# Patient Record
Sex: Female | Born: 1935 | Race: Black or African American | Hispanic: No | Smoking: Former smoker
Health system: Southern US, Community
[De-identification: ages and names within clinical notes are randomized; demographics above are authoritative.]

## PROBLEM LIST (undated history)

## (undated) DIAGNOSIS — G4730 Sleep apnea, unspecified: Secondary | ICD-10-CM

## (undated) DIAGNOSIS — I2699 Other pulmonary embolism without acute cor pulmonale: Secondary | ICD-10-CM

## (undated) DIAGNOSIS — K219 Gastro-esophageal reflux disease without esophagitis: Secondary | ICD-10-CM

## (undated) DIAGNOSIS — Z8719 Personal history of other diseases of the digestive system: Secondary | ICD-10-CM

## (undated) DIAGNOSIS — D510 Vitamin B12 deficiency anemia due to intrinsic factor deficiency: Secondary | ICD-10-CM

## (undated) DIAGNOSIS — E7800 Pure hypercholesterolemia, unspecified: Secondary | ICD-10-CM

## (undated) HISTORY — DX: Pure hypercholesterolemia, unspecified: E78.00

## (undated) HISTORY — DX: Vitamin B12 deficiency anemia due to intrinsic factor deficiency: D51.0

## (undated) HISTORY — DX: Other pulmonary embolism without acute cor pulmonale: I26.99

---

## 1982-07-26 HISTORY — PX: ABDOMINAL HYSTERECTOMY: SHX81

## 2003-07-27 HISTORY — PX: CHOLECYSTECTOMY: SHX55

## 2011-06-18 ENCOUNTER — Encounter (HOSPITAL_BASED_OUTPATIENT_CLINIC_OR_DEPARTMENT_OTHER): Payer: Self-pay

## 2011-06-25 ENCOUNTER — Ambulatory Visit (HOSPITAL_BASED_OUTPATIENT_CLINIC_OR_DEPARTMENT_OTHER): Payer: Medicare Other

## 2011-07-30 ENCOUNTER — Other Ambulatory Visit (HOSPITAL_COMMUNITY)
Admission: RE | Admit: 2011-07-30 | Discharge: 2011-07-30 | Disposition: A | Discharge status: Home / Self Care | Payer: Medicare Other | Source: Ambulatory Visit | Attending: Family Medicine | Admitting: Family Medicine

## 2011-07-30 DIAGNOSIS — Z1159 Encounter for screening for other viral diseases: Secondary | ICD-10-CM | POA: Insufficient documentation

## 2011-07-30 DIAGNOSIS — Z124 Encounter for screening for malignant neoplasm of cervix: Secondary | ICD-10-CM | POA: Insufficient documentation

## 2011-09-23 ENCOUNTER — Other Ambulatory Visit: Payer: Self-pay | Admitting: Gastroenterology

## 2011-09-29 ENCOUNTER — Inpatient Hospital Stay: Admission: RE | Admit: 2011-09-29 | Payer: Medicare Other | Source: Ambulatory Visit

## 2011-10-04 ENCOUNTER — Other Ambulatory Visit: Payer: Medicare Other

## 2011-10-05 ENCOUNTER — Ambulatory Visit
Admission: RE | Admit: 2011-10-05 | Discharge: 2011-10-05 | Disposition: A | Discharge status: Home / Self Care | Payer: Medicare Other | Source: Ambulatory Visit | Attending: Gastroenterology | Admitting: Gastroenterology

## 2013-03-22 ENCOUNTER — Encounter: Payer: Self-pay | Admitting: *Deleted

## 2013-03-23 ENCOUNTER — Encounter: Payer: Self-pay | Admitting: Internal Medicine

## 2013-03-23 ENCOUNTER — Ambulatory Visit (INDEPENDENT_AMBULATORY_CARE_PROVIDER_SITE_OTHER): Payer: Medicare Other | Admitting: Internal Medicine

## 2013-03-23 VITALS — BP 94/78 | HR 72 | Temp 98.5°F | Resp 18 | Ht 66.14 in | Wt 169.5 lb

## 2013-03-23 DIAGNOSIS — K219 Gastro-esophageal reflux disease without esophagitis: Secondary | ICD-10-CM | POA: Insufficient documentation

## 2013-03-23 DIAGNOSIS — R143 Flatulence: Secondary | ICD-10-CM

## 2013-03-23 DIAGNOSIS — E785 Hyperlipidemia, unspecified: Secondary | ICD-10-CM | POA: Insufficient documentation

## 2013-03-23 DIAGNOSIS — R141 Gas pain: Secondary | ICD-10-CM

## 2013-03-23 DIAGNOSIS — E663 Overweight: Secondary | ICD-10-CM | POA: Insufficient documentation

## 2013-03-23 DIAGNOSIS — K449 Diaphragmatic hernia without obstruction or gangrene: Secondary | ICD-10-CM

## 2013-03-23 DIAGNOSIS — R634 Abnormal weight loss: Secondary | ICD-10-CM | POA: Insufficient documentation

## 2013-03-23 NOTE — Progress Notes (Signed)
Patient ID: Alicia Smith, female   DOB: 1935-12-04, 77 y.o.   MRN: [id number] Location:  [facility] / [place] [facility]  Code Status: does not have advance directive  No Known Allergies  Chief Complaint  Patient presents with  . Establish Care    HPI: Patient is a 77 y.o. black female seen in the office today to establish with the practice.  She has not had many medical problems.  She was started on amlodipine which she since stopped.    She uses zantac prn for acid reflux.  See hpi below.  Has bubble in stomach.  Present since H pylori in stomach and treated with abx and probiotics.  Cscope with diverticulosis in March.  Still there after March.    She refuses all of her vaccines--flu, pneumonia, tetanus, shingles.    She smokes marijuana.  Last time was 2 wks ago.  Every couple mos with her friends.    Review of Systems:  Review of Systems  Constitutional: Positive for weight loss.       6 lbs since last visit with her previous doctor a month ago  HENT: Negative for hearing loss.   Eyes:       Dry eyes, sees Dr. Burna Cash  Respiratory: Negative for shortness of breath.   Cardiovascular: Negative for chest pain.  Gastrointestinal: Positive for heartburn, abdominal pain and diarrhea. Negative for nausea, vomiting, constipation, blood in stool and melena.       "bad bacteria in stomach"--cscope with mild diverticulosis, was put on probiotics and abx, has hiatal hernia, feels like not digesting her food--stopped fried foods, stools are loose and feels gassy, tried the new dulcolax for gas, feels like a bubble in her stomach  Musculoskeletal: Negative for falls.  Neurological: Negative for dizziness.  Endo/Heme/Allergies: Does not bruise/bleed easily.  Psychiatric/Behavioral: Negative for depression and memory loss. The patient is not nervous/anxious and does not have insomnia.      Past Medical History  Diagnosis Date  . High cholesterol     Past Surgical  History  Procedure Laterality Date  . Abdominal hysterectomy  1984    Dr. Gaynell Face  . Cholecystectomy  [address]    [place], [place]  . Cesarean section  66    [place]    Social History:   reports that she quit smoking about 30 years ago. Her smoking use included Cigarettes. She smoked 0.00 packs per day. She has never used smokeless tobacco. She reports that  drinks alcohol. She reports that she uses illicit drugs (Marijuana).  Family History  Problem Relation Age of Onset  . Cancer Mother   . Cancer Father   . Cancer Sister     breast  . Diabetes Sister   . Hypertension Sister   . Cancer Brother     prostate  . Kidney disease Sister   . Heart disease Brother     Medications: Patient's Medications  New Prescriptions   No medications on file  Previous Medications   AMLODIPINE (NORVASC) 5 MG TABLET    Take 5 mg by mouth daily.   NAPROXEN SODIUM (ANAPROX) 220 MG TABLET    Take 220 mg by mouth as needed.   RANITIDINE (ZANTAC) 150 MG TABLET    Take 150 mg by mouth as needed for heartburn.  Modified Medications   No medications on file  Discontinued Medications   No medications on file     Physical Exam: Filed Vitals:   03/23/13 0827  BP: 94/78  Pulse: 72  Temp: 98.5 F (36.9 C)  TempSrc: Oral  Resp: 18  Height: 5' 6.14" (1.68 m)  Weight: 169 lb 8 oz (76.885 kg)  Physical Exam  Constitutional: She is oriented to person, place, and time. She appears well-developed and well-nourished. No distress.  HENT:  Head: Normocephalic and atraumatic.  Right Ear: External ear normal.  Left Ear: External ear normal.  Nose: Nose normal.  Mouth/Throat: Oropharynx is clear and moist. No oropharyngeal exudate.  Eyes: EOM are normal. Pupils are equal, round, and reactive to light.  Neck: Normal range of motion. No tracheal deviation present. No thyromegaly present.  Cardiovascular: Normal rate, regular rhythm, normal heart sounds and intact distal pulses.   Pulmonary/Chest: Effort  normal and breath sounds normal. No respiratory distress.  Abdominal: Soft. Bowel sounds are normal. She exhibits no distension and no mass. There is no tenderness. There is no rebound and no guarding.  Midline cicatrix from hysterectomy--has subcutaneous air just superior to this and makes a glug sound when it is palpated  Musculoskeletal: Normal range of motion. She exhibits no edema and no tenderness.  Neurological: She is alert and oriented to person, place, and time. No cranial nerve deficit.  Skin: Skin is warm and dry.  Psychiatric: She has a normal mood and affect. Her behavior is normal. Judgment and thought content normal.   Labs reviewed: No recent available until records received Past Procedures: Reviewed EGD, cscope  Assessment/Plan 1. Excessive flatus - previously felt to be related to h pylori and inadequate good flora -has stopped her probiotics -due to subcutaneous air and pocket that fills with air and becomes uncomfortable, will check ultrasound for diagnosis - US Abdomen Complete; Future  2. GERD (gastroesophageal reflux disease) - f/u to check for clearance of h pylori - H. pylori Antibody, IgG - cont prn zantac 3. Hiatal hernia with gastroesophageal reflux -known, contributes to flatus - US Abdomen Complete; Future 4. Loss of weight - has changed her diet considerably--eats no red meat or fried foods now, but has lost over 100 lbs (said she was in the upper 200s going on 300 at one point) -cscope was remarkable only for diverticulosis--no polyps or masses identified, also had EGD and had barium study unremarkable except small esophageal web - Comprehensive metabolic panel - CBC with Differential - TSH  5. Hyperlipidemia LDL goal < 100 - Lipid panel  6. Overweight - wanted to check hba1c due to this and weight loss, but insurance would not cover.  Labs/tests ordered: cbc, cmp, flp, tsh, h pylori ab, abd [place], obtain records from prior pcp Next appt: 1  month

## 2013-03-28 ENCOUNTER — Ambulatory Visit
Admission: RE | Admit: 2013-03-28 | Discharge: 2013-03-28 | Disposition: A | Discharge status: Home / Self Care | Payer: Medicare Other | Source: Ambulatory Visit | Attending: Internal Medicine | Admitting: Internal Medicine

## 2013-03-28 DIAGNOSIS — R143 Flatulence: Secondary | ICD-10-CM

## 2013-03-28 DIAGNOSIS — K219 Gastro-esophageal reflux disease without esophagitis: Secondary | ICD-10-CM

## 2013-03-28 DIAGNOSIS — R634 Abnormal weight loss: Secondary | ICD-10-CM

## 2013-03-28 LAB — COMPREHENSIVE METABOLIC PANEL
ALT: 5 IU/L (ref 0–32)
AST: 10 IU/L (ref 0–40)
Albumin/Globulin Ratio: 1.7 (ref 1.1–2.5)
Albumin: 4.2 g/dL (ref 3.5–4.8)
Alkaline Phosphatase: 55 IU/L (ref 39–117)
BUN/Creatinine Ratio: 14 (ref 11–26)
BUN: 19 mg/dL (ref 8–27)
CO2: 19 mmol/L (ref 18–29)
Calcium: 9.1 mg/dL (ref 8.6–10.2)
Chloride: 106 mmol/L (ref 97–108)
Creatinine, Ser: 1.34 mg/dL — ABNORMAL HIGH (ref 0.57–1.00)
GFR calc Af Amer: 45 mL/min/{1.73_m2} — ABNORMAL LOW (ref >59)
GFR calc non Af Amer: 39 mL/min/{1.73_m2} — ABNORMAL LOW (ref >59)
Globulin, Total: 2.5 g/dL (ref 1.5–4.5)
Glucose: 78 mg/dL (ref 65–99)
Potassium: 4 mmol/L (ref 3.5–5.2)
Sodium: 141 mmol/L (ref 134–144)
Total Bilirubin: 0.2 mg/dL (ref 0.0–1.2)
Total Protein: 6.7 g/dL (ref 6.0–8.5)

## 2013-03-28 LAB — CBC WITH DIFFERENTIAL/PLATELET
Basophils Absolute: 0 10*3/uL (ref 0.0–0.2)
Basos: 1 % (ref 0–3)
Eos: 3 % (ref 0–5)
Eosinophils Absolute: 0.2 10*3/uL (ref 0.0–0.4)
HCT: 36.8 % (ref 34.0–46.6)
Hemoglobin: 11.9 g/dL (ref 11.1–15.9)
Immature Grans (Abs): 0 10*3/uL (ref 0.0–0.1)
Immature Granulocytes: 0 % (ref 0–2)
Lymphocytes Absolute: 2.3 10*3/uL (ref 0.7–3.1)
Lymphs: 41 % (ref 14–46)
MCH: 26.4 pg — ABNORMAL LOW (ref 26.6–33.0)
MCHC: 32.3 g/dL (ref 31.5–35.7)
MCV: 82 fL (ref 79–97)
Monocytes Absolute: 0.3 10*3/uL (ref 0.1–0.9)
Monocytes: 6 % (ref 4–12)
Neutrophils Absolute: 2.8 10*3/uL (ref 1.4–7.0)
Neutrophils Relative %: 49 % (ref 40–74)
RBC: 4.51 x10E6/uL (ref 3.77–5.28)
RDW: 15.9 % — ABNORMAL HIGH (ref 12.3–15.4)
WBC: 5.6 10*3/uL (ref 3.4–10.8)

## 2013-03-28 LAB — LIPID PANEL
Chol/HDL Ratio: 3.7 ratio units (ref 0.0–4.4)
Cholesterol, Total: 154 mg/dL (ref 100–199)
HDL: 42 mg/dL (ref >39)
LDL Calculated: 94 mg/dL (ref 0–99)
Triglycerides: 92 mg/dL (ref 0–149)
VLDL Cholesterol Cal: 18 mg/dL (ref 5–40)

## 2013-03-28 LAB — TSH: TSH: 1.88 u[IU]/mL (ref 0.450–4.500)

## 2013-03-28 LAB — H. PYLORI ANTIBODY, IGG: H Pylori IgG: 2 U/mL — ABNORMAL HIGH (ref 0.0–0.8)

## 2013-03-29 ENCOUNTER — Telehealth: Payer: Self-pay

## 2013-03-29 MED ORDER — AMOXICILL-CLARITHRO-LANSOPRAZ PO MISC: Freq: Two times a day (BID) | ORAL | Status: DC

## 2013-03-29 NOTE — Telephone Encounter (Signed)
Discussed labs with patient, patient verbalized understanding of results. Patient with pending appointment to f/u on 04/23/2013, RX sent to pharmacy

## 2013-03-29 NOTE — Telephone Encounter (Signed)
Message copied by Maurice Small on Thu Mar 29, 2013  2:04 PM ------      Message from: [place], [place] L      Created: Thu Mar 29, 2013  1:02 PM       H Pylori stomach bacteria test remains elevated despite prior treatment.  Will reinitiate this due to her ongoing stomach problems.  She needs to complete the entire treatment and not miss doses in order for it to work properly--prevpac (lansoprazole/amoxicillin/clarithromycin) 1 dose twice a day for 14 days as in pac instructions.  Avoid probiotics while taking this. ------

## 2013-04-03 ENCOUNTER — Telehealth: Payer: Self-pay

## 2013-04-03 MED ORDER — CLARITHROMYCIN 500 MG PO TABS: 500.0000 mg | ORAL_TABLET | Freq: Two times a day (BID) | ORAL | Status: DC

## 2013-04-03 MED ORDER — LANSOPRAZOLE 30 MG PO CPDR: 30.0000 mg | DELAYED_RELEASE_CAPSULE | Freq: Two times a day (BID) | ORAL | Status: DC

## 2013-04-03 MED ORDER — AMOXICILLIN 500 MG PO TABS: ORAL_TABLET | ORAL | Status: DC

## 2013-04-03 NOTE — Telephone Encounter (Signed)
Left message on voicemail informing patient rx's sent in individually.

## 2013-04-03 NOTE — Telephone Encounter (Signed)
The individual meds can all be provided separately but taken like a prevpak.  I don't know if they will be covered individually either.

## 2013-04-03 NOTE — Telephone Encounter (Signed)
Patient called indicating that she is still unable to get Prevpac [employer] will not cover), patient would like to know if Dr.Reed will change her to something else that will cure H.Pylori. Patient indicates in the past when Dr.Mann (GI doctor) rx'ed Prevpak she had the same situation where the insurance company would not cover rx. Please advise

## 2013-04-12 ENCOUNTER — Telehealth: Payer: Self-pay | Admitting: *Deleted

## 2013-04-12 NOTE — Telephone Encounter (Signed)
Patient Notified

## 2013-04-12 NOTE — Telephone Encounter (Signed)
Patient called and stated that she is burning in her vagina area and it is from taking the antibiotics. She stated that she does this everytime she takes them.

## 2013-04-23 ENCOUNTER — Ambulatory Visit (INDEPENDENT_AMBULATORY_CARE_PROVIDER_SITE_OTHER): Payer: Medicare Other | Admitting: Internal Medicine

## 2013-04-23 ENCOUNTER — Encounter: Payer: Self-pay | Admitting: Internal Medicine

## 2013-04-23 VITALS — BP 108/70 | HR 79 | Temp 98.3°F | Wt 163.0 lb

## 2013-04-23 DIAGNOSIS — K449 Diaphragmatic hernia without obstruction or gangrene: Secondary | ICD-10-CM

## 2013-04-23 DIAGNOSIS — K219 Gastro-esophageal reflux disease without esophagitis: Secondary | ICD-10-CM

## 2013-04-23 DIAGNOSIS — K2970 Gastritis, unspecified, without bleeding: Secondary | ICD-10-CM

## 2013-04-23 DIAGNOSIS — R634 Abnormal weight loss: Secondary | ICD-10-CM

## 2013-04-23 MED ORDER — LANSOPRAZOLE 30 MG PO CPDR: 30.0000 mg | DELAYED_RELEASE_CAPSULE | Freq: Every day | ORAL | Status: DC

## 2013-04-23 NOTE — Progress Notes (Signed)
Patient ID: Alicia Smith, female   DOB: [birth year], 77 y.o.   MRN: [id number] Location:  [facility] / [facility]  No Known Allergies  Chief Complaint  Patient presents with  . Medical Managment of Chronic Issues    1 month follow-up  . Weight Loss    pt down 6 lbs from last visit 1 month ago  . GI Problem    ongoing concerns- H-Pylori  . Leg Problem    knots/raised areas on RT leg    HPI: Patient is a 77 y.o. black female seen in the office today for medical management of chronic issues.   Pt. Ate breakfast yesterday morning and took her Prilosec then for lunch she had [employer] grilled chicken and it made her sick and she felt better after she threw up. This has happened twice this month. Feels that her food isn't digesting. Pt. Took all her medicine that was prescribed with prevpak.  After starting medication she had a BM and it was solid but now its is back to loose. Air bubbles that used to be in her stomach is getting better. Considered that she has continue to lose weight. Has lost 6lbs in the last month. States that her appetite is good except for the two times she vomited. States she can tell when her food isn't digesting because she belches and her stomach swells like a basketball. This do not happen every time she eats. States she when she ate tomatoes it made her stomach burn. Stomach doesn't hurt every time she eats.    Review of Systems:  Review of Systems  Constitutional: Positive for weight loss.  Respiratory: Negative for shortness of breath.   Cardiovascular: Negative for chest pain.  Gastrointestinal: Positive for vomiting, abdominal pain and diarrhea. Negative for blood in stool.       See HPI  Psychiatric/Behavioral: Negative for depression.    Past Medical History  Diagnosis Date  . High cholesterol     Past Surgical History  Procedure Laterality Date  . Abdominal hysterectomy  1984    Dr. Gaynell Face  . Cholecystectomy  [address]    [place], [place]  . Cesarean section  15    [place]    Social History:   reports that she quit smoking about 30 years ago. Her smoking use included Cigarettes. She smoked 0.00 packs per day. She has never used smokeless tobacco. She reports that  drinks alcohol. She reports that she uses illicit drugs (Marijuana).  Family History  Problem Relation Age of Onset  . Cancer Mother   . Cancer Father   . Cancer Sister     breast  . Diabetes Sister   . Hypertension Sister   . Cancer Brother     prostate  . Kidney disease Sister   . Heart disease Brother     Medications: Patient's Medications  New Prescriptions   No medications on file  Previous Medications   AMLODIPINE (NORVASC) 5 MG TABLET    Take 5 mg by mouth daily.   LANSOPRAZOLE (PREVACID) 30 MG CAPSULE    Take 1 capsule (30 mg total) by mouth 2 (two) times daily.   NAPROXEN SODIUM (ANAPROX) 220 MG TABLET    Take 220 mg by mouth as needed.  Modified Medications   No medications on file  Discontinued Medications   AMOXICILLIN (AMOXIL) 500 MG TABLET    2 by mouth twice daily   CLARITHROMYCIN (BIAXIN) 500 MG TABLET  Take 1 tablet (500 mg total) by mouth 2 (two) times daily.   RANITIDINE (ZANTAC) 150 MG TABLET    Take 150 mg by mouth as needed for heartburn.     Physical Exam: Filed Vitals:   04/23/13 1029  BP: 108/70  Pulse: 79  Temp: 98.3 F (36.8 C)  TempSrc: Oral  Weight: 163 lb (73.936 kg)  SpO2: 99%   Physical Exam  Constitutional: She is oriented to person, place, and time. She appears well-developed and well-nourished.  Cardiovascular: Normal rate, regular rhythm, normal heart sounds and intact distal pulses.   Pulmonary/Chest: Effort normal and breath sounds normal.  Abdominal: Soft. Bowel sounds are normal. She exhibits no distension and no mass. There is no tenderness.  Neurological: She is alert and oriented to person, place, and time.  Skin: Skin is warm and dry.  Psychiatric: She has a normal mood and  affect.   Labs reviewed: Basic Metabolic Panel:  Recent Labs  96/04/54 1035  NA 141  K 4.0  CL 106  CO2 19  GLUCOSE 78  BUN 19  CREATININE 1.34*  CALCIUM 9.1  TSH 1.880   Liver Function Tests:  Recent Labs  03/23/13 1035  AST 10  ALT 5  ALKPHOS 55  BILITOT 0.2  PROT 6.7  CBC:  Recent Labs  03/23/13 1035  WBC 5.6  NEUTROABS 2.8  HGB 11.9  HCT 36.8  MCV 82   Lipid Panel:  Recent Labs  03/23/13 1035  HDL 42  LDLCALC 94  TRIG 92  CHOLHDL 3.7   Assessment/Plan 1. Gastritis - has improved significantly since treatment with prev-pak, but still has episodes of diarrhea and severe indigestion (these seem appropriate based on eating the wrong things--[employer] and then tomatoes) - f/u  H. pylori Antibody, IgG post treatment - Ambulatory referral to Gastroenterology--for repeat EGD--does not want to return to Dr. Loreta Ave  2. Loss of weight - Ambulatory referral to Gastroenterology - this may simply be due to her dramatic changes in her diet (much healthier now); however, I don't want to miss something serious so will r/o myeloma with spep, upep due to elevated creatinine - Protein electrophoresis, serum - Protein Electrophoresis, Urine Rflx.  3. Hiatal hernia with gastroesophageal reflux - Ambulatory referral to Gastroenterology  Labs/tests ordered: h pylori retest, spep, upep Next appt:  3 mos

## 2013-04-25 LAB — PROTEIN ELECTROPHORESIS, SERUM
A/G Ratio: 1.3 (ref 0.7–2.0)
Albumin ELP: 4.3 g/dL (ref 3.2–5.6)
Alpha 1: 0.2 g/dL (ref 0.1–0.4)
Alpha 2: 0.8 g/dL (ref 0.4–1.2)
Beta: 0.8 g/dL (ref 0.6–1.3)
Gamma Globulin: 1.5 g/dL (ref 0.5–1.6)
Globulin, Total: 3.3 g/dL (ref 2.0–4.5)
M-Spike, %: 0.7 g/dL — ABNORMAL HIGH
Total Protein: 7.6 g/dL (ref 6.0–8.5)

## 2013-04-25 LAB — PROTEIN ELECTROPHORESIS, URINE REFLEX
Albumin ELP, Urine: 16.3 %
Alpha-1-Globulin, U: 1.4 %
Alpha-2-Globulin, U: 10.5 %
Beta Globulin, U: 32.4 %
Gamma Globulin, U: 39.5 %
Protein, Ur: 30 mg/dL — ABNORMAL HIGH (ref 0.0–15.0)

## 2013-04-25 LAB — H. PYLORI ANTIBODY, IGG: H Pylori IgG: 2.1 U/mL — ABNORMAL HIGH (ref 0.0–0.8)

## 2013-04-27 ENCOUNTER — Telehealth: Payer: Self-pay | Admitting: *Deleted

## 2013-04-27 NOTE — Telephone Encounter (Signed)
Patient states that she is having burning in her vaginal and rectal area.

## 2013-04-27 NOTE — Telephone Encounter (Signed)
I recommend she try over the counter yeast infection treatment like monistat to see if this improves her symptoms over the weekend.  It may take a week to get better.

## 2013-04-27 NOTE — Telephone Encounter (Signed)
Discussed with patient, patient verbalized understanding. Patient aware if symptoms persist or progress to schedule appointment for evaluation.

## 2013-05-02 ENCOUNTER — Other Ambulatory Visit: Payer: Self-pay | Admitting: Gastroenterology

## 2013-05-02 DIAGNOSIS — R140 Abdominal distension (gaseous): Secondary | ICD-10-CM

## 2013-05-02 DIAGNOSIS — R634 Abnormal weight loss: Secondary | ICD-10-CM

## 2013-05-09 ENCOUNTER — Other Ambulatory Visit: Payer: Medicare Other

## 2013-05-15 ENCOUNTER — Ambulatory Visit
Admission: RE | Admit: 2013-05-15 | Discharge: 2013-05-15 | Disposition: A | Discharge status: Home / Self Care | Payer: Medicare Other | Source: Ambulatory Visit | Attending: Gastroenterology | Admitting: Gastroenterology

## 2013-05-15 ENCOUNTER — Inpatient Hospital Stay (HOSPITAL_COMMUNITY)
Admission: EM | Admit: 2013-05-15 | Discharge: 2013-05-19 | DRG: 395 | Disposition: A | Discharge status: Home / Self Care | Payer: Medicare Other | Attending: General Surgery | Admitting: General Surgery

## 2013-05-15 ENCOUNTER — Encounter (HOSPITAL_COMMUNITY): Payer: Self-pay | Admitting: Emergency Medicine

## 2013-05-15 DIAGNOSIS — K3189 Other diseases of stomach and duodenum: Secondary | ICD-10-CM | POA: Diagnosis present

## 2013-05-15 DIAGNOSIS — R140 Abdominal distension (gaseous): Secondary | ICD-10-CM

## 2013-05-15 DIAGNOSIS — E7800 Pure hypercholesterolemia, unspecified: Secondary | ICD-10-CM | POA: Diagnosis present

## 2013-05-15 DIAGNOSIS — K219 Gastro-esophageal reflux disease without esophagitis: Secondary | ICD-10-CM | POA: Diagnosis present

## 2013-05-15 DIAGNOSIS — R634 Abnormal weight loss: Secondary | ICD-10-CM

## 2013-05-15 DIAGNOSIS — K668 Other specified disorders of peritoneum: Secondary | ICD-10-CM

## 2013-05-15 DIAGNOSIS — R197 Diarrhea, unspecified: Secondary | ICD-10-CM | POA: Diagnosis present

## 2013-05-15 DIAGNOSIS — Z87891 Personal history of nicotine dependence: Secondary | ICD-10-CM

## 2013-05-15 HISTORY — DX: Sleep apnea, unspecified: G47.30

## 2013-05-15 HISTORY — DX: Gastro-esophageal reflux disease without esophagitis: K21.9

## 2013-05-15 HISTORY — DX: Personal history of other diseases of the digestive system: Z87.19

## 2013-05-15 LAB — CBC WITH DIFFERENTIAL/PLATELET
Basophils Absolute: 0 10*3/uL (ref 0.0–0.1)
Basophils Relative: 1 % (ref 0–1)
Eosinophils Absolute: 0.1 10*3/uL (ref 0.0–0.7)
HCT: 34.3 % — ABNORMAL LOW (ref 36.0–46.0)
Hemoglobin: 11.2 g/dL — ABNORMAL LOW (ref 12.0–15.0)
Lymphs Abs: 2.4 10*3/uL (ref 0.7–4.0)
MCH: 26.8 pg (ref 26.0–34.0)
MCHC: 32.7 g/dL (ref 30.0–36.0)
MCV: 82.1 fL (ref 78.0–100.0)
Monocytes Absolute: 0.3 10*3/uL (ref 0.1–1.0)
Monocytes Relative: 6 % (ref 3–12)
Neutro Abs: 2.9 10*3/uL (ref 1.7–7.7)
Neutrophils Relative %: 50 % (ref 43–77)
RBC: 4.18 MIL/uL (ref 3.87–5.11)
RDW: 15.5 % (ref 11.5–15.5)
WBC: 5.7 10*3/uL (ref 4.0–10.5)

## 2013-05-15 LAB — COMPREHENSIVE METABOLIC PANEL
AST: 16 U/L (ref 0–37)
Albumin: 3.6 g/dL (ref 3.5–5.2)
BUN: 16 mg/dL (ref 6–23)
Calcium: 8.9 mg/dL (ref 8.4–10.5)
Chloride: 106 mEq/L (ref 96–112)
Creatinine, Ser: 1.02 mg/dL (ref 0.50–1.10)
Total Bilirubin: 0.3 mg/dL (ref 0.3–1.2)
Total Protein: 7 g/dL (ref 6.0–8.3)

## 2013-05-15 MED ORDER — DEXTROSE-NACL 5-0.9 % IV SOLN
INTRAVENOUS | Status: DC
  Administered 2013-05-15: 75 mL via INTRAVENOUS

## 2013-05-15 MED ORDER — ENOXAPARIN SODIUM 40 MG/0.4ML ~~LOC~~ SOLN
40.0000 mg | SUBCUTANEOUS | Status: DC
  Administered 2013-05-15 – 2013-05-18 (×4): 40 mg via SUBCUTANEOUS
  Filled 2013-05-15 (×6): qty 0.4

## 2013-05-15 MED ORDER — LIDOCAINE HCL 2 % EX GEL
Freq: Once | CUTANEOUS | Status: AC
  Administered 2013-05-15: 20
  Filled 2013-05-15: qty 20

## 2013-05-15 MED ORDER — MORPHINE SULFATE 2 MG/ML IJ SOLN
2.0000 mg | INTRAMUSCULAR | Status: DC | PRN
  Filled 2013-05-15: qty 1

## 2013-05-15 MED ORDER — ONDANSETRON HCL 4 MG/2ML IJ SOLN
4.0000 mg | Freq: Four times a day (QID) | INTRAMUSCULAR | Status: DC | PRN
  Filled 2013-05-15: qty 2

## 2013-05-15 MED ORDER — PANTOPRAZOLE SODIUM 40 MG IV SOLR
40.0000 mg | Freq: Every day | INTRAVENOUS | Status: DC
  Administered 2013-05-15 – 2013-05-18 (×4): 40 mg via INTRAVENOUS
  Filled 2013-05-15 (×6): qty 40

## 2013-05-15 MED ORDER — IOHEXOL 300 MG/ML  SOLN
100.0000 mL | Freq: Once | INTRAMUSCULAR | Status: AC | PRN
  Administered 2013-05-15: 100 mL via INTRAVENOUS

## 2013-05-15 NOTE — H&P (Signed)
Chief Complaint: peritoneal free air on CT scan  HPI: Alicia Smith is a 77 year old female who presented to [facility] following an outpatient CT scan that was ordered by her gastroenterologist whom she saw last week. Due to findings of free air, she was referred to the ED. The patient states that over 1 year she has been having reflux type symptoms and "gurgling" or "bubbling" which she can feel move when she pushes on the abdomen.  She had an upper endoscopy with Dr. Loreta Ave which was negative.  She was treated for H. Pylori with a prev pack and started on zantac.  She had a colonoscopy in April which she states was normal once again.  Due to ongoing symptoms, she sought a second opinion.  She was seen by Dr. Bosie Clos about 1 week ago.  The patient denies abdominal pain.  Denies fever, chills or sweats.  She complains of loose stools.  Her appetite is adequate, but reports 100lb weight loss over the past year.She denies significant family history such as gastric or colon cancer.  She does not take any blood thinners. Previous abdominal history includes a laparoscopic cholecystectomy in 2005, hysterectomy in 1984 for uterine fibroids and a c-section.     Past Medical History  Diagnosis Date  . High cholesterol     Past Surgical History  Procedure Laterality Date  . Abdominal hysterectomy  1984    Dr. Gaynell Face  . Cholecystectomy  [address]    [place], [place]  . Cesarean section  3    [place]    Family History  Problem Relation Age of Onset  . Cancer Mother   . Cancer Father   . Cancer Sister     breast  . Diabetes Sister   . Hypertension Sister   . Cancer Brother     prostate  . Kidney disease Sister   . Heart disease Brother    Social History:  reports that she quit smoking about 30 years ago. Her smoking use included Cigarettes. She smoked 0.00 packs per day. She has never used smokeless tobacco. She reports that she drinks alcohol. She reports that she uses illicit drugs  (Marijuana).  Allergies: No Known Allergies   (Not in a hospital admission)  Results for orders placed during the hospital encounter of 05/15/13 (from the past 48 hour(s))  CBC WITH DIFFERENTIAL     Status: Abnormal   Collection Time    05/15/13  1:52 PM      Result Value Range   WBC 5.7  4.0 - 10.5 K/uL   RBC 4.18  3.87 - 5.11 MIL/uL   Hemoglobin 11.2 (*) 12.0 - 15.0 g/dL   HCT 13.0 (*) 86.5 - 78.4 %   MCV 82.1  78.0 - 100.0 fL   MCH 26.8  26.0 - 34.0 pg   MCHC 32.7  30.0 - 36.0 g/dL   RDW 69.6  29.5 - 28.4 %   Platelets 158  150 - 400 K/uL   Neutrophils Relative % 50  43 - 77 %   Neutro Abs 2.9  1.7 - 7.7 K/uL   Lymphocytes Relative 42  12 - 46 %   Lymphs Abs 2.4  0.7 - 4.0 K/uL   Monocytes Relative 6  3 - 12 %   Monocytes Absolute 0.3  0.1 - 1.0 K/uL   Eosinophils Relative 2  0 - 5 %   Eosinophils Absolute 0.1  0.0 - 0.7 K/uL   Basophils Relative 1  0 -  1 %   Basophils Absolute 0.0  0.0 - 0.1 K/uL  COMPREHENSIVE METABOLIC PANEL     Status: Abnormal   Collection Time    05/15/13  1:52 PM      Result Value Range   Sodium 139  135 - 145 mEq/L   Potassium 3.4 (*) 3.5 - 5.1 mEq/L   Chloride 106  96 - 112 mEq/L   CO2 21  19 - 32 mEq/L   Glucose, Bld 85  70 - 99 mg/dL   BUN 16  6 - 23 mg/dL   Creatinine, Ser 1.19  0.50 - 1.10 mg/dL   Calcium 8.9  8.4 - 14.7 mg/dL   Total Protein 7.0  6.0 - 8.3 g/dL   Albumin 3.6  3.5 - 5.2 g/dL   AST 16  0 - 37 U/L   ALT 9  0 - 35 U/L   Alkaline Phosphatase 57  39 - 117 U/L   Total Bilirubin 0.3  0.3 - 1.2 mg/dL   GFR calc non Af Amer 52 (*) >90 mL/min   GFR calc Af Amer 61 (*) >90 mL/min   Comment: (NOTE)     The eGFR has been calculated using the CKD EPI equation.     This calculation has not been validated in all clinical situations.     eGFR's persistently <90 mL/min signify possible Chronic Kidney     Disease.   Ct Abdomen Pelvis W Contrast  05/15/2013   CLINICAL DATA:  Weight loss, bloating, history of H pylori  EXAM: CT  ABDOMEN AND PELVIS WITH CONTRAST  TECHNIQUE: Multidetector CT imaging of the abdomen and pelvis was performed using the standard protocol following bolus administration of intravenous contrast.  CONTRAST:  OMNIPAQUE IOHEXOL 300 MG/ML  SOLN  COMPARISON:  None.  BUN and creatinine were obtained on site at [facility] at [address]Results: BUN 15 mg/dL, Creatinine 1.0 mg/dL.  FINDINGS: The lung bases are clear other than a small bleb in the posterior right lower lobe. However, there is a large amount of free intraperitoneal air present. This amount of air does suggest the possibility of a perforated viscus. However, the patient is not in acute pain according to the CT technologist. The liver enhances with no focal abnormality and no ductal dilatation is seen. Surgical clips are present from prior cholecystectomy. There is a small amount of ascites primarily perihepatic in location. The pancreas is normal in size and the pancreatic duct is not dilated. The adrenal glands and spleen are unremarkable, with a small amount of perisplenic fluid as well. The stomach is not well distended, but distally there is suggestion of a soft tissue mass within the antrum of the stomach. However, on kidney delayed images, this "mass" does not persist and therefore a gastric ulceration and perforation would be the primary consideration in view of the amount of free air present. Endoscopy therefore is recommended.  The kidneys enhance and there is a cyst emanating from the lateral left kidney of approximately 6.4 cm in maximum diameter. On delayed images, the pelvocaliceal systems are unremarkable and the proximal ureters are normal in caliber. The abdominal aorta is normal in caliber. The mesenteric vasculature appears patent. No definite adenopathy is seen.  Free air continues into the pelvis and does enter the umbilicus. There is some free fluid layering within the pelvis as well. A large amount of air is noted in  the rectum, and there are multiple diverticula present. A perforated diverticulum could  create free air, but this amount of free air which suggest a gastric etiology as the more likely possibility. Contrast fills much of the small bowel which is unremarkable. Some of the distal small bowel loops are slightly prominent but no definite point of obstruction is noted. The urinary bladder is decompressed. The lumbar vertebrae are normal alignment.  IMPRESSION: 1. Large amount of free intraperitoneal air suggesting a perforated viscus. The patient is not at this time complaining of significant abdominal pain. 2. Initially there was a question of a possible soft tissue mass in the antrum of the stomach, but this does not appear to persist on delayed images. However in view of the amount of free air, a perforated gastric ulcer or tumor would be the primary consideration. Consider endoscopy. 3. Multiple colonic diverticula primarily in the rectosigmoid and descending colon. No definite diverticulitis is seen. 4. Small to moderate amount of ascites within the peritoneal cavity.   Electronically Signed   By: Dwyane Dee M.D.   On: 05/15/2013 11:56    Review of Systems  Constitutional: Positive for weight loss. Negative for fever, chills, malaise/fatigue and diaphoresis.  Respiratory: Negative for shortness of breath.   Cardiovascular: Negative for chest pain and palpitations.  Gastrointestinal: Negative for heartburn, nausea, vomiting, abdominal pain, diarrhea, constipation, blood in stool and melena.  Neurological: Negative for weakness.    Blood pressure 129/72, pulse 87, temperature 97.7 F (36.5 C), temperature source Oral, resp. rate 18, height 5\' 6"  (1.676 m), weight 170 lb (77.111 kg), SpO2 97.00%. Physical Exam  Constitutional: She is oriented to person, place, and time. She appears well-developed and well-nourished. No distress.  Neck: Normal range of motion. Neck supple.  Cardiovascular: Normal rate,  regular rhythm, normal heart sounds and intact distal pulses.  Exam reveals no gallop and no friction rub.   No murmur heard. Respiratory: Effort normal and breath sounds normal. No respiratory distress. She has no wheezes. She has no rales. She exhibits no tenderness.  GI: Soft. Bowel sounds are normal. She exhibits no distension and no mass. There is no tenderness. There is no rebound and no guarding.  Musculoskeletal: She exhibits no edema and no tenderness.  Lymphadenopathy:    She has no cervical adenopathy.  Neurological: She is alert and oriented to person, place, and time.  Skin: Skin is warm and dry. No rash noted. She is not diaphoretic. No erythema. No pallor.  Psychiatric: She has a normal mood and affect. Her behavior is normal. Judgment and thought content normal.     Assessment/Plan Peritoneal free air: etiology is unclear at present time.  She has a benign abdominal exam, without guarding or evidence of peritonitis.  She has stable vital signs and a normal white count. With this, there are no indications for urgent surgical intervention.  We will admit her for serial abdominal exam, NGT for decompression and IV hydration.  Start PPI.  There are no indications for antibiotics as well. We will repeat a CBC, BMP and abdominal X ray in the morning.  Dr. Johna Sheriff has spoken with Dr. Bosie Clos who will evaluate the patient and determine whether an endoscopy is indicated.   She will remain NPO.  We discussed this with the patient and she is agreeable with such plan of care.    Ashok Norris ANP-BC Pager [phone]  05/15/2013, 2:36 PM

## 2013-05-15 NOTE — ED Notes (Signed)
Pt reports that she had a CT done at [facility] and was told that she had free air in her abd. Reports that she has had some mild abd pain but reports gas and a bubbling in her stomach. States that the GI MD did a Upper GI on her about a year ago. Reports that Bosie Clos is her GI MD at this time.

## 2013-05-15 NOTE — ED Provider Notes (Signed)
CSN: [id number]     Arrival date & time 05/15/13  1257 History   First MD Initiated Contact with Patient 05/15/13 1307     Chief Complaint  Patient presents with  . Abdominal Pain   (Consider location/radiation/quality/duration/timing/severity/associated sxs/prior Treatment) Patient is a 77 y.o. female presenting with abdominal pain.  Abdominal Pain  Pt with history of H. Pylori infection who finished [facility] in the Spring of 2014 reports she has had several months of what she describes as bubbles gurgling in her abdomen and loose stools but no true pain. No fever and no vomiting. She had EGD and colonoscopy done in the Spring but states symptoms had been going on prior to that. She was sent for [place] recently which was unrevealing and the for outpatient CT today with results below showing large amount of free air of unclear etiology.   Past Medical History  Diagnosis Date  . High cholesterol    Past Surgical History  Procedure Laterality Date  . Abdominal hysterectomy  1984    Dr. Gaynell Face  . Cholecystectomy  [address]    [place], [place]  . Cesarean section  42    [place]   Family History  Problem Relation Age of Onset  . Cancer Mother   . Cancer Father   . Cancer Sister     breast  . Diabetes Sister   . Hypertension Sister   . Cancer Brother     prostate  . Kidney disease Sister   . Heart disease Brother    History  Substance Use Topics  . Smoking status: Former Smoker    Types: Cigarettes    Quit date: 07/26/1982  . Smokeless tobacco: Never Used  . Alcohol Use: Yes     Comment: Social   OB History   Grav Para Term Preterm Abortions TAB SAB Ect Mult Living                 Review of Systems  Gastrointestinal: Positive for abdominal pain.   All other systems reviewed and are negative except as noted in HPI.   Allergies  Review of patient's allergies indicates no known allergies.  Home Medications  No current outpatient prescriptions on file. BP 129/72  Pulse  87  Temp(Src) 97.7 F (36.5 C) (Oral)  Resp 18  Ht 5\' 6"  (1.676 m)  Wt 170 lb (77.111 kg)  BMI 27.45 kg/m2  SpO2 97% Physical Exam  Nursing note and vitals reviewed. Constitutional: She is oriented to person, place, and time. She appears well-developed and well-nourished.  HENT:  Head: Normocephalic and atraumatic.  Eyes: EOM are normal. Pupils are equal, round, and reactive to light.  Neck: Normal range of motion. Neck supple.  Cardiovascular: Normal rate, normal heart sounds and intact distal pulses.   Pulmonary/Chest: Effort normal and breath sounds normal.  Abdominal: Bowel sounds are normal. She exhibits no distension. There is no tenderness. There is no rebound and no guarding.  Musculoskeletal: Normal range of motion. She exhibits no edema and no tenderness.  Neurological: She is alert and oriented to person, place, and time. She has normal strength. No cranial nerve deficit or sensory deficit.  Skin: Skin is warm and dry. No rash noted.  Psychiatric: She has a normal mood and affect.    ED Course  Procedures (including critical care time) Labs Review Labs Reviewed  CBC WITH DIFFERENTIAL  COMPREHENSIVE METABOLIC PANEL   Imaging Review Ct Abdomen Pelvis W Contrast  05/15/2013   CLINICAL DATA:  Weight loss, bloating, history of H pylori  EXAM: CT ABDOMEN AND PELVIS WITH CONTRAST  TECHNIQUE: Multidetector CT imaging of the abdomen and pelvis was performed using the standard protocol following bolus administration of intravenous contrast.  CONTRAST:  OMNIPAQUE IOHEXOL 300 MG/ML  SOLN  COMPARISON:  None.  BUN and creatinine were obtained on site at [facility] at [address]Results: BUN 15 mg/dL, Creatinine 1.0 mg/dL.  FINDINGS: The lung bases are clear other than a small bleb in the posterior right lower lobe. However, there is a large amount of free intraperitoneal air present. This amount of air does suggest the possibility of a perforated viscus. However,  the patient is not in acute pain according to the CT technologist. The liver enhances with no focal abnormality and no ductal dilatation is seen. Surgical clips are present from prior cholecystectomy. There is a small amount of ascites primarily perihepatic in location. The pancreas is normal in size and the pancreatic duct is not dilated. The adrenal glands and spleen are unremarkable, with a small amount of perisplenic fluid as well. The stomach is not well distended, but distally there is suggestion of a soft tissue mass within the antrum of the stomach. However, on kidney delayed images, this "mass" does not persist and therefore a gastric ulceration and perforation would be the primary consideration in view of the amount of free air present. Endoscopy therefore is recommended.  The kidneys enhance and there is a cyst emanating from the lateral left kidney of approximately 6.4 cm in maximum diameter. On delayed images, the pelvocaliceal systems are unremarkable and the proximal ureters are normal in caliber. The abdominal aorta is normal in caliber. The mesenteric vasculature appears patent. No definite adenopathy is seen.  Free air continues into the pelvis and does enter the umbilicus. There is some free fluid layering within the pelvis as well. A large amount of air is noted in the rectum, and there are multiple diverticula present. A perforated diverticulum could create free air, but this amount of free air which suggest a gastric etiology as the more likely possibility. Contrast fills much of the small bowel which is unremarkable. Some of the distal small bowel loops are slightly prominent but no definite point of obstruction is noted. The urinary bladder is decompressed. The lumbar vertebrae are normal alignment.  IMPRESSION: 1. Large amount of free intraperitoneal air suggesting a perforated viscus. The patient is not at this time complaining of significant abdominal pain. 2. Initially there was a  question of a possible soft tissue mass in the antrum of the stomach, but this does not appear to persist on delayed images. However in view of the amount of free air, a perforated gastric ulcer or tumor would be the primary consideration. Consider endoscopy. 3. Multiple colonic diverticula primarily in the rectosigmoid and descending colon. No definite diverticulitis is seen. 4. Small to moderate amount of ascites within the peritoneal cavity.   Electronically Signed   By: Dwyane Dee M.D.   On: 05/15/2013 11:56    EKG Interpretation   None       MDM   1. Peritoneal free air     Vitals unremarkable. Abdomen benign, no peritoneal signs. Does not fit typical appearance of perforated viscus, but no recent procedure to account for free air either. Discussed with General Surgery who will come evaluate the patient.     Faigy Stretch B. Bernette Mayers, MD 05/15/13 4098

## 2013-05-15 NOTE — ED Notes (Signed)
Pt notified of need for NG tube. Pt was told by admitting MD that she could have pain medications prior to placement however IV attempts have been unsuccessful by this RN and natalie, rn. Will place tube when once IV is established.

## 2013-05-15 NOTE — H&P (Signed)
Patient interviewed and examined, agree with PA note above. Obviously an unusual presentation. Patient has over one year of chronic GI symptoms consisting of borborygmi, diarrhea some loss of appetite and weight loss. She is now found to have a large amount of free air in her abdomen on a routine outpatient CT scan with no acute change in her symptoms whatsoever.  Her abdomen is entirely benign on exam. For now I would recommend bowel rest and close observation. If she develops any clinical signs of peritonitis or infection would proceed with laparoscopy/laparotomy. Otherwise I would consider repeat upper endoscopy as it has been over one year. I have asked GI medicine to follow with [place]. Mariella Saa MD, FACS  05/15/2013 3:15 PM

## 2013-05-15 NOTE — ED Notes (Signed)
pts clothes, shoes, glasses, and purse taken with pt to floor.

## 2013-05-15 NOTE — ED Notes (Signed)
MD at bedside. 

## 2013-05-15 NOTE — ED Notes (Signed)
IV team returned page, will come attempt to gain access.

## 2013-05-15 NOTE — ED Notes (Signed)
Consulting MD at bedside

## 2013-05-15 NOTE — ED Notes (Signed)
Attempted to gain IV access X 2, unsuccessful. IV team.

## 2013-05-16 ENCOUNTER — Encounter (HOSPITAL_COMMUNITY): Payer: Self-pay | Admitting: General Practice

## 2013-05-16 ENCOUNTER — Inpatient Hospital Stay (HOSPITAL_COMMUNITY): Payer: Medicare Other

## 2013-05-16 DIAGNOSIS — K210 Gastro-esophageal reflux disease with esophagitis: Secondary | ICD-10-CM

## 2013-05-16 LAB — BASIC METABOLIC PANEL
BUN: 11 mg/dL (ref 6–23)
CO2: 21 mEq/L (ref 19–32)
Chloride: 107 mEq/L (ref 96–112)
GFR calc non Af Amer: 53 mL/min — ABNORMAL LOW (ref >90)
Glucose, Bld: 82 mg/dL (ref 70–99)
Potassium: 3.3 mEq/L — ABNORMAL LOW (ref 3.5–5.1)
Sodium: 140 mEq/L (ref 135–145)

## 2013-05-16 LAB — CBC
HCT: 32.3 % — ABNORMAL LOW (ref 36.0–46.0)
Hemoglobin: 10.8 g/dL — ABNORMAL LOW (ref 12.0–15.0)
MCH: 26.9 pg (ref 26.0–34.0)
MCHC: 33.4 g/dL (ref 30.0–36.0)
MCV: 80.3 fL (ref 78.0–100.0)
RBC: 4.02 MIL/uL (ref 3.87–5.11)
WBC: 4.6 10*3/uL (ref 4.0–10.5)

## 2013-05-16 MED ORDER — KCL IN DEXTROSE-NACL 20-5-0.9 MEQ/L-%-% IV SOLN
INTRAVENOUS | Status: DC
  Administered 2013-05-16: 75 mL via INTRAVENOUS
  Administered 2013-05-17 – 2013-05-18 (×3): via INTRAVENOUS
  Filled 2013-05-16 (×7): qty 1000

## 2013-05-16 MED ORDER — BIOTENE DRY MOUTH MT LIQD
15.0000 mL | Freq: Two times a day (BID) | OROMUCOSAL | Status: DC
  Administered 2013-05-16 – 2013-05-18 (×6): 15 mL via OROMUCOSAL

## 2013-05-16 MED ORDER — CHLORHEXIDINE GLUCONATE 0.12 % MT SOLN
15.0000 mL | Freq: Two times a day (BID) | OROMUCOSAL | Status: DC
Start: 2013-05-16 — End: 2013-05-18
  Administered 2013-05-16 – 2013-05-18 (×5): 15 mL via OROMUCOSAL
  Filled 2013-05-16 (×4): qty 15

## 2013-05-16 NOTE — Progress Notes (Signed)
Patient interviewed and examined, agree with PA note above. As noted the patient feels "fine" with no abdominal discomfort in her abdomen is absolutely nontender on exam.  Puzzling situation. I would be interested in GI opinion and consideration of upper endoscopy. We could consider diagnostic laparoscopy free air on her imaging does not resolve. Continue n.p.o. And NG for now and repeat abdominal x-rays tomorrow.  Mariella Saa MD, FACS  05/16/2013 9:19 AM

## 2013-05-16 NOTE — Progress Notes (Signed)
Subjective: No abdominal pain. Some post-prandial fullness and diarrhea, ongoing.  Objective: Vital signs in last 24 hours: Temp:  [98.1 F (36.7 C)-98.9 F (37.2 C)] 98.9 F (37.2 C) (10/22 0514) Pulse Rate:  [62-89] 62 (10/22 1313) Resp:  [16-18] 16 (10/22 1313) BP: (105-116)/(56-64) 109/63 mmHg (10/22 1313) SpO2:  [98 %-100 %] 99 % (10/22 1313) Weight change:  Last BM Date: 05/15/13  PE: GEN:  NAD ABD:  Soft, non-tender  Lab Results: CBC    Component Value Date/Time   WBC 4.6 05/16/2013 0455   WBC 5.6 03/23/2013 1035   RBC 4.02 05/16/2013 0455   RBC 4.51 03/23/2013 1035   HGB 10.8* 05/16/2013 0455   HCT 32.3* 05/16/2013 0455   PLT 168 05/16/2013 0455   MCV 80.3 05/16/2013 0455   MCH 26.9 05/16/2013 0455   MCH 26.4* 03/23/2013 1035   MCHC 33.4 05/16/2013 0455   MCHC 32.3 03/23/2013 1035   RDW 15.4 05/16/2013 0455   RDW 15.9* 03/23/2013 1035   LYMPHSABS 2.4 05/15/2013 1352   LYMPHSABS 2.3 03/23/2013 1035   MONOABS 0.3 05/15/2013 1352   EOSABS 0.1 05/15/2013 1352   EOSABS 0.2 03/23/2013 1035   BASOSABS 0.0 05/15/2013 1352   BASOSABS 0.0 03/23/2013 1035   CMP     Component Value Date/Time   NA 140 05/16/2013 0455   NA 141 03/23/2013 1035   K 3.3* 05/16/2013 0455   CL 107 05/16/2013 0455   CO2 21 05/16/2013 0455   GLUCOSE 82 05/16/2013 0455   GLUCOSE 78 03/23/2013 1035   BUN 11 05/16/2013 0455   BUN 19 03/23/2013 1035   CREATININE 1.01 05/16/2013 0455   CALCIUM 8.4 05/16/2013 0455   PROT 7.0 05/15/2013 1352   PROT 7.6 04/23/2013 1209   ALBUMIN 3.6 05/15/2013 1352   AST 16 05/15/2013 1352   ALT 9 05/15/2013 1352   ALKPHOS 57 05/15/2013 1352   BILITOT 0.3 05/15/2013 1352   GFRNONAA 53* 05/16/2013 0455   GFRAA 62* 05/16/2013 0455   Assessment:  1.  Free intraperitoneal air.  Unclear etiology.  Endoscopy over one year ago.  Colonoscopy about six months ago.  Very minimal symptoms.  Overall puzzling scenario.  Plan:  1.  Endoscopy tomorrow.  If endoscopy is  unrevealing, doubt utility of any further GI tract endoscopic evaluation. 2.  Next step in management, potentially to include exploratory laparotomy, is pending endoscopy findings. 3.  [facility] inpatient team to follow.   Alicia Smith M 05/16/2013, 4:03 PM  

## 2013-05-16 NOTE — Progress Notes (Signed)
Patient ID: Alicia Smith, female   DOB: Oct 24, 1935, 77 y.o.   MRN: [id number]   Subjective: Denies abdominal pain, nausea or vomiting.  Denies dysuria.  Has several BMs yesterday.  NGT to low intermittent suction.    Objective:  Vital signs:  Filed Vitals:   05/15/13 1732 05/15/13 1821 05/15/13 2120 05/16/13 0514  BP: 106/60 116/62 105/56 111/64  Pulse: 89 81 76 77  Temp: 98.1 F (36.7 C) 98.6 F (37 C) 98.6 F (37 C) 98.9 F (37.2 C)  TempSrc: Oral  Oral Oral  Resp: 18 16 17 17   Height:      Weight:      SpO2: 98% 98% 100% 100%    Last BM Date: 05/15/13  Intake/Output   Yesterday:  10/21 0701 - 10/22 0700 In: 873.8 [I.V.:873.8] Out: 250 [Emesis/NG output:250]      Physical Exam: General: Pt awake/alert/oriented x3 in no acute distress Chest: clear to auscultation bilaterally. No chest wall pain w good excursion CV:  s1s2 RRR no murmurs gallops or rubs.  +2 distal pulses, no edema.  MS: Normal AROM mjr joints.  No obvious deformity Abdomen:+BS.  Soft.  Nondistended.  No evidence of peritonitis.  No incarcerated hernias. Ext:  SCDs BLE.  No mjr edema.  No cyanosis Skin: No petechiae / purpura   Problem List:  Peritoneal free air   Results:   Labs: Results for orders placed during the hospital encounter of 05/15/13 (from the past 48 hour(s))  CBC WITH DIFFERENTIAL     Status: Abnormal   Collection Time    05/15/13  1:52 PM      Result Value Range   WBC 5.7  4.0 - 10.5 K/uL   RBC 4.18  3.87 - 5.11 MIL/uL   Hemoglobin 11.2 (*) 12.0 - 15.0 g/dL   HCT 40.9 (*) 81.1 - 91.4 %   MCV 82.1  78.0 - 100.0 fL   MCH 26.8  26.0 - 34.0 pg   MCHC 32.7  30.0 - 36.0 g/dL   RDW 78.2  95.6 - 21.3 %   Platelets 158  150 - 400 K/uL   Neutrophils Relative % 50  43 - 77 %   Neutro Abs 2.9  1.7 - 7.7 K/uL   Lymphocytes Relative 42  12 - 46 %   Lymphs Abs 2.4  0.7 - 4.0 K/uL   Monocytes Relative 6  3 - 12 %   Monocytes Absolute 0.3  0.1 - 1.0 K/uL   Eosinophils Relative  2  0 - 5 %   Eosinophils Absolute 0.1  0.0 - 0.7 K/uL   Basophils Relative 1  0 - 1 %   Basophils Absolute 0.0  0.0 - 0.1 K/uL  COMPREHENSIVE METABOLIC PANEL     Status: Abnormal   Collection Time    05/15/13  1:52 PM      Result Value Range   Sodium 139  135 - 145 mEq/L   Potassium 3.4 (*) 3.5 - 5.1 mEq/L   Chloride 106  96 - 112 mEq/L   CO2 21  19 - 32 mEq/L   Glucose, Bld 85  70 - 99 mg/dL   BUN 16  6 - 23 mg/dL   Creatinine, Ser 0.86  0.50 - 1.10 mg/dL   Calcium 8.9  8.4 - 57.8 mg/dL   Total Protein 7.0  6.0 - 8.3 g/dL   Albumin 3.6  3.5 - 5.2 g/dL   AST 16  0 - 37 U/L   ALT  9  0 - 35 U/L   Alkaline Phosphatase 57  39 - 117 U/L   Total Bilirubin 0.3  0.3 - 1.2 mg/dL   GFR calc non Af Amer 52 (*) >90 mL/min   GFR calc Af Amer 61 (*) >90 mL/min   Comment: (NOTE)     The eGFR has been calculated using the CKD EPI equation.     This calculation has not been validated in all clinical situations.     eGFR's persistently <90 mL/min signify possible Chronic Kidney     Disease.  BASIC METABOLIC PANEL     Status: Abnormal   Collection Time    05/16/13  4:55 AM      Result Value Range   Sodium 140  135 - 145 mEq/L   Potassium 3.3 (*) 3.5 - 5.1 mEq/L   Chloride 107  96 - 112 mEq/L   CO2 21  19 - 32 mEq/L   Glucose, Bld 82  70 - 99 mg/dL   BUN 11  6 - 23 mg/dL   Creatinine, Ser 1.61  0.50 - 1.10 mg/dL   Calcium 8.4  8.4 - 09.6 mg/dL   GFR calc non Af Amer 53 (*) >90 mL/min   GFR calc Af Amer 62 (*) >90 mL/min   Comment: (NOTE)     The eGFR has been calculated using the CKD EPI equation.     This calculation has not been validated in all clinical situations.     eGFR's persistently <90 mL/min signify possible Chronic Kidney     Disease.  CBC     Status: Abnormal   Collection Time    05/16/13  4:55 AM      Result Value Range   WBC 4.6  4.0 - 10.5 K/uL   RBC 4.02  3.87 - 5.11 MIL/uL   Hemoglobin 10.8 (*) 12.0 - 15.0 g/dL   HCT 04.5 (*) 40.9 - 81.1 %   MCV 80.3  78.0 -  100.0 fL   MCH 26.9  26.0 - 34.0 pg   MCHC 33.4  30.0 - 36.0 g/dL   RDW 91.4  78.2 - 95.6 %   Platelets 168  150 - 400 K/uL    Imaging / Studies: Ct Abdomen Pelvis W Contrast  05/15/2013   CLINICAL DATA:  Weight loss, bloating, history of H pylori  EXAM: CT ABDOMEN AND PELVIS WITH CONTRAST  TECHNIQUE: Multidetector CT imaging of the abdomen and pelvis was performed using the standard protocol following bolus administration of intravenous contrast.  CONTRAST:  OMNIPAQUE IOHEXOL 300 MG/ML  SOLN  COMPARISON:  None.  BUN and creatinine were obtained on site at [facility] at [address]Results: BUN 15 mg/dL, Creatinine 1.0 mg/dL.  FINDINGS: The lung bases are clear other than a small bleb in the posterior right lower lobe. However, there is a large amount of free intraperitoneal air present. This amount of air does suggest the possibility of a perforated viscus. However, the patient is not in acute pain according to the CT technologist. The liver enhances with no focal abnormality and no ductal dilatation is seen. Surgical clips are present from prior cholecystectomy. There is a small amount of ascites primarily perihepatic in location. The pancreas is normal in size and the pancreatic duct is not dilated. The adrenal glands and spleen are unremarkable, with a small amount of perisplenic fluid as well. The stomach is not well distended, but distally there is suggestion of a soft tissue mass within  the antrum of the stomach. However, on kidney delayed images, this "mass" does not persist and therefore a gastric ulceration and perforation would be the primary consideration in view of the amount of free air present. Endoscopy therefore is recommended.  The kidneys enhance and there is a cyst emanating from the lateral left kidney of approximately 6.4 cm in maximum diameter. On delayed images, the pelvocaliceal systems are unremarkable and the proximal ureters are normal in caliber. The  abdominal aorta is normal in caliber. The mesenteric vasculature appears patent. No definite adenopathy is seen.  Free air continues into the pelvis and does enter the umbilicus. There is some free fluid layering within the pelvis as well. A large amount of air is noted in the rectum, and there are multiple diverticula present. A perforated diverticulum could create free air, but this amount of free air which suggest a gastric etiology as the more likely possibility. Contrast fills much of the small bowel which is unremarkable. Some of the distal small bowel loops are slightly prominent but no definite point of obstruction is noted. The urinary bladder is decompressed. The lumbar vertebrae are normal alignment.  IMPRESSION: 1. Large amount of free intraperitoneal air suggesting a perforated viscus. The patient is not at this time complaining of significant abdominal pain. 2. Initially there was a question of a possible soft tissue mass in the antrum of the stomach, but this does not appear to persist on delayed images. However in view of the amount of free air, a perforated gastric ulcer or tumor would be the primary consideration. Consider endoscopy. 3. Multiple colonic diverticula primarily in the rectosigmoid and descending colon. No definite diverticulitis is seen. 4. Small to moderate amount of ascites within the peritoneal cavity.   Electronically Signed   By: Dwyane Dee M.D.   On: 05/15/2013 11:56   Dg Abd Acute W/chest  05/16/2013   CLINICAL DATA:  Free air on prior CT  EXAM: ACUTE ABDOMEN SERIES (ABDOMEN 2 VIEW & CHEST 1 VIEW)  COMPARISON:  05/15/2013  FINDINGS: The cardiac shadow is stable. The lungs are clear bilaterally. A nasogastric catheter is noted just beyond the gastroesophageal junction. A significant amount of free air is seen within the abdomen. Postsurgical changes are noted. Contrast material is seen within the colon with significant diverticulosis.  IMPRESSION: Significant persistent  free air suggestive of perforated viscus. This is stable in appearance from the prior exam.   Electronically Signed   By: Alcide Clever M.D.   On: 05/16/2013 07:56      Assessment/Plan Peritoneal free air Clinically she does not exhibit evidence of peritonitis.  Denies abdominal pain, WBC remains normal.  We will continue with NGT decompression, IVF hydration and await GI recommendations.  VTE prophylaxis -SCDs, lovenox, mobilize  GERD -Protonix  Ashok Norris, [facility] Surgery Pager [phone]) Office [phone]  05/16/2013 8:18 AM

## 2013-05-17 ENCOUNTER — Encounter (HOSPITAL_COMMUNITY): Admission: EM | Disposition: A | Discharge status: Home / Self Care | Payer: Self-pay | Source: Home / Self Care

## 2013-05-17 ENCOUNTER — Inpatient Hospital Stay (HOSPITAL_COMMUNITY): Payer: Medicare Other

## 2013-05-17 ENCOUNTER — Encounter (HOSPITAL_COMMUNITY): Payer: Self-pay | Admitting: Gastroenterology

## 2013-05-17 HISTORY — PX: ESOPHAGOGASTRODUODENOSCOPY: SHX5428

## 2013-05-17 LAB — BASIC METABOLIC PANEL
BUN: 9 mg/dL (ref 6–23)
Chloride: 111 mEq/L (ref 96–112)
Creatinine, Ser: 1.07 mg/dL (ref 0.50–1.10)
GFR calc non Af Amer: 49 mL/min — ABNORMAL LOW (ref >90)
Glucose, Bld: 92 mg/dL (ref 70–99)
Potassium: 3.4 mEq/L — ABNORMAL LOW (ref 3.5–5.1)
Sodium: 142 mEq/L (ref 135–145)

## 2013-05-17 LAB — CBC
HCT: 33.6 % — ABNORMAL LOW (ref 36.0–46.0)
Hemoglobin: 11.1 g/dL — ABNORMAL LOW (ref 12.0–15.0)
MCHC: 33 g/dL (ref 30.0–36.0)
MCV: 82 fL (ref 78.0–100.0)
RBC: 4.1 MIL/uL (ref 3.87–5.11)
RDW: 15.3 % (ref 11.5–15.5)

## 2013-05-17 SURGERY — EGD (ESOPHAGOGASTRODUODENOSCOPY)
Anesthesia: Moderate Sedation | Laterality: Left

## 2013-05-17 MED ORDER — SODIUM CHLORIDE 0.9 % IV SOLN
INTRAVENOUS | Status: DC
  Administered 2013-05-17: 500 mL via INTRAVENOUS

## 2013-05-17 MED ORDER — MIDAZOLAM HCL 10 MG/2ML IJ SOLN
INTRAMUSCULAR | Status: DC | PRN
  Administered 2013-05-17 (×4): 1 mg via INTRAVENOUS

## 2013-05-17 MED ORDER — FENTANYL CITRATE 0.05 MG/ML IJ SOLN
INTRAMUSCULAR | Status: DC | PRN
  Administered 2013-05-17 (×2): 25 ug via INTRAVENOUS

## 2013-05-17 MED ORDER — BUTAMBEN-TETRACAINE-BENZOCAINE 2-2-14 % EX AERO
INHALATION_SPRAY | CUTANEOUS | Status: DC | PRN
  Administered 2013-05-17: 2 via TOPICAL

## 2013-05-17 MED ORDER — FENTANYL CITRATE 0.05 MG/ML IJ SOLN
INTRAMUSCULAR | Status: AC
  Filled 2013-05-17: qty 2

## 2013-05-17 MED ORDER — MIDAZOLAM HCL 5 MG/ML IJ SOLN
INTRAMUSCULAR | Status: AC
  Filled 2013-05-17: qty 2

## 2013-05-17 NOTE — Op Note (Signed)
Alicia Smith [facility] [address] [place], [address]   ENDOSCOPY PROCEDURE REPORT  PATIENT: Alicia, Smith  MR#: [id number] BIRTHDATE: 10-22-1935 , 77  yrs. old GENDER: Female ENDOSCOPIST: Willis Modena, MD REFERRED BY:  Jaclynn Guarneri, MD PROCEDURE DATE:  05/17/2013 PROCEDURE:  Esophagogastroduodenoscopy (EGD) ASA CLASS:     ASA-II INDICATIONS:  Abdominal fullness, dyspepsia, abnormal imaging study (free intraperitoneal air; patient is clinically well without evidence of peritonitis). MEDICATIONS: Fentanyl 50 mcg IV, Versed 4 mg IV TOPICAL ANESTHETIC: Cetacaine spray x 2  DESCRIPTION OF PROCEDURE: After the risks benefits and alternatives of the procedure were thoroughly explained, informed consent was obtained.  The diagnostic forward-viewing endoscope was introduced through the mouth and advanced to the   . Without limitations.  The instrument was slowly withdrawn as the mucosa was fully examined.     Findings:  Normal esophagus.  Normal stomach and pylorus.  Normal duodenum to the second portion.            The scope was then withdrawn from the patient and the procedure completed.  ENDOSCOPIC IMPRESSION:     Normal endoscopy.  No explanation for patient's clinical or xray findings seen on upper endoscopy.   RECOMMENDATIONS:     1.  Watch for potential complications of procedure. 2.  Will discuss with surgical team.  eSigned:  Willis Modena, MD 05/17/2013 3:06 PM   CC:

## 2013-05-17 NOTE — H&P (View-Only) (Signed)
Subjective: No abdominal pain. Some post-prandial fullness and diarrhea, ongoing.  Objective: Vital signs in last 24 hours: Temp:  [98.1 F (36.7 C)-98.9 F (37.2 C)] 98.9 F (37.2 C) (10/22 0514) Pulse Rate:  [62-89] 62 (10/22 1313) Resp:  [16-18] 16 (10/22 1313) BP: (105-116)/(56-64) 109/63 mmHg (10/22 1313) SpO2:  [98 %-100 %] 99 % (10/22 1313) Weight change:  Last BM Date: 05/15/13  PE: GEN:  NAD ABD:  Soft, non-tender  Lab Results: CBC    Component Value Date/Time   WBC 4.6 05/16/2013 0455   WBC 5.6 03/23/2013 1035   RBC 4.02 05/16/2013 0455   RBC 4.51 03/23/2013 1035   HGB 10.8* 05/16/2013 0455   HCT 32.3* 05/16/2013 0455   PLT 168 05/16/2013 0455   MCV 80.3 05/16/2013 0455   MCH 26.9 05/16/2013 0455   MCH 26.4* 03/23/2013 1035   MCHC 33.4 05/16/2013 0455   MCHC 32.3 03/23/2013 1035   RDW 15.4 05/16/2013 0455   RDW 15.9* 03/23/2013 1035   LYMPHSABS 2.4 05/15/2013 1352   LYMPHSABS 2.3 03/23/2013 1035   MONOABS 0.3 05/15/2013 1352   EOSABS 0.1 05/15/2013 1352   EOSABS 0.2 03/23/2013 1035   BASOSABS 0.0 05/15/2013 1352   BASOSABS 0.0 03/23/2013 1035   CMP     Component Value Date/Time   NA 140 05/16/2013 0455   NA 141 03/23/2013 1035   K 3.3* 05/16/2013 0455   CL 107 05/16/2013 0455   CO2 21 05/16/2013 0455   GLUCOSE 82 05/16/2013 0455   GLUCOSE 78 03/23/2013 1035   BUN 11 05/16/2013 0455   BUN 19 03/23/2013 1035   CREATININE 1.01 05/16/2013 0455   CALCIUM 8.4 05/16/2013 0455   PROT 7.0 05/15/2013 1352   PROT 7.6 04/23/2013 1209   ALBUMIN 3.6 05/15/2013 1352   AST 16 05/15/2013 1352   ALT 9 05/15/2013 1352   ALKPHOS 57 05/15/2013 1352   BILITOT 0.3 05/15/2013 1352   GFRNONAA 53* 05/16/2013 0455   GFRAA 62* 05/16/2013 0455   Assessment:  1.  Free intraperitoneal air.  Unclear etiology.  Endoscopy over one year ago.  Colonoscopy about six months ago.  Very minimal symptoms.  Overall puzzling scenario.  Plan:  1.  Endoscopy tomorrow.  If endoscopy is  unrevealing, doubt utility of any further GI tract endoscopic evaluation. 2.  Next step in management, potentially to include exploratory laparotomy, is pending endoscopy findings. 3.  [facility] inpatient team to follow.   Alicia Smith 05/16/2013, 4:03 PM

## 2013-05-17 NOTE — Progress Notes (Signed)
Patient interviewed and examined, agree with PA note above. No symptoms and abdomen is benign with NO evidence of peritonitis on exam For EGD today. Further plans will be based on findings. Mariella Saa MD, FACS  05/17/2013 10:07 AM

## 2013-05-17 NOTE — Interval H&P Note (Signed)
History and Physical Interval Note:  05/17/2013 2:31 PM  Alicia Smith  has presented today for surgery, with the diagnosis of abdominal fullness, dyspepsia, abnormal xray abdomen  The various methods of treatment have been discussed with the patient and family. After consideration of risks, benefits and other options for treatment, the patient has consented to  Procedure(s): ESOPHAGOGASTRODUODENOSCOPY (EGD) (Left) as a surgical intervention .  The patient's history has been reviewed, patient examined, no change in status, stable for surgery.  I have reviewed the patient's chart and labs.  Questions were answered to the patient's satisfaction.     Rhylei Mcquaig M  Assessment:  1.  Abdominal fullness and dyspepsia. 2.  Free intraperitoneal air.  No evidence of peritonitis.  Plan:  1.  Endoscopy, at surgeon's request, for further evaluation. 2.  Risks (bleeding, infection, bowel perforation that could require surgery, sedation-related changes in cardiopulmonary systems), benefits (identification and possible treatment of source of symptoms, exclusion of certain causes of symptoms), and alternatives (watchful waiting, radiographic imaging studies, empiric medical treatment) of upper endoscopy (EGD) were explained to patient in detail and patient wishes to proceed.

## 2013-05-17 NOTE — Progress Notes (Signed)
Subjective: Denies abdominal pain, n/v.  Sitting up in chair comfortably.  NGT is out.  She is NPO.  Objective: Vital signs in last 24 hours: Temp:  [98.7 F (37.1 C)-99 F (37.2 C)] 99 F (37.2 C) (10/23 0558) Pulse Rate:  [58-80] 58 (10/23 0558) Resp:  [16-20] 20 (10/23 0558) BP: (109-120)/(63-71) 117/71 mmHg (10/23 0558) SpO2:  [99 %-100 %] 99 % (10/23 0558) Last BM Date: 05/15/13  Intake/Output from previous day: 10/22 0701 - 10/23 0700 In: 957.5 [I.V.:957.5] Out: 0  Intake/Output this shift:    Physical Exam:  General: Pt awake/alert/oriented x3 in no acute distress  Chest: clear to auscultation bilaterally. No chest wall pain w good excursion  CV: s1s2 RRR no murmurs gallops or rubs. +2 distal pulses, no edema.  MS: Normal AROM mjr joints. No obvious deformity  Abdomen:+BS. Soft. Nondistended. No evidence of peritonitis. No incarcerated hernias.  Ext: SCDs BLE. No mjr edema. No cyanosis  Skin: No petechiae / purpura   Lab Results:   Recent Labs  05/16/13 0455 05/17/13 0500  WBC 4.6 4.1  HGB 10.8* 11.1*  HCT 32.3* 33.6*  PLT 168 159   BMET  Recent Labs  05/16/13 0455 05/17/13 0500  NA 140 142  K 3.3* 3.4*  CL 107 111  CO2 21 20  GLUCOSE 82 92  BUN 11 9  CREATININE 1.01 1.07  CALCIUM 8.4 8.6    Studies/Results: Ct Abdomen Pelvis W Contrast  05/15/2013   CLINICAL DATA:  Weight loss, bloating, history of H pylori  EXAM: CT ABDOMEN AND PELVIS WITH CONTRAST  TECHNIQUE: Multidetector CT imaging of the abdomen and pelvis was performed using the standard protocol following bolus administration of intravenous contrast.  CONTRAST:  OMNIPAQUE IOHEXOL 300 MG/ML  SOLN  COMPARISON:  None.  BUN and creatinine were obtained on site at [facility] at [address]Results: BUN 15 mg/dL, Creatinine 1.0 mg/dL.  FINDINGS: The lung bases are clear other than a small bleb in the posterior right lower lobe. However, there is a large amount of free  intraperitoneal air present. This amount of air does suggest the possibility of a perforated viscus. However, the patient is not in acute pain according to the CT technologist. The liver enhances with no focal abnormality and no ductal dilatation is seen. Surgical clips are present from prior cholecystectomy. There is a small amount of ascites primarily perihepatic in location. The pancreas is normal in size and the pancreatic duct is not dilated. The adrenal glands and spleen are unremarkable, with a small amount of perisplenic fluid as well. The stomach is not well distended, but distally there is suggestion of a soft tissue mass within the antrum of the stomach. However, on kidney delayed images, this "mass" does not persist and therefore a gastric ulceration and perforation would be the primary consideration in view of the amount of free air present. Endoscopy therefore is recommended.  The kidneys enhance and there is a cyst emanating from the lateral left kidney of approximately 6.4 cm in maximum diameter. On delayed images, the pelvocaliceal systems are unremarkable and the proximal ureters are normal in caliber. The abdominal aorta is normal in caliber. The mesenteric vasculature appears patent. No definite adenopathy is seen.  Free air continues into the pelvis and does enter the umbilicus. There is some free fluid layering within the pelvis as well. A large amount of air is noted in the rectum, and there are multiple diverticula present. A perforated diverticulum could  create free air, but this amount of free air which suggest a gastric etiology as the more likely possibility. Contrast fills much of the small bowel which is unremarkable. Some of the distal small bowel loops are slightly prominent but no definite point of obstruction is noted. The urinary bladder is decompressed. The lumbar vertebrae are normal alignment.  IMPRESSION: 1. Large amount of free intraperitoneal air suggesting a perforated  viscus. The patient is not at this time complaining of significant abdominal pain. 2. Initially there was a question of a possible soft tissue mass in the antrum of the stomach, but this does not appear to persist on delayed images. However in view of the amount of free air, a perforated gastric ulcer or tumor would be the primary consideration. Consider endoscopy. 3. Multiple colonic diverticula primarily in the rectosigmoid and descending colon. No definite diverticulitis is seen. 4. Small to moderate amount of ascites within the peritoneal cavity.   Electronically Signed   By: Dwyane Dee M.D.   On: 05/15/2013 11:56   Dg Abd 2 Views  05/17/2013   CLINICAL DATA:  Free air, followup  EXAM: ABDOMEN - 2 VIEW  COMPARISON:  Abdomen films of 05/16/2013 and CT abdomen pelvis of 05/15/2013  FINDINGS: There is still a large amount of free intraperitoneal air present. The NG tube is no longer seen. Contrast is scattered throughout the decompressed colon with multiple descending colon and rectosigmoid colon diverticula. No definite bowel obstruction is seen. Surgical clips are noted in the right upper quadrant from prior cholecystectomy.  IMPRESSION: 1. No significant change in large amount of free intraperitoneal air. 2. Decompressed colon with multiple colonic diverticula. No bowel obstruction.   Electronically Signed   By: Dwyane Dee M.D.   On: 05/17/2013 08:08   Dg Abd Acute W/chest  05/16/2013   CLINICAL DATA:  Free air on prior CT  EXAM: ACUTE ABDOMEN SERIES (ABDOMEN 2 VIEW & CHEST 1 VIEW)  COMPARISON:  05/15/2013  FINDINGS: The cardiac shadow is stable. The lungs are clear bilaterally. A nasogastric catheter is noted just beyond the gastroesophageal junction. A significant amount of free air is seen within the abdomen. Postsurgical changes are noted. Contrast material is seen within the colon with significant diverticulosis.  IMPRESSION: Significant persistent free air suggestive of perforated viscus. This is  stable in appearance from the prior exam.   Electronically Signed   By: Alcide Clever M.D.   On: 05/16/2013 07:56    Assessment/Plan: Peritoneal free air, etiology unclear.  Remains asymptomatic with a normal white count and evidence of peritonitis.  Plan for Endoscopy today with Dr. Dulce Sellar.  Based on findings, she may need a diagnostic laparoscopy.  The patient states that she wishes to go home following the endoscopy regardless of findings.   VTE prophylaxis  -SCDs, lovenox, mobilize   GERD  -Protonix   LOS: 2 days    Akia Desroches ANP-BC  05/17/2013 8:37 AM

## 2013-05-17 NOTE — Progress Notes (Signed)
Patient's NG came out during night.  Pt has no c/o nausea,vomiting,or pain.  Dr. Corliss Skains notified.  No orders at this time.

## 2013-05-17 NOTE — Progress Notes (Signed)
Pt has had 16 oz of Coke. Removed cups from room and explained NPO status. Clarified NPO status with Dr. Dulce Sellar.

## 2013-05-18 ENCOUNTER — Encounter (HOSPITAL_COMMUNITY): Payer: Self-pay | Admitting: Gastroenterology

## 2013-05-18 ENCOUNTER — Inpatient Hospital Stay (HOSPITAL_COMMUNITY): Payer: Medicare Other

## 2013-05-18 NOTE — Progress Notes (Signed)
Patient interviewed and examined, agree with PA note above. Her abdomen remains totally benign on exam and she has no pain.  I discussed with the patient that I would recommend laparoscopy if she had persistent free air on her x-rays today. She initially was in agreement. Her x-rays do show persistent free air. However at this point she does not want any surgical intervention. I really cannot argue against this she has no pain or tenderness or evidence of infection. We will try a diet tonight and if she tolerates this well will discharge with early followup in the office and further GI workup as an outpatient.  Mariella Saa MD, FACS  05/18/2013 6:22 PM

## 2013-05-18 NOTE — Progress Notes (Signed)
1 Day Post-Op  Subjective: She says she's fine and never had any pain.  Objective: Vital signs in last 24 hours: Temp:  [97.8 F (36.6 C)-98.7 F (37.1 C)] 98.5 F (36.9 C) (10/24 1045) Pulse Rate:  [59-88] 69 (10/24 1045) Resp:  [15-29] 20 (10/24 1045) BP: (112-145)/(56-98) 145/91 mmHg (10/24 1045) SpO2:  [95 %-100 %] 98 % (10/24 1045) Last BM Date: 05/15/13 Afebrile, VSS No labs today, Film pending Intake/Output from previous day: June 01, 2023 0701 - 10/24 0700 In: 1183 [I.V.:1183] Out: -  Intake/Output this shift:    General appearance: alert, cooperative and no distress GI: soft, non-tender; bowel sounds normal; no masses,  no organomegaly  Lab Results:   Recent Labs  05/16/13 0455 2013/05/31 0500  WBC 4.6 4.1  HGB 10.8* 11.1*  HCT 32.3* 33.6*  PLT 168 159    BMET  Recent Labs  05/16/13 0455 05/31/2013 0500  NA 140 142  K 3.3* 3.4*  CL 107 111  CO2 21 20  GLUCOSE 82 92  BUN 11 9  CREATININE 1.01 1.07  CALCIUM 8.4 8.6   PT/INR No results found for this basename: LABPROT, INR,  in the last 72 hours   Recent Labs Lab 05/15/13 1352  AST 16  ALT 9  ALKPHOS 57  BILITOT 0.3  PROT 7.0  ALBUMIN 3.6     Lipase  No results found for this basename: lipase     Studies/Results: Dg Abd 2 Views  May 31, 2013   CLINICAL DATA:  Free air, followup  EXAM: ABDOMEN - 2 VIEW  COMPARISON:  Abdomen films of 05/16/2013 and CT abdomen pelvis of 05/15/2013  FINDINGS: There is still a large amount of free intraperitoneal air present. The NG tube is no longer seen. Contrast is scattered throughout the decompressed colon with multiple descending colon and rectosigmoid colon diverticula. No definite bowel obstruction is seen. Surgical clips are noted in the right upper quadrant from prior cholecystectomy.  IMPRESSION: 1. No significant change in large amount of free intraperitoneal air. 2. Decompressed colon with multiple colonic diverticula. No bowel obstruction.   Electronically  Signed   By: Dwyane Dee M.D.   On: May 31, 2013 08:08    Medications: . antiseptic oral rinse  15 mL Mouth Rinse q12n4p  . chlorhexidine  15 mL Mouth Rinse BID  . enoxaparin (LOVENOX) injection  40 mg Subcutaneous Q24H  . pantoprazole (PROTONIX) IV  40 mg Intravenous QHS    Assessment/Plan Peritoneal free air, etiology unclear.  S/p EGD 2013-05-31 ws normal with no explanation for CT finding of free air. Reflux symptoms Hx of cholecystectomy, hysterectomy and C section   Plan:  She has been seen by Dr. Johna Sheriff, he wants a film and if air is better let her go home and follow up with him next week.  If worse or no better he may do laparoscopy and see if he sees anything.  She remains asymptomatic.        LOS: 3 days    Alicia Smith 05/18/2013

## 2013-05-19 NOTE — Progress Notes (Signed)
General Surgery Note  LOS: 4 days  POD -  2 Days Post-Op  Assessment/Plan: 1.  Pneumoperitoneum, cause unknown.  Asymptomatic.  No explanation for x-ray findings.  Has tolerated regular food.  To discharge home.  2.   ESOPHAGOGASTRODUODENOSCOPY (EGD) - 05/17/2013 - W. Outlaw   3.  DVT prophylaxis - Lovenox 4.  GERD -  5.  Loose stools - chronic  Follow up with Dr. Bosie Clos.   Active Problems:   Peritoneal free air  Subjective:  Doing well. No pain. No fever.  Has never felt bad.  Ready to go home. Objective:   Filed Vitals:   05/19/13 0535  BP: 97/42  Pulse: 72  Temp: 98 F (36.7 C)  Resp: 20     Intake/Output from previous day:  10/24 0701 - 10/25 0700 In: 1972 [P.O.:600; I.V.:1372] Out: -   Intake/Output this shift:      Physical Exam:   General: WN AA F who is alert and oriented.    HEENT: Normal. Pupils equal. .   Lungs: Clear   Abdomen: Soft.  BS present.   Lab Results:    Recent Labs  05/17/13 0500  WBC 4.1  HGB 11.1*  HCT 33.6*  PLT 159    BMET   Recent Labs  05/17/13 0500  NA 142  K 3.4*  CL 111  CO2 20  GLUCOSE 92  BUN 9  CREATININE 1.07  CALCIUM 8.6    PT/INR  No results found for this basename: LABPROT, INR,  in the last 72 hours  ABG  No results found for this basename: PHART, PCO2, PO2, HCO3,  in the last 72 hours   Studies/Results:  Dg Abd 2 Views  05/18/2013   CLINICAL DATA:  Followup free air  EXAM: ABDOMEN - 2 VIEW  COMPARISON:  Radiographs 05/17/2013, CT 05/15/2013  FINDINGS: Again demonstrated a large volume of intraperitoneal free air beneath the left and right hemidiaphragm which does not appear to decreased from comparison exam. No dilated loops of large or small bowel. Multiple diverticular noted throughout the descending colon and sigmoid colon. There is gas in the rectum. Post cholecystectomy. Appendix is normal.  IMPRESSION: 1. Large volume of intraperitoneal free air is essentially unchanged.  2. No evidence of  bowel obstruction.   Electronically Signed   By: Genevive Bi M.D.   On: 05/18/2013 15:27     Anti-infectives:   Anti-infectives   None      Ovidio Kin, MD, FACS Pager: [phone] Surgery Office: [phone] 05/19/2013

## 2013-05-19 NOTE — Discharge Summary (Signed)
Physician Discharge Summary  Patient ID:  Alicia Smith  MRN: [id number]  DOB/AGE: Sep 04, 1935 77 y.o.  Admit date: 05/15/2013 Discharge date: 05/19/2013  Discharge Diagnoses:  1. Pneumoperitoneum, cause unknown.   Asymptomatic. No explanation for x-ray findings.    2. Loose stools - chronic   Follow up with Dr. Bosie Clos. 3. DVT prophylaxis - Lovenox  4. GERD -   Operation: Procedure(s): ESOPHAGOGASTRODUODENOSCOPY (EGD) on 05/15/2013 - 05/17/2013  Discharged Condition: good  Hospital Course: Alicia Smith is an 77 y.o. female whose primary care physician is REED, [place], DO and who was admitted 05/15/2013 with a chief complaint of  Chief Complaint  Patient presents with  . Abdominal Pain  .  She was found to have an unexplained pneumoperitoneum on CT scan and KUB's.  At first there was a discussion about laparoscopy, but the patient has not had peritoneal sxes.  She has been entirely asymptomatic and I can find no reason that she needs surgery.   She has tolerated a regular diet, she is having bowel movements, and she has no abdominal symptoms. She is ready for discharge. The discharge instructions were reviewed with the patient.  Consults: GI  Significant Diagnostic Studies: Results for orders placed during the hospital encounter of 05/15/13  CBC WITH DIFFERENTIAL      Result Value Range   WBC 5.7  4.0 - 10.5 K/uL   RBC 4.18  3.87 - 5.11 MIL/uL   Hemoglobin 11.2 (*) 12.0 - 15.0 g/dL   HCT 40.9 (*) 81.1 - 91.4 %   MCV 82.1  78.0 - 100.0 fL   MCH 26.8  26.0 - 34.0 pg   MCHC 32.7  30.0 - 36.0 g/dL   RDW 78.2  95.6 - 21.3 %   Platelets 158  150 - 400 K/uL   Neutrophils Relative % 50  43 - 77 %   Neutro Abs 2.9  1.7 - 7.7 K/uL   Lymphocytes Relative 42  12 - 46 %   Lymphs Abs 2.4  0.7 - 4.0 K/uL   Monocytes Relative 6  3 - 12 %   Monocytes Absolute 0.3  0.1 - 1.0 K/uL   Eosinophils Relative 2  0 - 5 %   Eosinophils Absolute 0.1  0.0 - 0.7 K/uL   Basophils  Relative 1  0 - 1 %   Basophils Absolute 0.0  0.0 - 0.1 K/uL  COMPREHENSIVE METABOLIC PANEL      Result Value Range   Sodium 139  135 - 145 mEq/L   Potassium 3.4 (*) 3.5 - 5.1 mEq/L   Chloride 106  96 - 112 mEq/L   CO2 21  19 - 32 mEq/L   Glucose, Bld 85  70 - 99 mg/dL   BUN 16  6 - 23 mg/dL   Creatinine, Ser 0.86  0.50 - 1.10 mg/dL   Calcium 8.9  8.4 - 57.8 mg/dL   Total Protein 7.0  6.0 - 8.3 g/dL   Albumin 3.6  3.5 - 5.2 g/dL   AST 16  0 - 37 U/L   ALT 9  0 - 35 U/L   Alkaline Phosphatase 57  39 - 117 U/L   Total Bilirubin 0.3  0.3 - 1.2 mg/dL   GFR calc non Af Amer 52 (*) >90 mL/min   GFR calc Af Amer 61 (*) >90 mL/min  BASIC METABOLIC PANEL      Result Value Range   Sodium 140  135 - 145 mEq/L   Potassium 3.3 (*)  3.5 - 5.1 mEq/L   Chloride 107  96 - 112 mEq/L   CO2 21  19 - 32 mEq/L   Glucose, Bld 82  70 - 99 mg/dL   BUN 11  6 - 23 mg/dL   Creatinine, Ser 0.86  0.50 - 1.10 mg/dL   Calcium 8.4  8.4 - 57.8 mg/dL   GFR calc non Af Amer 53 (*) >90 mL/min   GFR calc Af Amer 62 (*) >90 mL/min  CBC      Result Value Range   WBC 4.6  4.0 - 10.5 K/uL   RBC 4.02  3.87 - 5.11 MIL/uL   Hemoglobin 10.8 (*) 12.0 - 15.0 g/dL   HCT 46.9 (*) 62.9 - 52.8 %   MCV 80.3  78.0 - 100.0 fL   MCH 26.9  26.0 - 34.0 pg   MCHC 33.4  30.0 - 36.0 g/dL   RDW 41.3  24.4 - 01.0 %   Platelets 168  150 - 400 K/uL  CBC      Result Value Range   WBC 4.1  4.0 - 10.5 K/uL   RBC 4.10  3.87 - 5.11 MIL/uL   Hemoglobin 11.1 (*) 12.0 - 15.0 g/dL   HCT 27.2 (*) 53.6 - 64.4 %   MCV 82.0  78.0 - 100.0 fL   MCH 27.1  26.0 - 34.0 pg   MCHC 33.0  30.0 - 36.0 g/dL   RDW 03.4  74.2 - 59.5 %   Platelets 159  150 - 400 K/uL  BASIC METABOLIC PANEL      Result Value Range   Sodium 142  135 - 145 mEq/L   Potassium 3.4 (*) 3.5 - 5.1 mEq/L   Chloride 111  96 - 112 mEq/L   CO2 20  19 - 32 mEq/L   Glucose, Bld 92  70 - 99 mg/dL   BUN 9  6 - 23 mg/dL   Creatinine, Ser 6.38  0.50 - 1.10 mg/dL   Calcium 8.6  8.4  - 75.6 mg/dL   GFR calc non Af Amer 49 (*) >90 mL/min   GFR calc Af Amer 57 (*) >90 mL/min    Ct Abdomen Pelvis W Contrast  05/15/2013   CLINICAL DATA:  Weight loss, bloating, history of H pylori  EXAM: CT ABDOMEN AND PELVIS WITH CONTRAST  TECHNIQUE: Multidetector CT imaging of the abdomen and pelvis was performed using the standard protocol following bolus administration of intravenous contrast.  CONTRAST:  OMNIPAQUE IOHEXOL 300 MG/ML  SOLN  COMPARISON:  None.  BUN and creatinine were obtained on site at [facility] at [address]Results: BUN 15 mg/dL, Creatinine 1.0 mg/dL.  FINDINGS: The lung bases are clear other than a small bleb in the posterior right lower lobe. However, there is a large amount of free intraperitoneal air present. This amount of air does suggest the possibility of a perforated viscus. However, the patient is not in acute pain according to the CT technologist. The liver enhances with no focal abnormality and no ductal dilatation is seen. Surgical clips are present from prior cholecystectomy. There is a small amount of ascites primarily perihepatic in location. The pancreas is normal in size and the pancreatic duct is not dilated. The adrenal glands and spleen are unremarkable, with a small amount of perisplenic fluid as well. The stomach is not well distended, but distally there is suggestion of a soft tissue mass within the antrum of the stomach. However, on kidney delayed images,  this "mass" does not persist and therefore a gastric ulceration and perforation would be the primary consideration in view of the amount of free air present. Endoscopy therefore is recommended.  The kidneys enhance and there is a cyst emanating from the lateral left kidney of approximately 6.4 cm in maximum diameter. On delayed images, the pelvocaliceal systems are unremarkable and the proximal ureters are normal in caliber. The abdominal aorta is normal in caliber. The mesenteric  vasculature appears patent. No definite adenopathy is seen.  Free air continues into the pelvis and does enter the umbilicus. There is some free fluid layering within the pelvis as well. A large amount of air is noted in the rectum, and there are multiple diverticula present. A perforated diverticulum could create free air, but this amount of free air which suggest a gastric etiology as the more likely possibility. Contrast fills much of the small bowel which is unremarkable. Some of the distal small bowel loops are slightly prominent but no definite point of obstruction is noted. The urinary bladder is decompressed. The lumbar vertebrae are normal alignment.  IMPRESSION: 1. Large amount of free intraperitoneal air suggesting a perforated viscus. The patient is not at this time complaining of significant abdominal pain. 2. Initially there was a question of a possible soft tissue mass in the antrum of the stomach, but this does not appear to persist on delayed images. However in view of the amount of free air, a perforated gastric ulcer or tumor would be the primary consideration. Consider endoscopy. 3. Multiple colonic diverticula primarily in the rectosigmoid and descending colon. No definite diverticulitis is seen. 4. Small to moderate amount of ascites within the peritoneal cavity.   Electronically Signed   By: Dwyane Dee M.D.   On: 05/15/2013 11:56   Dg Abd 2 Views  05/18/2013   CLINICAL DATA:  Followup free air  EXAM: ABDOMEN - 2 VIEW  COMPARISON:  Radiographs 05/17/2013, CT 05/15/2013  FINDINGS: Again demonstrated a large volume of intraperitoneal free air beneath the left and right hemidiaphragm which does not appear to decreased from comparison exam. No dilated loops of large or small bowel. Multiple diverticular noted throughout the descending colon and sigmoid colon. There is gas in the rectum. Post cholecystectomy. Appendix is normal.  IMPRESSION: 1. Large volume of intraperitoneal free air is  essentially unchanged.  2. No evidence of bowel obstruction.   Electronically Signed   By: Genevive Bi M.D.   On: 05/18/2013 15:27   Dg Abd 2 Views  05/17/2013   CLINICAL DATA:  Free air, followup  EXAM: ABDOMEN - 2 VIEW  COMPARISON:  Abdomen films of 05/16/2013 and CT abdomen pelvis of 05/15/2013  FINDINGS: There is still a large amount of free intraperitoneal air present. The NG tube is no longer seen. Contrast is scattered throughout the decompressed colon with multiple descending colon and rectosigmoid colon diverticula. No definite bowel obstruction is seen. Surgical clips are noted in the right upper quadrant from prior cholecystectomy.  IMPRESSION: 1. No significant change in large amount of free intraperitoneal air. 2. Decompressed colon with multiple colonic diverticula. No bowel obstruction.   Electronically Signed   By: Dwyane Dee M.D.   On: 05/17/2013 08:08   Dg Abd Acute W/chest  05/16/2013   CLINICAL DATA:  Free air on prior CT  EXAM: ACUTE ABDOMEN SERIES (ABDOMEN 2 VIEW & CHEST 1 VIEW)  COMPARISON:  05/15/2013  FINDINGS: The cardiac shadow is stable. The lungs are clear bilaterally. A nasogastric  catheter is noted just beyond the gastroesophageal junction. A significant amount of free air is seen within the abdomen. Postsurgical changes are noted. Contrast material is seen within the colon with significant diverticulosis.  IMPRESSION: Significant persistent free air suggestive of perforated viscus. This is stable in appearance from the prior exam.   Electronically Signed   By: Alcide Clever M.D.   On: 05/16/2013 07:56    Discharge Exam:  Filed Vitals:   05/19/13 0535  BP: 97/42  Pulse: 72  Temp: 98 F (36.7 C)  Resp: 20    General: WN pleasant older AAF who is alert and generally healthy appearing.  Lungs: Clear to auscultation and symmetric breath sounds. Heart:  RRR. No murmur or rub. Abdomen: Soft. No mass. No tenderness. No hernia. Normal bowel sounds. She has  entirely benign abdominal exam.  Discharge Medications:     Medication List    Notice   You have not been prescribed any medications.      Disposition: Final discharge disposition not confirmed      Discharge Orders   Future Appointments Provider Department Dept Phone   07/23/2013 10:30 AM Kermit Balo, DO [facility] [phone]   Future Orders Complete By Expires   Diet - low sodium heart healthy  As directed    Increase activity slowly  As directed      [facility] - DISCHARGE INSTRUCTIONS TO PATIENT  Diet:  As tolerated  Follow up appointment:  Call Dr. Marge Duncans office [facility]) at [phone] for an appointment.  Medications and dosages:  Resume your home medications.   In an emergency, call 911 or go to an Emergency Department at a nearby hospital.  Signed: Ovidio Kin, M.D., [facility] Office:  [phone]  05/19/2013, 11:40 AM

## 2013-06-04 ENCOUNTER — Ambulatory Visit: Payer: Self-pay | Admitting: Internal Medicine

## 2013-06-20 ENCOUNTER — Ambulatory Visit (INDEPENDENT_AMBULATORY_CARE_PROVIDER_SITE_OTHER): Payer: Medicare Other | Admitting: Nurse Practitioner

## 2013-06-20 ENCOUNTER — Encounter: Payer: Self-pay | Admitting: Nurse Practitioner

## 2013-06-20 VITALS — BP 132/74 | HR 90 | Temp 98.3°F | Wt 162.0 lb

## 2013-06-20 DIAGNOSIS — L738 Other specified follicular disorders: Secondary | ICD-10-CM

## 2013-06-20 DIAGNOSIS — R634 Abnormal weight loss: Secondary | ICD-10-CM

## 2013-06-20 DIAGNOSIS — L853 Xerosis cutis: Secondary | ICD-10-CM

## 2013-06-20 DIAGNOSIS — K8689 Other specified diseases of pancreas: Secondary | ICD-10-CM

## 2013-06-20 NOTE — Progress Notes (Signed)
Patient ID: Alicia Smith, female   DOB: [birth year], 77 y.o.   MRN: [id number]    No Known Allergies  Chief Complaint  Patient presents with  . Acute Visit    both hands peeling x weeks    HPI: Patient is a 77 y.o. female seen in the office today for her skin being dry all over Having weight loss; feels like something in her diet is missing. Since she has had h. Pylori she has not had an appetite; was diagnosised with h. Pylori last year and since then she has lost over 100 lbs (1 year in a half) still following with GI; has had a significant diet change; no fired foods, fast food, beef, cookies and sweets. No taste for bad foods; trying to eat better, Current BMI of 26; Has not seen a nutritionist   Pt also has been placed on creon by GI due to diarrhea; this helped her stools and she also gained weight however when she ran out of medications she did not refill her Rx until Saturday and had been out of it for several weeks Review of Systems:  Review of Systems  Constitutional: Positive for weight loss.  Respiratory: Negative for cough and shortness of breath.   Cardiovascular: Negative for chest pain.  Gastrointestinal: Positive for diarrhea. Negative for heartburn, vomiting, abdominal pain and blood in stool.  Genitourinary: Negative for dysuria.  Musculoskeletal: Negative for myalgias.  Skin:       Dry skin  Psychiatric/Behavioral: Negative for depression.     Past Medical History  Diagnosis Date  . High cholesterol   . Sleep apnea     HX OF APNEA LOST WEIGHT AND NO LONGER   . H/O hiatal hernia   . GERD (gastroesophageal reflux disease)    Past Surgical History  Procedure Laterality Date  . Abdominal hysterectomy  1984    Dr. Gaynell Face  . Cholecystectomy  [address]    [place], [place]  . Cesarean section  1959    [place]  . Esophagogastroduodenoscopy Left 05/17/2013    Procedure: ESOPHAGOGASTRODUODENOSCOPY (EGD);  Surgeon: Willis Modena, MD;  Location: [facility] ENDOSCOPY;  Service:  Endoscopy;  Laterality: Left;   Social History:   reports that she quit smoking about 30 years ago. Her smoking use included Cigarettes. She smoked 0.00 packs per day. She has never used smokeless tobacco. She reports that she drinks alcohol. She reports that she uses illicit drugs (Marijuana).  Family History  Problem Relation Age of Onset  . Cancer Mother   . Cancer Father   . Cancer Sister     breast  . Diabetes Sister   . Hypertension Sister   . Cancer Brother     prostate  . Kidney disease Sister   . Heart disease Brother     Medications: Patient's Medications  New Prescriptions   No medications on file  Previous Medications   LIPASE/PROTEASE/AMYLASE (CREON-12/PANCREASE) 12000 UNITS CPEP CAPSULE    Take 2 capsules by mouth. Take 2 tablets with each meals and 1 with snack  Modified Medications   No medications on file  Discontinued Medications   No medications on file     Physical Exam:  Filed Vitals:   06/20/13 0902  BP: 132/74  Pulse: 90  Temp: 98.3 F (36.8 C)  TempSrc: Oral  Weight: 162 lb (73.483 kg)  SpO2: 99%    Physical Exam  Constitutional: She is well-developed, well-nourished, and in no distress. No distress.  Cardiovascular: Normal rate, regular rhythm  and normal heart sounds.   Pulmonary/Chest: Effort normal and breath sounds normal.  Abdominal: Soft. Bowel sounds are normal. She exhibits no distension. There is no tenderness.  Musculoskeletal: She exhibits no edema and no tenderness.  Neurological: She is alert.  Skin: Skin is warm and dry. She is not diaphoretic.  Dry skin on hands  Psychiatric: Affect normal.     Labs reviewed: Basic Metabolic Panel:  Recent Labs  16/10/96 1035 05/15/13 1352 05/16/13 0455 05/17/13 0500  NA 141 139 140 142  K 4.0 3.4* 3.3* 3.4*  CL 106 106 107 111  CO2 19 21 21 20   GLUCOSE 78 85 82 92  BUN 19 16 11 9   CREATININE 1.34* 1.02 1.01 1.07  CALCIUM 9.1 8.9 8.4 8.6  TSH 1.880  --   --   --     Liver Function Tests:  Recent Labs  03/23/13 1035 04/23/13 1209 05/15/13 1352  AST 10  --  16  ALT 5  --  9  ALKPHOS 55  --  57  BILITOT 0.2  --  0.3  PROT 6.7 7.6 7.0  ALBUMIN  --   --  3.6   No results found for this basename: LIPASE, AMYLASE,  in the last 8760 hours No results found for this basename: AMMONIA,  in the last 8760 hours CBC:  Recent Labs  03/23/13 1035 05/15/13 1352 05/16/13 0455 05/17/13 0500  WBC 5.6 5.7 4.6 4.1  NEUTROABS 2.8 2.9  --   --   HGB 11.9 11.2* 10.8* 11.1*  HCT 36.8 34.3* 32.3* 33.6*  MCV 82 82.1 80.3 82.0  PLT  --  158 168 159   Lipid Panel:  Recent Labs  03/23/13 1035  HDL 42  LDLCALC 94  TRIG 92  CHOLHDL 3.7   TSH:  Recent Labs  03/23/13 1035  TSH 1.880   A1C: No components found with this basename: A1C,    Assessment/Plan 1. Loss of weight -encouraged to eat 3 meals a day with snacks -to take ensure in between meals with snack -to take creon with all meals and snacks which should help with weight loss -will cont to follow up  2. Pancreatic insufficiency -placed on creon due to diarrhea which helped and pt also reported weight gain however has not been taking this has prescribed; not new refill 5 days ago, diarrhea has improved some -conts to follow with GI  3. Dry skin -increase water intake -used Eucerin the past which helped (dry skin is worse now that she is not taking) encouraged her to get eucerin lotion and use daily    To keep follow up with Renato Gails and may follow up before if needed

## 2013-06-20 NOTE — Patient Instructions (Signed)
Increase water intake Use Eucerin for dry skin Take ensure in between meals Take creon as prescribed!! Need to take 2 pills with all meals and 1 tablet with any snack   Keep follow up and we will monitor weight

## 2013-07-23 ENCOUNTER — Ambulatory Visit: Payer: Medicare Other | Admitting: Internal Medicine

## 2013-07-30 ENCOUNTER — Ambulatory Visit: Payer: Medicare Other | Admitting: Internal Medicine

## 2013-08-27 ENCOUNTER — Ambulatory Visit: Payer: Medicare Other | Admitting: Internal Medicine

## 2013-10-04 ENCOUNTER — Encounter: Payer: Self-pay | Admitting: Internal Medicine

## 2013-10-04 ENCOUNTER — Ambulatory Visit (INDEPENDENT_AMBULATORY_CARE_PROVIDER_SITE_OTHER): Payer: Medicare Other | Admitting: Internal Medicine

## 2013-10-04 VITALS — BP 120/80 | HR 93 | Temp 97.9°F | Resp 18 | Ht 66.0 in | Wt 157.0 lb

## 2013-10-04 DIAGNOSIS — K2990 Gastroduodenitis, unspecified, without bleeding: Secondary | ICD-10-CM

## 2013-10-04 DIAGNOSIS — K2970 Gastritis, unspecified, without bleeding: Secondary | ICD-10-CM

## 2013-10-04 DIAGNOSIS — R739 Hyperglycemia, unspecified: Secondary | ICD-10-CM

## 2013-10-04 DIAGNOSIS — R634 Abnormal weight loss: Secondary | ICD-10-CM

## 2013-10-04 DIAGNOSIS — E785 Hyperlipidemia, unspecified: Secondary | ICD-10-CM

## 2013-10-04 DIAGNOSIS — D649 Anemia, unspecified: Secondary | ICD-10-CM

## 2013-10-04 DIAGNOSIS — E538 Deficiency of other specified B group vitamins: Secondary | ICD-10-CM

## 2013-10-04 DIAGNOSIS — R7309 Other abnormal glucose: Secondary | ICD-10-CM

## 2013-10-04 DIAGNOSIS — R5381 Other malaise: Secondary | ICD-10-CM

## 2013-10-04 DIAGNOSIS — R5383 Other fatigue: Secondary | ICD-10-CM

## 2013-10-04 NOTE — Progress Notes (Signed)
Patient ID: Alicia Smith, female   DOB: Jun 16, 1936, 78 y.o.   MRN: [id number]   Location:  [facility]  No Known Allergies  Chief Complaint  Patient presents with  . Follow-up    HPI: Patient is a 78 y.o. black female seen in the office today for medical mgt of chronic diseases.  She continues to lose weight.  Now 157, was 162 lbs in Nov 2014.  Completed H pylori treatment.  Feels all right, but does not have any energy.  Wants to take ensure--cannot take due to milk in it.  Alive made her sick.  Says since Dr. Bosie Clos said her bowels were controllable, no need for further creon use--she told him it didn't help (had helped at first back in nov when saw Shanda Bumps here).  She is to f/u with him in 1 year.  Had cscope last year.  Still hasn't gotten probiotics, but Christy Gentles is helping some.  Does not eat veggies the way she should.  Fruit (oranges) made her stomach sore.  Only uses aleve 1-2x per month.  Keeps frozen veggies, but too lazy to cook them.    Review of Systems:  Review of Systems  Constitutional: Positive for weight loss and malaise/fatigue. Negative for fever and chills.       Not exercising  HENT: Negative for congestion.   Eyes: Negative for blurred vision.  Respiratory: Negative for shortness of breath.   Cardiovascular: Negative for chest pain.  Gastrointestinal: Positive for heartburn and diarrhea. Negative for nausea, vomiting, abdominal pain, constipation, blood in stool and melena.  Genitourinary: Negative for dysuria.  Musculoskeletal: Negative for falls and myalgias.  Skin: Negative for rash.  Neurological: Positive for weakness. Negative for dizziness, loss of consciousness and headaches.  Endo/Heme/Allergies: Does not bruise/bleed easily.  Psychiatric/Behavioral: Negative for depression and memory loss.     Past Medical History  Diagnosis Date  . High cholesterol   . Sleep apnea     HX OF APNEA LOST WEIGHT AND NO  LONGER   . H/O hiatal hernia   . GERD (gastroesophageal reflux disease)     Past Surgical History  Procedure Laterality Date  . Abdominal hysterectomy  1984    Dr. Gaynell Face  . Cholecystectomy  [address]    [place], [place]  . Cesarean section  1959    [place]  . Esophagogastroduodenoscopy Left 05/17/2013    Procedure: ESOPHAGOGASTRODUODENOSCOPY (EGD);  Surgeon: Willis Modena, MD;  [facility] END[facility]COPY;  Service: Endoscopy;  Laterality: Left;    Social History:   reports that she quit smoking about 31 years ago. Her smoking use included Cigarettes. She smoked 0.00 packs per day. She has never used smokeless tobacco. She reports that she drinks alcohol. She reports that she uses illicit drugs (Marijuana).  Family History  Problem Relation Age of Onset  . Cancer Mother   . Cancer Father   . Cancer Sister     breast  . Diabetes Sister   . Hypertension Sister   . Cancer Brother     prostate  . Kidney disease Sister   . Heart disease Brother     Medications: Patient's Medications  New Prescriptions   No medications on file  Previous Medications   NAPROXEN SODIUM (ANAPROX) 220 MG TABLET    Take 220 mg by mouth as needed.   RANITIDINE (ZANTAC) 150 MG TABLET    Take 150 mg by mouth as needed for heartburn.  Modified Medications  No medications on file  Discontinued Medications   LIPASE/PROTEASE/AMYLASE (CREON-12/PANCREASE) 12000 UNITS CPEP CAPSULE    Take 2 capsules by mouth. Take 2 tablets with each meals and 1 with snack     Physical Exam: Filed Vitals:   10/04/13 0739  BP: 120/80  Pulse: 93  Temp: 97.9 F (36.6 C)  TempSrc: Oral  Resp: 18  Height: 5\' 6"  (1.676 m)  Weight: 157 lb (71.215 kg)  SpO2: 95%  Physical Exam  Constitutional: She is oriented to person, place, and time. She appears well-developed and well-nourished. No distress.  Cardiovascular: Normal rate, regular rhythm, normal heart sounds and intact distal pulses.   Pulmonary/Chest: Effort normal and  breath sounds normal. No respiratory distress.  Abdominal: Soft. Bowel sounds are normal. She exhibits no distension and no mass. There is no tenderness.  Still has bubbling sound during abdominal palpation of lower abdomen  Musculoskeletal:  Loss of muscle mass of lower extremities notable  Neurological: She is alert and oriented to person, place, and time.  Skin: Skin is warm and dry.  Psychiatric: She has a normal mood and affect.     Labs reviewed: Basic Metabolic Panel:  Recent Labs  16/04/9607/29/14 1035 05/15/13 1352 05/16/13 0455 05/17/13 0500  NA 141 139 140 142  K 4.0 3.4* 3.3* 3.4*  CL 106 106 107 111  CO2 19 21 21 20   GLUCOSE 78 85 82 92  BUN 19 16 11 9   CREATININE 1.34* 1.02 1.01 1.07  CALCIUM 9.1 8.9 8.4 8.6  TSH 1.880  --   --   --    Liver Function Tests:  Recent Labs  03/23/13 1035 04/23/13 1209 05/15/13 1352  AST 10  --  16  ALT 5  --  9  ALKPHOS 55  --  57  BILITOT 0.2  --  0.3  PROT 6.7 7.6 7.0  ALBUMIN  --   --  3.6  CBC:  Recent Labs  03/23/13 1035 05/15/13 1352 05/16/13 0455 05/17/13 0500  WBC 5.6 5.7 4.6 4.1  NEUTROABS 2.8 2.9  --   --   HGB 11.9 11.2* 10.8* 11.1*  HCT 36.8 34.3* 32.3* 33.6*  MCV 82 82.1 80.3 82.0  PLT  --  158 168 159   Lipid Panel:  Recent Labs  03/23/13 1035  HDL 42  LDLCALC 94  TRIG 92  CHOLHDL 3.7   Past Procedures: Reviewed EGD results  Assessment/Plan 1. Loss of weight -persists though it has slowed down and continues with loose stools, but helped with activia yogurt so encouraged continued use -creon only worked short term -knows what to avoid though occasionally cheats (like ate fried fish yesterday) -no rectal bleeding and initial labs did not show other causes and has no localizing pain to suggest myeloma -will check the following labs that could contribute: - B12 and Folate Panel - Hemoglobin A1c - TSH - HIV antibody - Hepatic Function Panel - Basic metabolic panel  2. Gastritis -f/u on  labs--cont watching foods that provoke symptoms - B12 and Folate Panel - Hepatic Function Panel - Basic metabolic panel  3. Hyperlipidemia LDL goal < 100 -advised she could take omega fish oil - Lipid panel  4. Other malaise and fatigue -r/o b12, folate, iron deficiency, diabetes, worsened anemia as causes - B12 and Folate Panel - CBC with Differential - Hemoglobin A1c - IBC Panel - Ferritin  5. Normocytic anemia - was getting progressively worse at the end of last year -need f/u cbc and  to check for causes (may be due to her gastritis and hiatal hernia) -B12 and Folate Panel - CBC with Differential - IBC Panel - Ferritin  6. Hyperglycemia - Hemoglobin A1c  7. B12 deficiency - B12 and Folate Panel  Labs/tests ordered:   Orders Placed This Encounter  Procedures  . B12 and Folate Panel  . CBC with Differential  . Hemoglobin A1c  . Lipid panel    Order Specific Question:  Has the patient fasted?    Answer:  Yes  . TSH  . HIV antibody  . Hepatic Function Panel  . Basic metabolic panel    Order Specific Question:  Has the patient fasted?    Answer:  Yes  . IBC Panel  . Ferritin    Next appt:  1 week re: LABS

## 2013-10-05 ENCOUNTER — Other Ambulatory Visit: Payer: Self-pay | Admitting: Internal Medicine

## 2013-10-05 ENCOUNTER — Encounter: Payer: Self-pay | Admitting: Internal Medicine

## 2013-10-05 DIAGNOSIS — N179 Acute kidney failure, unspecified: Secondary | ICD-10-CM

## 2013-10-05 LAB — CBC WITH DIFFERENTIAL/PLATELET
Basophils Absolute: 0 10*3/uL (ref 0.0–0.2)
Basos: 1 %
Eos: 3 %
Eosinophils Absolute: 0.2 10*3/uL (ref 0.0–0.4)
HCT: 34 % (ref 34.0–46.6)
Hemoglobin: 11 g/dL — ABNORMAL LOW (ref 11.1–15.9)
Immature Grans (Abs): 0 10*3/uL (ref 0.0–0.1)
Immature Granulocytes: 0 %
Lymphocytes Absolute: 2.3 10*3/uL (ref 0.7–3.1)
Lymphs: 34 %
MCH: 27.2 pg (ref 26.6–33.0)
MCHC: 32.4 g/dL (ref 31.5–35.7)
MCV: 84 fL (ref 79–97)
Monocytes Absolute: 0.4 10*3/uL (ref 0.1–0.9)
Monocytes: 6 %
Neutrophils Absolute: 3.7 10*3/uL (ref 1.4–7.0)
Neutrophils Relative %: 56 %
RBC: 4.04 x10E6/uL (ref 3.77–5.28)
RDW: 15.6 % — ABNORMAL HIGH (ref 12.3–15.4)
WBC: 6.6 10*3/uL (ref 3.4–10.8)

## 2013-10-05 LAB — BASIC METABOLIC PANEL
BUN/Creatinine Ratio: 11 (ref 11–26)
BUN: 19 mg/dL (ref 8–27)
CO2: 18 mmol/L (ref 18–29)
Calcium: 9.1 mg/dL (ref 8.7–10.3)
Chloride: 107 mmol/L (ref 97–108)
Creatinine, Ser: 1.7 mg/dL — ABNORMAL HIGH (ref 0.57–1.00)
GFR calc Af Amer: 33 mL/min/{1.73_m2} — ABNORMAL LOW (ref >59)
GFR calc non Af Amer: 29 mL/min/{1.73_m2} — ABNORMAL LOW (ref >59)
Glucose: 88 mg/dL (ref 65–99)
Potassium: 3.9 mmol/L (ref 3.5–5.2)
Sodium: 143 mmol/L (ref 134–144)

## 2013-10-05 LAB — LIPID PANEL
Chol/HDL Ratio: 3.6 ratio units (ref 0.0–4.4)
Cholesterol, Total: 185 mg/dL (ref 100–199)
HDL: 51 mg/dL (ref >39)
LDL Calculated: 114 mg/dL — ABNORMAL HIGH (ref 0–99)
Triglycerides: 98 mg/dL (ref 0–149)
VLDL Cholesterol Cal: 20 mg/dL (ref 5–40)

## 2013-10-05 LAB — HIV ANTIBODY (ROUTINE TESTING W REFLEX)
HIV 1/O/2 Abs-Index Value: <1 (ref <1.00)
HIV-1/HIV-2 Ab: NONREACTIVE

## 2013-10-05 LAB — HEPATIC FUNCTION PANEL
ALT: 14 IU/L (ref 0–32)
AST: 19 IU/L (ref 0–40)
Albumin: 4.1 g/dL (ref 3.5–4.8)
Alkaline Phosphatase: 70 IU/L (ref 39–117)
Bilirubin, Direct: 0.09 mg/dL (ref 0.00–0.40)
Total Bilirubin: 0.2 mg/dL (ref 0.0–1.2)
Total Protein: 6.8 g/dL (ref 6.0–8.5)

## 2013-10-05 LAB — FERRITIN: Ferritin: 261 ng/mL — ABNORMAL HIGH (ref 15–150)

## 2013-10-05 LAB — HEMOGLOBIN A1C
Est. average glucose Bld gHb Est-mCnc: 103 mg/dL
Hgb A1c MFr Bld: 5.2 % (ref 4.8–5.6)

## 2013-10-05 LAB — TSH: TSH: 2.26 u[IU]/mL (ref 0.450–4.500)

## 2013-10-05 LAB — B12 AND FOLATE PANEL
Folate: 12.1 ng/mL (ref >3.0)
Vitamin B-12: 148 pg/mL — ABNORMAL LOW (ref 211–946)

## 2013-10-10 ENCOUNTER — Other Ambulatory Visit: Payer: Self-pay

## 2013-10-11 ENCOUNTER — Other Ambulatory Visit: Payer: Self-pay | Admitting: Internal Medicine

## 2013-10-11 ENCOUNTER — Other Ambulatory Visit: Payer: Medicare Other

## 2013-10-11 ENCOUNTER — Ambulatory Visit (INDEPENDENT_AMBULATORY_CARE_PROVIDER_SITE_OTHER): Payer: Medicare Other

## 2013-10-11 DIAGNOSIS — N179 Acute kidney failure, unspecified: Secondary | ICD-10-CM

## 2013-10-11 DIAGNOSIS — D510 Vitamin B12 deficiency anemia due to intrinsic factor deficiency: Secondary | ICD-10-CM

## 2013-10-11 MED ORDER — CYANOCOBALAMIN 1000 MCG/ML IJ SOLN
1000.0000 ug | Freq: Once | INTRAMUSCULAR | Status: AC
  Administered 2013-10-11: 1000 ug via INTRAMUSCULAR

## 2013-10-12 LAB — MICROALBUMIN / CREATININE URINE RATIO
Creatinine, Ur: 245 mg/dL (ref 15.0–278.0)
MICROALB/CREAT RATIO: 8.8 mg/g creat (ref 0.0–30.0)
Microalbumin, Urine: 21.6 ug/mL — ABNORMAL HIGH (ref 0.0–17.0)

## 2013-10-18 ENCOUNTER — Other Ambulatory Visit: Payer: Medicare Other

## 2013-10-22 ENCOUNTER — Ambulatory Visit
Admission: RE | Admit: 2013-10-22 | Discharge: 2013-10-22 | Disposition: A | Discharge status: Home / Self Care | Payer: Medicare Other | Source: Ambulatory Visit | Attending: Internal Medicine | Admitting: Internal Medicine

## 2013-10-22 DIAGNOSIS — N179 Acute kidney failure, unspecified: Secondary | ICD-10-CM

## 2013-10-24 ENCOUNTER — Other Ambulatory Visit: Payer: Self-pay | Admitting: Internal Medicine

## 2013-10-24 DIAGNOSIS — N289 Disorder of kidney and ureter, unspecified: Secondary | ICD-10-CM

## 2013-10-31 ENCOUNTER — Other Ambulatory Visit: Payer: Medicare Other

## 2013-10-31 DIAGNOSIS — N289 Disorder of kidney and ureter, unspecified: Secondary | ICD-10-CM

## 2013-11-01 ENCOUNTER — Other Ambulatory Visit: Payer: Self-pay | Admitting: *Deleted

## 2013-11-01 ENCOUNTER — Telehealth: Payer: Self-pay | Admitting: *Deleted

## 2013-11-01 ENCOUNTER — Other Ambulatory Visit: Payer: Self-pay | Admitting: Internal Medicine

## 2013-11-01 DIAGNOSIS — R944 Abnormal results of kidney function studies: Secondary | ICD-10-CM

## 2013-11-01 LAB — BASIC METABOLIC PANEL
BUN/Creatinine Ratio: 12 (ref 11–26)
BUN: 19 mg/dL (ref 8–27)
CO2: 21 mmol/L (ref 18–29)
Calcium: 8.6 mg/dL — ABNORMAL LOW (ref 8.7–10.3)
Chloride: 107 mmol/L (ref 97–108)
Creatinine, Ser: 1.63 mg/dL — ABNORMAL HIGH (ref 0.57–1.00)
GFR calc Af Amer: 35 mL/min/{1.73_m2} — ABNORMAL LOW (ref >59)
GFR calc non Af Amer: 30 mL/min/{1.73_m2} — ABNORMAL LOW (ref >59)
Glucose: 75 mg/dL (ref 65–99)
Potassium: 3.5 mmol/L (ref 3.5–5.2)
Sodium: 143 mmol/L (ref 134–144)

## 2013-11-01 NOTE — Telephone Encounter (Signed)
Spoke with patient regarding labs: explained that her kidney function looked a little better, but still elevated much more than it was 5 mos ago. I explained to her that her doctor recommend that she is seen by a nephrologist, but stated that she has questions for Dr. Renato Gailseed. She stated that she wants to know what is going on with her kidneys: has no pain, drinks plenty of water, goes to the restroom. I informed her that I would have Dr. Renato Gailseed call her.

## 2013-11-01 NOTE — Progress Notes (Signed)
Spoke with Alicia Smith.  Explained to her that the ultrasound of her kidneys showed only a cyst.  She had no other findings to explain the elevation of her creatinine that has occurred in the past few months.  She denies using aleve at all since her last visit.  She is agreeable to seeing the nephrologist for further assessment.

## 2013-11-01 NOTE — Telephone Encounter (Signed)
Message copied by RICE, SHARON L on Thu Nov 01, 2013  8:44 AM ------      Message from: REED, NevadaIFFANY L      Created: Thu Nov 01, 2013  8:02 AM       Kidney function looks a little better this time.  Still elevated much more than 5 mos ago.  I recommend she see a nephrologist (kidney specialist). ------

## 2013-11-20 ENCOUNTER — Telehealth: Payer: Self-pay | Admitting: *Deleted

## 2013-11-20 NOTE — Telephone Encounter (Signed)
Patient called and stated that she was still fatigued. Patient has just started on Vitamin B12 injections. Told her she will need to continue them and increase her fluids and follow up if it persist. Patient agreed.

## 2013-12-03 ENCOUNTER — Emergency Department (HOSPITAL_COMMUNITY)
Admission: EM | Admit: 2013-12-03 | Discharge: 2013-12-03 | Disposition: A | Discharge status: Home / Self Care | Payer: Medicare Other | Attending: Emergency Medicine | Admitting: Emergency Medicine

## 2013-12-03 ENCOUNTER — Encounter (HOSPITAL_COMMUNITY): Payer: Self-pay | Admitting: Emergency Medicine

## 2013-12-03 ENCOUNTER — Emergency Department (HOSPITAL_COMMUNITY): Payer: Medicare Other

## 2013-12-03 DIAGNOSIS — Z862 Personal history of diseases of the blood and blood-forming organs and certain disorders involving the immune mechanism: Secondary | ICD-10-CM | POA: Insufficient documentation

## 2013-12-03 DIAGNOSIS — M25551 Pain in right hip: Secondary | ICD-10-CM

## 2013-12-03 DIAGNOSIS — Z87891 Personal history of nicotine dependence: Secondary | ICD-10-CM | POA: Insufficient documentation

## 2013-12-03 DIAGNOSIS — Z79899 Other long term (current) drug therapy: Secondary | ICD-10-CM | POA: Insufficient documentation

## 2013-12-03 DIAGNOSIS — Z8639 Personal history of other endocrine, nutritional and metabolic disease: Secondary | ICD-10-CM | POA: Insufficient documentation

## 2013-12-03 DIAGNOSIS — M25559 Pain in unspecified hip: Secondary | ICD-10-CM | POA: Insufficient documentation

## 2013-12-03 DIAGNOSIS — K219 Gastro-esophageal reflux disease without esophagitis: Secondary | ICD-10-CM | POA: Insufficient documentation

## 2013-12-03 MED ORDER — ONDANSETRON 4 MG PO TBDP: ORAL_TABLET | ORAL | Status: DC

## 2013-12-03 MED ORDER — PREDNISONE 20 MG PO TABS
40.0000 mg | ORAL_TABLET | Freq: Once | ORAL | Status: AC
  Administered 2013-12-03: 40 mg via ORAL
  Filled 2013-12-03: qty 2

## 2013-12-03 MED ORDER — NAPROXEN 375 MG PO TABS: 375.0000 mg | ORAL_TABLET | Freq: Two times a day (BID) | ORAL | Status: DC

## 2013-12-03 MED ORDER — MORPHINE SULFATE 2 MG/ML IJ SOLN
2.0000 mg | Freq: Once | INTRAMUSCULAR | Status: AC
  Administered 2013-12-03: 2 mg via INTRAVENOUS
  Filled 2013-12-03: qty 1

## 2013-12-03 MED ORDER — IBUPROFEN 400 MG PO TABS
400.0000 mg | ORAL_TABLET | Freq: Once | ORAL | Status: AC
  Administered 2013-12-03: 400 mg via ORAL
  Filled 2013-12-03: qty 1

## 2013-12-03 MED ORDER — TRAMADOL HCL 50 MG PO TABS
50.0000 mg | ORAL_TABLET | Freq: Once | ORAL | Status: AC
  Administered 2013-12-03: 50 mg via ORAL
  Filled 2013-12-03: qty 1

## 2013-12-03 MED ORDER — HYDROCODONE-ACETAMINOPHEN 5-325 MG PO TABS: 2.0000 | ORAL_TABLET | ORAL | Status: DC | PRN

## 2013-12-03 MED ORDER — MORPHINE SULFATE 4 MG/ML IJ SOLN
4.0000 mg | Freq: Once | INTRAMUSCULAR | Status: DC
  Filled 2013-12-03: qty 1

## 2013-12-03 MED ORDER — PREDNISONE 10 MG PO TABS: 40.0000 mg | ORAL_TABLET | Freq: Every day | ORAL | Status: DC

## 2013-12-03 NOTE — ED Notes (Signed)
Gave patient a turkey sandwich 

## 2013-12-03 NOTE — ED Notes (Signed)
Pt came to emergency room complaining or pain on her R+ hip 10/10 , pt states this pain started last Friday morning while she was on a yard sale, pt states she is unable to put weight on her right leg, she doesn't have any lost of sensation on her legs or feet, pt states that pain gets better when she keep on the same site or she is lying down and gets worse with movement.

## 2013-12-03 NOTE — ED Notes (Signed)
Patient walked well with walker

## 2013-12-03 NOTE — Progress Notes (Signed)
  CARE MANAGEMENT ED NOTE 12/03/2013  Patient:  Alicia Smith,Alicia Smith   Account Number:  [id number]  Date Initiated:  12/03/2013  Documentation initiated by:  Ferdinand CavaSCHETTINO,Geanette Buonocore  Subjective/Objective Assessment:   78 yo female presenting to the ED with right hip pain     Subjective/Objective Assessment Detail:   Patient is a 78 y.o. female who presents with non traumatic right hip pain.  AF and VS unremarkable during ED visit. PE as above.  DDX included fracture, OA, versus muscle strain.  XRs of right hip and lumbar spine obtained.  She was given tramadol and ibuprofen for pain control.  Review of results shows DGD in lower lumbar spine but otherwise unremarkable.  Patient reassessed and continued to complain of right hip pain while bearing weight.  She was not able to bear weight when asked to ambulate.     Action/Plan:   Action/Plan Detail:   Anticipated DC Date:       Status Recommendation to Physician:   Result of Recommendation:  Agreed    DC Planning Services  Other    Choice offered to / List presented to:  C-1 Patient  DME arranged  [facility]     DME agency  [facility]   HH arranged  HH[facility] PT      [facility]    Status of service:  Completed, signed off  ED Comments:   ED Comments Detail:  CM consulted for resources in the home. Spoke with patient and she agreed to have PT in home once the EDP has diagnosed her and okayed for discharge. CM followed up with patient post treatment. Patient was able to ambulate with walker in the ED and stated that she needs a walker in the home because she lives independently. Also discussed HH PT for follow up care in the home related to right hip pain. Called [facility] rep Jermaine to deliver DME to ED prior to discharge. Also [facility] and [facility] Corrie DandyMary and [facility]. Patient made aware of plan. Staff nurse made aware of plan and also updated EDP Alyson Reedyerrick Kunz.

## 2013-12-03 NOTE — ED Provider Notes (Signed)
CSN: [id number]     Arrival date & time 12/03/13  0930 History   First MD Initiated Contact with Patient 12/03/13 [phone]     Chief Complaint  Patient presents with  . Hip Pain      Patient is a 78 y.o. female presenting with hip pain. The history is provided by the patient and medical records.  Hip Pain This is a new problem. The current episode started in the past 7 days (3 days ago). The problem occurs constantly. The problem has been gradually worsening. Pertinent negatives include no abdominal pain, change in bowel habit, chills, coughing, joint swelling, nausea, neck pain, visual change or weakness. Associated symptoms comments: Pain will occasionally move down back of right leg.  . The symptoms are aggravated by standing, twisting, walking and exertion. She has tried rest, walking, position changes, NSAIDs and lying down for the symptoms. The treatment provided mild relief.    Past Medical History  Diagnosis Date  . High cholesterol   . Sleep apnea     HX OF APNEA LOST WEIGHT AND NO LONGER   . H/O hiatal hernia   . GERD (gastroesophageal reflux disease)   . Pernicious anemia    Past Surgical History  Procedure Laterality Date  . Abdominal hysterectomy  1984    Dr. Gaynell FaceMarshall  . [place]  [address]2005    [place]  [place]  [place]    [place]  . Esophagogastroduodenoscopy Left 05/17/2013    Procedure: ESOPHAGOGASTRODUODENOSCOPY (EGD);  Surgeon: Willis ModenaWilliam Outlaw, MD;  [facility];  Service: Endoscopy;  Laterality: Left;   Family History  Problem Relation Age of Onset  . Cancer Mother   . Cancer Father   . Cancer Sister     breast  . Diabetes Sister   . Hypertension Sister   . Cancer Brother     prostate  . Kidney disease Sister   . Heart disease Brother    History  Substance Use Topics  . Smoking status: Former Smoker    Types: Cigarettes    Quit date: 07/26/1982  . Smokeless tobacco: Never Used  . Alcohol Use: Yes     Comment: Social   OB  History   Grav Para Term Preterm Abortions TAB SAB Ect Mult Living                 Review of Systems  Constitutional: Negative for chills.  Respiratory: Negative for cough.   Gastrointestinal: Negative for nausea, abdominal pain and change in bowel habit.  Musculoskeletal: Negative for joint swelling and neck pain.  Neurological: Negative for weakness.  All other systems reviewed and are negative.     Allergies  Review of patient's allergies indicates no known allergies.  Home Medications   Prior to Admission medications   Medication Sig Start Date End Date Taking? Authorizing Provider  acidophilus (RISAQUAD) CAPS capsule Take 1 capsule by mouth daily.   Yes Historical Provider, MD  naproxen sodium (ANAPROX) 220 MG tablet Take 440 mg by mouth 2 (two) times daily as needed (pain).   Yes Historical Provider, MD  ranitidine (ZANTAC) 150 MG tablet Take 150 mg by mouth as needed for heartburn.   Yes Historical Provider, MD   There were no vitals taken for this visit. Physical Exam  Nursing note and vitals reviewed. Constitutional: She is oriented to person, place, and time. She appears well-developed and well-nourished.  HENT:  Head: Normocephalic and atraumatic.  Right Ear: External ear normal.  Left  Ear: External ear normal.  Mouth/Throat: Oropharynx is clear and moist.  Eyes: Pupils are equal, round, and reactive to light.  Neck: Normal range of motion. Neck supple.  Cardiovascular: Normal rate and normal heart sounds.   Pulmonary/Chest: Effort normal and breath sounds normal.  Abdominal: Soft. Bowel sounds are normal.  Musculoskeletal: She exhibits tenderness (mild TTP over inferior lumbar spine.  ).  Right hip - mild pain with A/PROM of right hip - there is mild TTP over right posterior and lateral hip.  There is no erythema or edema present.  BLEs are equal length and no obvious deformity present.     Negative straight leg test bilaterally.    Neurological: She is alert  and oriented to person, place, and time. She has normal reflexes.  Skin: Skin is warm and dry.  Psychiatric: She has a normal mood and affect.    ED Course  Procedures (including critical care time) Labs Review Labs Reviewed - No data to display  Imaging Review Dg Lumbar Spine Complete  12/03/2013   CLINICAL DATA:  Sudden onset right hip pain and leg pain.  EXAM: LUMBAR SPINE - COMPLETE 4+ VIEW  COMPARISON:  CT 05/15/2013  FINDINGS: Degenerative disc disease and facet disease from L3-4 through L5-S1. Disc space narrowing and anterior spurring noted. Mild diffuse osteopenia. No fracture or malalignment. SI joints are symmetric and unremarkable.  IMPRESSION: Degenerative disc and facet disease in the lower lumbar spine. No acute bony abnormality.   Electronically Signed   By: Charlett NoseKevin  Dover M.D.   On: 12/03/2013 12:12   Dg Hip Complete Right  12/03/2013   CLINICAL DATA:  Right hip pain without injury.  EXAM: RIGHT HIP - COMPLETE 2+ VIEW  COMPARISON:  None.  FINDINGS: There is no evidence of hip fracture or dislocation. No significant joint space narrowing is noted.  IMPRESSION: No significant abnormality seen in the right hip.   Electronically Signed   By: Roque LiasJames  Green M.D.   On: 12/03/2013 12:07     EKG Interpretation None      MDM   Final diagnoses:  Right hip pain    Patient is a 78 y.o. female who presents with non traumatic right hip pain.  AF and VS unremarkable during ED visit.  PE as above.  DDX included fracture, OA, radiculopathy, versus muscle strain.  XRs of right hip and lumbar spine obtained.  She was given tramadol and ibuprofen for pain control.  Review of results shows DGD in lower lumbar spine but otherwise unremarkable.  Patient reassessed and continued to complain of right hip pain while bearing weight.  She was not able to bear weight when asked to ambulate.  IV morphine and prednisone given for further symptomatic relief.  After, patient able to ambulate with minimal  assistance and wanted to go home.  SW/PT consulted and patient discharged with walker, NSAIDs/short course of norco/prednisone, and close PCP follow up.      Johnney Ouerek Jonise Weightman, MD 12/03/13 [phone]

## 2013-12-04 NOTE — ED Provider Notes (Signed)
I saw and evaluated the patient, reviewed the resident's note and I agree with the findings and plan.  Pt c/o right gluteal/sciatic notch area and right hip pain radiating down post/lat thigh. No sts. No skin changes, erythema or rash. No hx ddd.  Denies fall/injury, but had been doing recent bending and lifting in prep for yard/garage sale.   Spine nt. No sts. No skin changes, erythema, induration or shingles/rash. xrays w deg disc dis, deg changes. Motor/sens inact rle. No perineal numbness, no gu c/o. Afeb.     Suzi RootsKevin E Helene Bernstein, MD 12/04/13 [phone]

## 2013-12-05 ENCOUNTER — Telehealth: Payer: Self-pay

## 2013-12-05 DIAGNOSIS — M25559 Pain in unspecified hip: Secondary | ICD-10-CM

## 2013-12-05 DIAGNOSIS — IMO0001 Reserved for inherently not codable concepts without codable children: Secondary | ICD-10-CM

## 2013-12-05 DIAGNOSIS — M5137 Other intervertebral disc degeneration, lumbosacral region: Secondary | ICD-10-CM

## 2013-12-05 DIAGNOSIS — M5430 Sciatica, unspecified side: Secondary | ICD-10-CM

## 2013-12-05 NOTE — Telephone Encounter (Signed)
Please advise Ms. Derr-Penn to avoid using the naprosyn--we just got her to cut back on aleve and the ED now recommended she take naprosyn for her back pain. She may need to be seen before her appt with me in June for further studies of her back. ----- Message ----- From: SYSTEM Sent: 12/03/2013 4:29 PM To: Kermit Baloiffany L Reed, DO   I called patient, patient verbalized understanding of Dr.Reed's recommendations. Patient would like to see Dr.Reed on Monday at 3 pm for ER follow-up and to address pain and treatment. Appointment scheduled

## 2013-12-10 ENCOUNTER — Ambulatory Visit: Payer: Medicare Other | Admitting: Internal Medicine

## 2013-12-13 ENCOUNTER — Ambulatory Visit (INDEPENDENT_AMBULATORY_CARE_PROVIDER_SITE_OTHER): Payer: Medicare Other | Admitting: *Deleted

## 2013-12-13 DIAGNOSIS — E538 Deficiency of other specified B group vitamins: Secondary | ICD-10-CM

## 2013-12-13 MED ORDER — CYANOCOBALAMIN 1000 MCG/ML IJ SOLN
1000.0000 ug | Freq: Once | INTRAMUSCULAR | Status: AC
  Administered 2013-12-13: 1000 ug via INTRAMUSCULAR

## 2013-12-18 ENCOUNTER — Telehealth: Payer: Self-pay | Admitting: *Deleted

## 2013-12-18 NOTE — Telephone Encounter (Signed)
Patient Notified. She has a scheduled appointment

## 2013-12-18 NOTE — Telephone Encounter (Signed)
I am unaware of this numbness being due to b12.  It should actually help numbness.  She needs an exam to rule out carpal tunnel syndrome and possibly a nerve conduction study.

## 2013-12-18 NOTE — Telephone Encounter (Signed)
Patient called and stated that ever since her Vitamin B12 injection her fingers, all 10, goes cold and numb. Could this be from the Vitamin B12. Please Advise.

## 2014-01-03 ENCOUNTER — Ambulatory Visit (INDEPENDENT_AMBULATORY_CARE_PROVIDER_SITE_OTHER): Payer: Medicare Other | Admitting: Internal Medicine

## 2014-01-03 ENCOUNTER — Encounter: Payer: Self-pay | Admitting: Internal Medicine

## 2014-01-03 VITALS — BP 120/68 | HR 84 | Temp 99.3°F | Resp 10 | Wt 144.0 lb

## 2014-01-03 DIAGNOSIS — R5383 Other fatigue: Secondary | ICD-10-CM

## 2014-01-03 DIAGNOSIS — D510 Vitamin B12 deficiency anemia due to intrinsic factor deficiency: Secondary | ICD-10-CM

## 2014-01-03 DIAGNOSIS — R0600 Dyspnea, unspecified: Secondary | ICD-10-CM

## 2014-01-03 DIAGNOSIS — K2990 Gastroduodenitis, unspecified, without bleeding: Secondary | ICD-10-CM

## 2014-01-03 DIAGNOSIS — R0989 Other specified symptoms and signs involving the circulatory and respiratory systems: Secondary | ICD-10-CM

## 2014-01-03 DIAGNOSIS — R0609 Other forms of dyspnea: Secondary | ICD-10-CM

## 2014-01-03 DIAGNOSIS — R5381 Other malaise: Secondary | ICD-10-CM

## 2014-01-03 DIAGNOSIS — K2970 Gastritis, unspecified, without bleeding: Secondary | ICD-10-CM | POA: Insufficient documentation

## 2014-01-03 DIAGNOSIS — Z87891 Personal history of nicotine dependence: Secondary | ICD-10-CM

## 2014-01-03 DIAGNOSIS — N189 Chronic kidney disease, unspecified: Secondary | ICD-10-CM | POA: Insufficient documentation

## 2014-01-03 DIAGNOSIS — R197 Diarrhea, unspecified: Secondary | ICD-10-CM | POA: Insufficient documentation

## 2014-01-03 DIAGNOSIS — K219 Gastro-esophageal reflux disease without esophagitis: Secondary | ICD-10-CM

## 2014-01-03 DIAGNOSIS — R634 Abnormal weight loss: Secondary | ICD-10-CM

## 2014-01-03 NOTE — Progress Notes (Signed)
Patient ID: Alicia Smith, female   DOB: 1936/03/17, 78 y.o.   MRN: [id number]   Location:  [facility]  No Known Allergies  Chief Complaint  Patient presents with  . Follow-up    Patient concerned about weight loss     HPI: Patient is a 78 y.o. black female seen in the office today for acute visit due to continued weight loss.  Still having diarrhea.  Seeing Dr. Bosie Clos the 25th.  Creon was previously stopped.  1-2 bms per day usually, but sometimes up 1/2 of night on account of what she eats.  Still not taking the probiotic.  Says she did take them for a little.  Says everything she eats gives her diarrhea.  Rice and potatoes do ok.  She is going to talk with Dr. Bosie Clos more.  Wonders if she has crohn's disease.  No pain, no blood.  Sometimes after diarrhea, her upper abdomen will hurt across the top.  Has had two episodes of diaphoresis that was severe with abdominal pain:  First after general tso's chicken, second after drinking wine after she finished her last prednisone pill for her sciatica.    Is trying to take her prevacid daily.  EGD 10/14 Dr. Dulce Sellar was unremarkable.  Had just had cscope at Dr. Kenna Gilbert office.    Still not seen by renal.  Apparently she can't be scheduled until July (referral placed in April).  Appetite not great either.  Has to make herself.    Review of Systems:  Review of Systems  Constitutional: Positive for weight loss. Negative for fever, chills and malaise/fatigue.  Eyes: Negative for blurred vision.  Respiratory: Negative for shortness of breath.   Cardiovascular: Negative for chest pain.  Gastrointestinal: Positive for heartburn, abdominal pain and diarrhea. Negative for constipation, blood in stool and melena.  Genitourinary: Negative for dysuria, urgency and frequency.  Musculoskeletal: Negative for falls and myalgias.  Skin: Negative for rash.  Neurological: Negative for dizziness, loss of  consciousness, weakness and headaches.  Psychiatric/Behavioral: Negative for depression and memory loss.    Past Medical History  Diagnosis Date  . High cholesterol   . Sleep apnea     HX OF APNEA LOST WEIGHT AND NO LONGER   . H/O hiatal hernia   . GERD (gastroesophageal reflux disease)   . Pernicious anemia     Past Surgical History  Procedure Laterality Date  . Abdominal hysterectomy  1984    Dr. Gaynell Face  . Cholecystectomy  [address]    [place], [place]  . Cesarean section  1959    [place]  . Esophagogastroduodenoscopy Left 05/17/2013    Procedure: ESOPHAGOGASTRODUODENOSCOPY (EGD);  Surgeon: Willis Modena, MD;  Location: [facility] ENDOSCOPY;  Service: Endoscopy;  Laterality: Left;    Social History:   reports that she quit smoking about 31 years ago. Her smoking use included Cigarettes. She smoked 0.00 packs per day. She has quit using smokeless tobacco. She reports that she drinks alcohol. She reports that she uses illicit drugs (Marijuana).  Family History  Problem Relation Age of Onset  . Cancer Mother   . Cancer Father   . Cancer Sister     breast  . Diabetes Sister   . Hypertension Sister   . Cancer Brother     prostate  . Kidney disease Sister   . Heart disease Brother     Medications: Patient's Medications  New Prescriptions   No medications on file  Previous Medications  ACIDOPHILUS (RISAQUAD) CAPS CAPSULE    Take 1 capsule by mouth daily.   HYDROCODONE-ACETAMINOPHEN (NORCO/VICODIN) 5-325 MG PER TABLET    Take 2 tablets by mouth every 4 (four) hours as needed.   LANSOPRAZOLE (PREVACID) 30 MG CAPSULE    Take 30 mg by mouth daily as needed (acid relfux).   NAPROXEN (NAPROSYN) 375 MG TABLET    Take 1 tablet (375 mg total) by mouth 2 (two) times daily.   NAPROXEN SODIUM (ANAPROX) 220 MG TABLET    Take 440 mg by mouth 2 (two) times daily as needed (pain).   ONDANSETRON (ZOFRAN ODT) 4 MG DISINTEGRATING TABLET    4mg  ODT q4 hours prn nausea/vomit   PREDNISONE  (DELTASONE) 10 MG TABLET    Take 4 tablets (40 mg total) by mouth daily.   RANITIDINE (ZANTAC) 150 MG TABLET    Take 150 mg by mouth as needed for heartburn.  Modified Medications   No medications on file  Discontinued Medications   No medications on file     Physical Exam: Filed Vitals:   01/03/14 1319  BP: 120/68  Pulse: 84  Temp: 99.3 F (37.4 C)  TempSrc: Oral  Resp: 10  Weight: 144 lb (65.318 kg)  Physical Exam  Constitutional: She is oriented to person, place, and time.  Increasingly cachectic appearing female   Cardiovascular: Normal rate, regular rhythm, normal heart sounds and intact distal pulses.   Pulmonary/Chest: Effort normal and breath sounds normal. No respiratory distress.  Abdominal: Soft. Bowel sounds are normal. She exhibits no distension and no mass. There is tenderness. There is no rebound and no guarding.  Slight tenderness in RLQ area; no palpable organomegaly  Musculoskeletal: Normal range of motion. She exhibits no edema and no tenderness.  Neurological: She is alert and oriented to person, place, and time.  Skin: Skin is warm and dry.  Psychiatric:  Tangential      Labs reviewed: Basic Metabolic Panel:  Recent Labs  16/10/96 1035  05/17/13 0500 10/04/13 0822 10/31/13 1104  NA 141  < > 142 143 143  K 4.0  < > 3.4* 3.9 3.5  CL 106  < > 111 107 107  CO2 19  < > 20 18 21   GLUCOSE 78  < > 92 88 75  BUN 19  < > 9 19 19   CREATININE 1.34*  < > 1.07 1.70* 1.63*  CALCIUM 9.1  < > 8.6 9.1 8.6*  TSH 1.880  --   --  2.260  --   < > = values in this interval not displayed. Liver Function Tests:  Recent Labs  03/23/13 1035 04/23/13 1209 05/15/13 1352 10/04/13 0822  AST 10  --  16 19  ALT 5  --  9 14  ALKPHOS 55  --  57 70  BILITOT 0.2  --  0.3 0.2  PROT 6.7 7.6 7.0 6.8  ALBUMIN  --   --  3.6  --   CBC:  Recent Labs  03/23/13 1035  05/15/13 1352 05/16/13 0455 05/17/13 0500 10/04/13 0822  WBC 5.6  < > 5.7 4.6 4.1 6.6  NEUTROABS  2.8  --  2.9  --   --  3.7  HGB 11.9  --  11.2* 10.8* 11.1* 11.0*  HCT 36.8  --  34.3* 32.3* 33.6* 34.0  MCV 82  --  82.1 80.3 82.0 84  PLT  --   --  158 168 159  --   < > = values in this  interval not displayed. Lipid Panel:  Recent Labs  03/23/13 1035 10/04/13 0822  HDL 42 51  LDLCALC 94 114*  TRIG 92 98  CHOLHDL 3.7 3.6   Lab Results  Component Value Date   HGBA1C 5.2 10/04/2013    Assessment/Plan 1. Loss of weight - likely due to continued diarrhea -pt still not consistently adherent with probiotic -previously stopped creon b/c she felt it only worked a short time -did complete course of treatment for h pylori with negative f/u antibody - will reevaluate full imaging due to ongoing weight loss -CT Abdomen Pelvis W Contrast; Future - CT Chest W Contrast; Future  2. Gastritis - persists, using prevacid and also zantac with attacks - CT Abdomen Pelvis W Contrast; Future  3. GERD (gastroesophageal reflux disease) - as above - CT Abdomen Pelvis W Contrast; Future - Comprehensive metabolic panel  4. Chronic kidney disease - f/u bmp, nephrology has yet to schedule her an appt--we were told she could not be seen until 3 mos from when we called in April which would be July but no appt date/time given to her -needs to see them -again reminded to avoid nsaids (reviewed which meds these are)  5. Diarrhea -persists, r/o usual suspects with stool culture, stool for c diff--specimen cups provided and phlebotomist explained use to patient - CT Abdomen Pelvis W Contrast; Future - Comprehensive metabolic panel  6. Pernicious anemia - on monthly b12 shots - CBC With differential/Platelet  7. History of tobacco abuse - stopped last year - continues to lose weight which is concerning for malignancy esp in this context - CT Chest W Contrast; Future  8. Other malaise and fatigue - remains, likely due to persistent diarrhea, but could be malignancy - CT Chest W Contrast;  Future  9. Dyspnea - prior tobacco abuse also - CT Chest W Contrast; Future  Labs/tests ordered: Orders Placed This Encounter  Procedures  . CT Abdomen Pelvis W Contrast    Standing Status: Future     Number of Occurrences:      Standing Expiration Date: 04/06/2015    Order Specific Question:  Reason for Exam (SYMPTOM  OR DIAGNOSIS REQUIRED)    Answer:  weight loss persists, diarrhea    Order Specific Question:  Preferred imaging location?    Answer:  [facility]  . CT Chest W Contrast    Standing Status: Future     Number of Occurrences:      Standing Expiration Date: 03/06/2015    Order Specific Question:  Reason for Exam (SYMPTOM  OR DIAGNOSIS REQUIRED)    Answer:  weight loss, previous smoker    Order Specific Question:  Preferred imaging location?    Answer:  [facility]  . CBC With differential/Platelet  . Comprehensive metabolic panel    Next appt:  3 mos, but sooner if tests abnormal, has GI f/u upcoming

## 2014-01-04 ENCOUNTER — Telehealth: Payer: Self-pay | Admitting: *Deleted

## 2014-01-04 LAB — CBC WITH DIFFERENTIAL
Basophils Absolute: 0 10*3/uL (ref 0.0–0.2)
Basos: 1 %
Eos: 2 %
Eosinophils Absolute: 0.1 10*3/uL (ref 0.0–0.4)
HCT: 32.7 % — ABNORMAL LOW (ref 34.0–46.6)
Hemoglobin: 10.6 g/dL — ABNORMAL LOW (ref 11.1–15.9)
Immature Grans (Abs): 0 10*3/uL (ref 0.0–0.1)
Immature Granulocytes: 0 %
Lymphocytes Absolute: 2.6 10*3/uL (ref 0.7–3.1)
Lymphs: 47 %
MCH: 26.8 pg (ref 26.6–33.0)
MCHC: 32.4 g/dL (ref 31.5–35.7)
MCV: 83 fL (ref 79–97)
Monocytes Absolute: 0.4 10*3/uL (ref 0.1–0.9)
Monocytes: 8 %
Neutrophils Absolute: 2.3 10*3/uL (ref 1.4–7.0)
Neutrophils Relative %: 42 %
Platelets: 276 10*3/uL (ref 150–379)
RBC: 3.95 x10E6/uL (ref 3.77–5.28)
RDW: 14.6 % (ref 12.3–15.4)
WBC: 5.6 10*3/uL (ref 3.4–10.8)

## 2014-01-04 LAB — COMPREHENSIVE METABOLIC PANEL
ALT: 5 IU/L (ref 0–32)
AST: 11 IU/L (ref 0–40)
Albumin/Globulin Ratio: 1.6 (ref 1.1–2.5)
Albumin: 3.8 g/dL (ref 3.5–4.8)
Alkaline Phosphatase: 55 IU/L (ref 39–117)
BUN/Creatinine Ratio: 13 (ref 11–26)
BUN: 18 mg/dL (ref 8–27)
CO2: 17 mmol/L — ABNORMAL LOW (ref 18–29)
Calcium: 8.5 mg/dL — ABNORMAL LOW (ref 8.7–10.3)
Chloride: 109 mmol/L — ABNORMAL HIGH (ref 97–108)
Creatinine, Ser: 1.34 mg/dL — ABNORMAL HIGH (ref 0.57–1.00)
GFR calc Af Amer: 44 mL/min/{1.73_m2} — ABNORMAL LOW (ref >59)
GFR calc non Af Amer: 39 mL/min/{1.73_m2} — ABNORMAL LOW (ref >59)
Globulin, Total: 2.4 g/dL (ref 1.5–4.5)
Glucose: 78 mg/dL (ref 65–99)
Potassium: 3.8 mmol/L (ref 3.5–5.2)
Sodium: 141 mmol/L (ref 134–144)
Total Bilirubin: 0.3 mg/dL (ref 0.0–1.2)
Total Protein: 6.2 g/dL (ref 6.0–8.5)

## 2014-01-04 NOTE — Telephone Encounter (Signed)
Message copied by Lamont SnowballICE, SHARON L on Fri Jan 04, 2014  4:47 PM ------      Message from: DunellenREED, NevadaIFFANY L      Created: Fri Jan 04, 2014  2:59 PM       Slight decrease in blood counts since last visit.  Kidney function is slightly improved.  I would like her to have a few other labs:  Amylase, lipase, PTH, 1,25 hydroxy vitamin D.  I doubt they can be added, but please check with Mel.  If not, I will need her to come in to get more blood tests. ------

## 2014-01-04 NOTE — Telephone Encounter (Signed)
Call patient with results of labs, she stated that she understood and will be looking for us call her back. SRice RMA

## 2014-01-05 LAB — SPECIMEN STATUS REPORT

## 2014-01-08 ENCOUNTER — Telehealth: Payer: Self-pay | Admitting: *Deleted

## 2014-01-08 LAB — LIPASE: Lipase: 59 U/L (ref 0–59)

## 2014-01-08 LAB — AMYLASE: Amylase: 172 U/L — ABNORMAL HIGH (ref 31–124)

## 2014-01-08 LAB — SPECIMEN STATUS REPORT

## 2014-01-08 LAB — VITAMIN D 1,25 DIHYDROXY: Vit D, 1,25-Dihydroxy: 41 pg/mL (ref 19.9–79.3)

## 2014-01-08 NOTE — Telephone Encounter (Signed)
Message copied by Lamont SnowballICE, SHARON L on Tue Jan 08, 2014  9:13 AM ------      Message from: [place], Gwenith SpitzIFFANY L      Created: Tue Jan 08, 2014  8:06 AM       Vitamin D normal.  Lipase normal which goes against pancreatitis. ------

## 2014-01-08 NOTE — Telephone Encounter (Signed)
Patient returned call and stated that she understood the results.

## 2014-01-08 NOTE — Telephone Encounter (Signed)
Called patient to give results, on Vitamin D level. LM for her to call back.

## 2014-01-08 NOTE — Telephone Encounter (Signed)
Message copied by Lamont SnowballICE, SHARON L on Tue Jan 08, 2014 10:34 AM ------      Message from: [place]egerREED, Gwenith SpitzIFFANY L      Created: Tue Jan 08, 2014  8:06 AM       Vitamin D normal.  Lipase normal which goes against pancreatitis. ------

## 2014-01-10 ENCOUNTER — Inpatient Hospital Stay (HOSPITAL_COMMUNITY)
Admission: EM | Admit: 2014-01-10 | Discharge: 2014-01-12 | DRG: 390 | Disposition: A | Discharge status: Home / Self Care | Payer: Medicare Other | Attending: Internal Medicine | Admitting: Internal Medicine

## 2014-01-10 ENCOUNTER — Telehealth: Payer: Self-pay | Admitting: *Deleted

## 2014-01-10 ENCOUNTER — Ambulatory Visit
Admission: RE | Admit: 2014-01-10 | Discharge: 2014-01-10 | Disposition: A | Discharge status: Home / Self Care | Payer: Medicare Other | Source: Ambulatory Visit | Attending: Internal Medicine | Admitting: Internal Medicine

## 2014-01-10 ENCOUNTER — Encounter (HOSPITAL_COMMUNITY): Payer: Self-pay | Admitting: Emergency Medicine

## 2014-01-10 DIAGNOSIS — Z87891 Personal history of nicotine dependence: Secondary | ICD-10-CM

## 2014-01-10 DIAGNOSIS — K219 Gastro-esophageal reflux disease without esophagitis: Secondary | ICD-10-CM | POA: Diagnosis present

## 2014-01-10 DIAGNOSIS — I4891 Unspecified atrial fibrillation: Secondary | ICD-10-CM

## 2014-01-10 DIAGNOSIS — E876 Hypokalemia: Secondary | ICD-10-CM

## 2014-01-10 DIAGNOSIS — R5383 Other fatigue: Secondary | ICD-10-CM

## 2014-01-10 DIAGNOSIS — Z8249 Family history of ischemic heart disease and other diseases of the circulatory system: Secondary | ICD-10-CM

## 2014-01-10 DIAGNOSIS — IMO0002 Reserved for concepts with insufficient information to code with codable children: Secondary | ICD-10-CM

## 2014-01-10 DIAGNOSIS — R1011 Right upper quadrant pain: Secondary | ICD-10-CM

## 2014-01-10 DIAGNOSIS — K2970 Gastritis, unspecified, without bleeding: Secondary | ICD-10-CM

## 2014-01-10 DIAGNOSIS — R197 Diarrhea, unspecified: Secondary | ICD-10-CM

## 2014-01-10 DIAGNOSIS — R5381 Other malaise: Secondary | ICD-10-CM

## 2014-01-10 DIAGNOSIS — D510 Vitamin B12 deficiency anemia due to intrinsic factor deficiency: Secondary | ICD-10-CM

## 2014-01-10 DIAGNOSIS — N183 Chronic kidney disease, stage 3 unspecified: Secondary | ICD-10-CM

## 2014-01-10 DIAGNOSIS — E860 Dehydration: Secondary | ICD-10-CM

## 2014-01-10 DIAGNOSIS — R0600 Dyspnea, unspecified: Secondary | ICD-10-CM

## 2014-01-10 DIAGNOSIS — R634 Abnormal weight loss: Secondary | ICD-10-CM | POA: Diagnosis present

## 2014-01-10 DIAGNOSIS — E7800 Pure hypercholesterolemia, unspecified: Secondary | ICD-10-CM | POA: Diagnosis present

## 2014-01-10 DIAGNOSIS — I480 Paroxysmal atrial fibrillation: Secondary | ICD-10-CM

## 2014-01-10 DIAGNOSIS — F1210 Cannabis abuse, uncomplicated: Secondary | ICD-10-CM | POA: Diagnosis present

## 2014-01-10 DIAGNOSIS — K668 Other specified disorders of peritoneum: Secondary | ICD-10-CM

## 2014-01-10 DIAGNOSIS — Z833 Family history of diabetes mellitus: Secondary | ICD-10-CM

## 2014-01-10 DIAGNOSIS — K56609 Unspecified intestinal obstruction, unspecified as to partial versus complete obstruction: Principal | ICD-10-CM

## 2014-01-10 DIAGNOSIS — K529 Noninfective gastroenteritis and colitis, unspecified: Secondary | ICD-10-CM

## 2014-01-10 LAB — COMPREHENSIVE METABOLIC PANEL
ALBUMIN: 3.7 g/dL (ref 3.5–5.2)
ALK PHOS: 64 U/L (ref 39–117)
ALT: 7 U/L (ref 0–35)
AST: 15 U/L (ref 0–37)
BUN: 20 mg/dL (ref 6–23)
CALCIUM: 9 mg/dL (ref 8.4–10.5)
CO2: 16 mEq/L — ABNORMAL LOW (ref 19–32)
Chloride: 104 mEq/L (ref 96–112)
Creatinine, Ser: 1.2 mg/dL — ABNORMAL HIGH (ref 0.50–1.10)
GFR calc Af Amer: 50 mL/min — ABNORMAL LOW (ref >90)
GFR calc non Af Amer: 43 mL/min — ABNORMAL LOW (ref >90)
Glucose, Bld: 89 mg/dL (ref 70–99)
POTASSIUM: 3.2 meq/L — AB (ref 3.7–5.3)
SODIUM: 140 meq/L (ref 137–147)
TOTAL PROTEIN: 7.5 g/dL (ref 6.0–8.3)
Total Bilirubin: 0.3 mg/dL (ref 0.3–1.2)

## 2014-01-10 LAB — CBC WITH DIFFERENTIAL/PLATELET
BASOS ABS: 0.1 10*3/uL (ref 0.0–0.1)
BASOS PCT: 1 % (ref 0–1)
EOS ABS: 0.1 10*3/uL (ref 0.0–0.7)
EOS PCT: 1 % (ref 0–5)
HCT: 34.6 % — ABNORMAL LOW (ref 36.0–46.0)
Hemoglobin: 11.3 g/dL — ABNORMAL LOW (ref 12.0–15.0)
Lymphocytes Relative: 44 % (ref 12–46)
Lymphs Abs: 2.7 10*3/uL (ref 0.7–4.0)
MCH: 27.1 pg (ref 26.0–34.0)
MCHC: 32.7 g/dL (ref 30.0–36.0)
MCV: 83 fL (ref 78.0–100.0)
Monocytes Absolute: 0.3 10*3/uL (ref 0.1–1.0)
Monocytes Relative: 5 % (ref 3–12)
NEUTROS PCT: 49 % (ref 43–77)
Neutro Abs: 3 10*3/uL (ref 1.7–7.7)
PLATELETS: 247 10*3/uL (ref 150–400)
RBC: 4.17 MIL/uL (ref 3.87–5.11)
RDW: 15 % (ref 11.5–15.5)
WBC: 6.2 10*3/uL (ref 4.0–10.5)

## 2014-01-10 LAB — I-STAT CG4 LACTIC ACID, ED: Lactic Acid, Venous: 0.58 mmol/L (ref 0.5–2.2)

## 2014-01-10 LAB — LIPASE, BLOOD: Lipase: 78 U/L — ABNORMAL HIGH (ref 11–59)

## 2014-01-10 MED ORDER — IOHEXOL 300 MG/ML  SOLN
100.0000 mL | Freq: Once | INTRAMUSCULAR | Status: AC | PRN
  Administered 2014-01-10: 100 mL via INTRAVENOUS

## 2014-01-10 NOTE — ED Provider Notes (Signed)
  Face-to-face evaluation   History: She saw her doctor recently for losing weight, and the doctor ordered a CT scan. It was done, today, and shows small bowel obstruction, and intraperitoneal free air. She states that she was able to eat today, and is passing stools, and indeed, has passed 2 stools in the emergency department. Her stools are loose. She has similar episode 2 years ago, and did not require surgery, for it.  Physical exam: Elderly, alert female in no apparent distress. He is somewhat distended and has mild, diffuse tenderness. There is no rebound tenderness.  Medical screening examination/treatment/procedure(s) were conducted as a shared visit with non-physician practitioner(s) and myself.  I personally evaluated the patient during the encounter  Flint MelterElliott L Wentz, MD 01/11/14 [id number]

## 2014-01-10 NOTE — ED Provider Notes (Signed)
CSN: [id number]     Arrival date & time 01/10/14  1640 History   First MD Initiated Contact with Patient 01/10/14 2002     Chief Complaint  Patient presents with  . Bloated    CT from earlier today shows free air  . Abdominal Pain     (Consider location/radiation/quality/duration/timing/severity/associated sxs/prior Treatment) HPI Comments: The patient is a 78 year old female sent in from CT do to small bowel obstruction and free intraperitoneal air is seen on an outpatient CT. The patient reports CT was ordered by her PCP for persistent diarrhea, weight loss for the past several months. The patient reports persistent diarrhea since October, had one bowel movement prior to CT, one bowel movement after CT and approximately 3 episodes of loose, frothy stools since.  The patient reports abdominal discomfort described as "gurgling" and soreness with palpation to the right upper side.  She reports abdominal distention, partially resolving since multiple episodes of loose stool.  She denies vomiting. The patient's abdominal surgery history includes laparoscopic cholecystectomy in 2005, hysterectomy in 1984, and c-section.  Last oral intake 0800 today. Upon further questioning the patient reports while undergoing her previous endoscopy she had "fluttering "of the heart and was sent to Dr. Jacinto HalimGanji for further evaluation, she reports multiple tests were performed but is unaware of the results. PCP: Bufford SpikesEED, TIFFANY, DO Cardiologist: Delrae RendJagadeesh Ganji WU:[id number]:Schooler   Patient is a [age] y.o. female presenting with abdominal pain. The history is provided by the patient. No language interpreter was used.  Abdominal Pain Associated symptoms: diarrhea   Associated symptoms: no chest pain, no chills, no constipation, no fever, no nausea, no shortness of breath and no vomiting     Past Medical History  Diagnosis Date  . High cholesterol   . Sleep apnea     HX OF APNEA LOST WEIGHT AND NO LONGER   . H/O hiatal hernia    . GERD (gastroesophageal reflux disease)   . Pernicious anemia    Past Surgical History  Procedure Laterality Date  . Abdominal hysterectomy  1984    Dr. Gaynell FaceMarshall  . [place]  [address]2005    [place]  . [place]arean section  1959    [place]  . Esophagogastroduodenoscopy Left 05/17/2013    Procedure: ESOPHAGOGASTRODUODENOSCOPY (EGD);  Surgeon: Willis ModenaWilliam Outlaw, MD;  [facility];  Service: Endoscopy;  Laterality: Left;   Family History  Problem Relation Age of Onset  . Cancer Mother   . Cancer Father   . Cancer Sister     breast  . Diabetes Sister   . Hypertension Sister   . Cancer Brother     prostate  . Kidney disease Sister   . Heart disease Brother    History  Substance Use Topics  . Smoking status: Former Smoker    Types: Cigarettes    Quit date: 07/26/1982  . Smokeless tobacco: Former NeurosurgeonUser  . Alcohol Use: Yes     Comment: Social   OB History   Grav Para Term Preterm Abortions TAB SAB Ect Mult Living                 Review of Systems  Constitutional: Negative for fever and chills.  Respiratory: Negative for shortness of breath.   Cardiovascular: Negative for chest pain.  Gastrointestinal: Positive for abdominal pain, diarrhea and abdominal distention. Negative for nausea, vomiting, constipation and blood in stool.  All other systems reviewed and are negative.     Allergies  Review of patient's allergies  indicates no known allergies.  Home Medications   Prior to Admission medications   Medication Sig Start Date End Date Taking? Authorizing Provider  lansoprazole (PREVACID) 30 MG capsule Take 30 mg by mouth daily as needed (acid relfux).   Yes Historical Provider, MD  Probiotic Product (PROBIOTIC DAILY PO) Take 1 capsule by mouth daily.   Yes Historical Provider, MD  ranitidine (ZANTAC) 150 MG tablet Take 150 mg by mouth as needed for heartburn.   Yes Historical Provider, MD   BP 120/73  Pulse 110  Temp(Src) 98.3 F (36.8 C) (Oral)   Resp 15  SpO2 99% Physical Exam  Nursing note and vitals reviewed. Constitutional: She is oriented to person, place, and time. She appears well-developed and well-nourished.  Non-toxic appearance. She does not have a sickly appearance. She does not appear ill. No distress.  Elderly female  HENT:  Head: Normocephalic and atraumatic.  Eyes: EOM are normal. Pupils are equal, round, and reactive to light. Right eye exhibits no discharge. Left eye exhibits no discharge. No scleral icterus.  Neck: Normal range of motion. Neck supple.  Cardiovascular: Normal rate and regular rhythm.   No murmur heard. No lower extremity edema  Pulmonary/Chest: Effort normal and breath sounds normal. She has no wheezes. She has no rales. She exhibits no tenderness.  Abdominal: Soft. Bowel sounds are normal. She exhibits distension. There is tenderness. There is no rigidity, no rebound and no guarding.    Minimal tenderness to the right upper quadrant. Generalized distention  Musculoskeletal: Normal range of motion. She exhibits no edema.  Neurological: She is alert and oriented to person, place, and time.  Skin: Skin is warm and dry. No rash noted. She is not diaphoretic.  Psychiatric: She has a normal mood and affect. Her behavior is normal. Thought content normal.    ED Course  Procedures (including critical care time) Labs Review Labs Reviewed  CBC WITH DIFFERENTIAL - Abnormal; Notable for the following:    Hemoglobin 11.3 (*)    HCT 34.6 (*)    All other components within normal limits  COMPREHENSIVE METABOLIC PANEL - Abnormal; Notable for the following:    Potassium 3.2 (*)    CO2 16 (*)    Creatinine, Ser 1.20 (*)    GFR calc non Af Amer 43 (*)    GFR calc Af Amer 50 (*)    All other components within normal limits  LIPASE, BLOOD - Abnormal; Notable for the following:    Lipase 78 (*)    All other components within normal limits  URINALYSIS, ROUTINE W REFLEX MICROSCOPIC  I-STAT CG4 LACTIC  ACID, ED    Imaging Review Ct Chest W Contrast  01/10/2014   CLINICAL DATA:  Persistent weight loss. Mid to lower abdominal pain. Diarrhea. Abdominal distention.  EXAM: CT CHEST, ABDOMEN, AND PELVIS WITH CONTRAST  TECHNIQUE: Multidetector CT imaging of the chest, abdomen and pelvis was performed following the standard protocol during bolus administration of intravenous contrast.  CONTRAST:  100 mL of Omnipaque 300 intravenous contrast.  COMPARISON:  CT, 05/15/2013.  FINDINGS: CT CHEST FINDINGS  No neck base or axillary masses or pathologically enlarged lymph nodes.  Heart is normal in size. Minimal coronary artery calcifications. Great vessels are normal in caliber. No mediastinal or hilar masses or adenopathy.  Mild lung base subsegmental atelectasis and/ scarring, greater on the left. 4 mm nodule in the lateral left lower lobe, image 31, series 3. There are few other tiny nodules bilaterally. No lung consolidation  or edema. No pleural effusion or pneumothorax.  CT ABDOMEN AND PELVIS FINDINGS  Moderate amount of free intraperitoneal air primarily collecting anteriorly. Most of the small bowel is dilated. Maximum diameter of the small bowel is approximately 5.8 cm. There is decompressed distal ileum and a possible transition point in the central abdomen. There is an abnormal position of the ligament of Treitz, with the third portion and fourth portion of the duodenum extending around the superior mesenteric vessels to lie in the right mid abdomen. This change in position may be postsurgical. This apparent small bowel obstruction may be on the basis of adhesions. No bowel mass is seen. There is no wall thickening. No pneumatosis intestinalis is visualized.  The colon is mostly decompressed. There are diverticula along the sigmoid colon. No diverticulitis. A normal appendix is visualized.  Liver is normal in size. No liver mass or focal lesion. Normal spleen. Gallbladder surgically absent. No bile duct dilation.  Normal pancreas. No adrenal masses. Stable 6.2 cm left renal cyst. Mild renal cortical thinning. No other renal abnormalities. No hydronephrosis. Ureters are not well visualized. Bladder is mostly decompressed but otherwise unremarkable.  Uterus is surgically absent.  No pelvic masses.  No pathologically enlarged lymph nodes.  Small to moderate amount of ascites.  Bony structures are demineralized. There are degenerative changes throughout the visualized spine. No osteoblastic or osteolytic lesions.  IMPRESSION: 1. Moderate amount of free intraperitoneal air suggesting a perforated viscus. This is similar to the appearance on the prior CT. 2. Small bowel obstruction with an apparent transition point in the right central abdomen suggesting obstruction based on adhesions. No pneumatosis intestinalis. No bowel wall thickening to suggest ischemia. Distal ileum is decompressed. Colon is mostly decompressed. 3. Small to moderate amount of ascites. 4. Few small pulmonary nodules, the largest a 4 mm nodule in the lateral left lower lobe, which is stable from the prior exam strongly suggesting a benign etiology. Mild lung base subsegmental atelectasis and/or scarring. 5. No CT evidence of malignancy. Patient's referring physician's office was contacted with this report at the time of this dictation.   Electronically Signed   By: Amie Portland M.D.   On: 01/10/2014 15:08   Ct Abdomen Pelvis W Contrast  01/10/2014   CLINICAL DATA:  Persistent weight loss. Mid to lower abdominal pain. Diarrhea. Abdominal distention.  EXAM: CT CHEST, ABDOMEN, AND PELVIS WITH CONTRAST  TECHNIQUE: Multidetector CT imaging of the chest, abdomen and pelvis was performed following the standard protocol during bolus administration of intravenous contrast.  CONTRAST:  100 mL of Omnipaque 300 intravenous contrast.  COMPARISON:  CT, 05/15/2013.  FINDINGS: CT CHEST FINDINGS  No neck base or axillary masses or pathologically enlarged lymph nodes.  Heart  is normal in size. Minimal coronary artery calcifications. Great vessels are normal in caliber. No mediastinal or hilar masses or adenopathy.  Mild lung base subsegmental atelectasis and/ scarring, greater on the left. 4 mm nodule in the lateral left lower lobe, image 31, series 3. There are few other tiny nodules bilaterally. No lung consolidation or edema. No pleural effusion or pneumothorax.  CT ABDOMEN AND PELVIS FINDINGS  Moderate amount of free intraperitoneal air primarily collecting anteriorly. Most of the small bowel is dilated. Maximum diameter of the small bowel is approximately 5.8 cm. There is decompressed distal ileum and a possible transition point in the central abdomen. There is an abnormal position of the ligament of Treitz, with the third portion and fourth portion of the duodenum extending around  the superior mesenteric vessels to lie in the right mid abdomen. This change in position may be postsurgical. This apparent small bowel obstruction may be on the basis of adhesions. No bowel mass is seen. There is no wall thickening. No pneumatosis intestinalis is visualized.  The colon is mostly decompressed. There are diverticula along the sigmoid colon. No diverticulitis. A normal appendix is visualized.  Liver is normal in size. No liver mass or focal lesion. Normal spleen. Gallbladder surgically absent. No bile duct dilation. Normal pancreas. No adrenal masses. Stable 6.2 cm left renal cyst. Mild renal cortical thinning. No other renal abnormalities. No hydronephrosis. Ureters are not well visualized. Bladder is mostly decompressed but otherwise unremarkable.  Uterus is surgically absent.  No pelvic masses.  No pathologically enlarged lymph nodes.  Small to moderate amount of ascites.  Bony structures are demineralized. There are degenerative changes throughout the visualized spine. No osteoblastic or osteolytic lesions.  IMPRESSION: 1. Moderate amount of free intraperitoneal air suggesting a  perforated viscus. This is similar to the appearance on the prior CT. 2. Small bowel obstruction with an apparent transition point in the right central abdomen suggesting obstruction based on adhesions. No pneumatosis intestinalis. No bowel wall thickening to suggest ischemia. Distal ileum is decompressed. Colon is mostly decompressed. 3. Small to moderate amount of ascites. 4. Few small pulmonary nodules, the largest a 4 mm nodule in the lateral left lower lobe, which is stable from the prior exam strongly suggesting a benign etiology. Mild lung base subsegmental atelectasis and/or scarring. 5. No CT evidence of malignancy. Patient's referring physician's office was contacted with this report at the time of this dictation.   Electronically Signed   By: Amie Portland M.D.   On: 01/10/2014 15:08     EKG Interpretation None      MDM   Final diagnoses:  SBO (small bowel obstruction)  Chronic kidney disease, stage 3 (moderate)  Paroxysmal atrial fibrillation   The patient is a 77 her old female presenting after outside CT shows intraperitoneal air or and small bowel obstruction. On exam she has minimal right upper quadrant tenderness, abdominal distention, afebrile. She has had multiple bowel movements in the ED. Will consult with GI and surgery for further management of the patient's problems at this time.  Dr. Luisa Hart agreed to evaluate the patient in the emergency department.  Dr. was also evaluated the patient in emergency department. Dr. Luisa Hart evaluated the patient in the ED and states no emergent procedure needed performed at this time and will continue to follow the patient advises medical admission, also states that the patient was in A. fib when he was evaluating the patient. Patient denies history of atrial fibrillation but reports she has been evaluated by Jacinto Halim, for some "fluttering" in the past. No previous EKG or cardiac evaluation on EMR to confirm. Discussed patient history,  condition with Dr. Elisabeth Pigeon who agrees to admit the patient for further management of her abdominal distention, A. fib.  Meds given in ED:  Medications  sodium chloride 0.9 % injection 3 mL (3 mLs Intravenous Not Given 01/11/14 0045)  0.9 %  sodium chloride infusion ( Intravenous New Bag/Given 01/11/14 0116)  acetaminophen (TYLENOL) tablet 650 mg (not administered)    Or  acetaminophen (TYLENOL) suppository 650 mg (not administered)  ondansetron (ZOFRAN) tablet 4 mg (not administered)    Or  ondansetron (ZOFRAN) injection 4 mg (not administered)    New Prescriptions   No medications on file  Clabe Seal, PA-C 01/11/14 [phone]

## 2014-01-10 NOTE — ED Notes (Signed)
Pt sent from MD office after having CT of chest and abd done earlier today. States she has been having loose stools for a while. Feels bloated after drinking contrast earlier.

## 2014-01-10 NOTE — Telephone Encounter (Signed)
[facility] called with call report for patient's CT abdomen/Pevic. Given to Dr. Renato Gailseed to review and it shows a small bowel Obstruction. Dr. Renato Gailseed wants patient to go to the hospital. Clydie BraunKaren informed and she will send patient.

## 2014-01-10 NOTE — ED Notes (Signed)
Pt states remains unable to void at present pt has been npo since 0800 today due to scheduled abd ct exam

## 2014-01-10 NOTE — ED Notes (Signed)
Removed pt from bedpan loose light brown stool noted pt passing frequent flatus

## 2014-01-10 NOTE — ED Notes (Signed)
CG-4 results reported to Murphy OilLauren Parker-PA

## 2014-01-10 NOTE — Consult Note (Signed)
Reason for Consult:free air onCT Referring Physician: Eulis Foster MD  Alicia Smith is an 78 y.o. female.  HPI: Asked to see pt  At the request of Dr Eulis Foster for free air on CT.  Pt has a hx since last fall of weight loss,  Diarrhea and had free air on CT from 10.2014.  Pt evaluated by GI and surgery and no cause identified.  Pt denies any significant abdominal pain.  Has a tender area RUQ that has been present for over a week.  CT ordered by primary care for weight loss and diarrhea 1 week ago.  When results reviewed pt told to go to ED.  Pt states that the weight loss and diarrhea bother her the  Most.  Pain not an issue.  Past Medical History  Diagnosis Date  . High cholesterol   . Sleep apnea     HX OF APNEA LOST WEIGHT AND NO LONGER   . H/O hiatal hernia   . GERD (gastroesophageal reflux disease)   . Pernicious anemia     Past Surgical History  Procedure Laterality Date  . Abdominal hysterectomy  1984    Dr. Ruthann Cancer  . Cholecystectomy  [address]    [place], [place]  . Cesarean section  1959    [place]  . Esophagogastroduodenoscopy Left 05/17/2013    Procedure: ESOPHAGOGASTRODUODENOSCOPY (EGD);  Surgeon: Arta Silence, MD;  Location: [facility] ENDOSCOPY;  Service: Endoscopy;  Laterality: Left;    Family History  Problem Relation Age of Onset  . Cancer Mother   . Cancer Father   . Cancer Sister     breast  . Diabetes Sister   . Hypertension Sister   . Cancer Brother     prostate  . Kidney disease Sister   . Heart disease Brother     Social History:  reports that she quit smoking about 31 years ago. Her smoking use included Cigarettes. She smoked 0.00 packs per day. She has quit using smokeless tobacco. She reports that she drinks alcohol. She reports that she uses illicit drugs (Marijuana).  Allergies: No Known Allergies  Medications: I have reviewed the patient's current medications.  Results for orders placed during the hospital encounter of 01/10/14 (from the past 48 hour(s))   CBC WITH DIFFERENTIAL     Status: Abnormal   Collection Time    01/10/14  5:05 PM      Result Value Ref Range   WBC 6.2  4.0 - 10.5 K/uL   RBC 4.17  3.87 - 5.11 MIL/uL   Hemoglobin 11.3 (*) 12.0 - 15.0 g/dL   HCT 34.6 (*) 36.0 - 46.0 %   MCV 83.0  78.0 - 100.0 fL   MCH 27.1  26.0 - 34.0 pg   MCHC 32.7  30.0 - 36.0 g/dL   RDW 15.0  11.5 - 15.5 %   Platelets 247  150 - 400 K/uL   Neutrophils Relative % 49  43 - 77 %   Neutro Abs 3.0  1.7 - 7.7 K/uL   Lymphocytes Relative 44  12 - 46 %   Lymphs Abs 2.7  0.7 - 4.0 K/uL   Monocytes Relative 5  3 - 12 %   Monocytes Absolute 0.3  0.1 - 1.0 K/uL   Eosinophils Relative 1  0 - 5 %   Eosinophils Absolute 0.1  0.0 - 0.7 K/uL   Basophils Relative 1  0 - 1 %   Basophils Absolute 0.1  0.0 - 0.1 K/uL  COMPREHENSIVE METABOLIC  PANEL     Status: Abnormal   Collection Time    01/10/14  5:05 PM      Result Value Ref Range   Sodium 140  137 - 147 mEq/L   Potassium 3.2 (*) 3.7 - 5.3 mEq/L   Chloride 104  96 - 112 mEq/L   CO2 16 (*) 19 - 32 mEq/L   Glucose, Bld 89  70 - 99 mg/dL   BUN 20  6 - 23 mg/dL   Creatinine, Ser 1.20 (*) 0.50 - 1.10 mg/dL   Calcium 9.0  8.4 - 10.5 mg/dL   Total Protein 7.5  6.0 - 8.3 g/dL   Albumin 3.7  3.5 - 5.2 g/dL   AST 15  0 - 37 U/L   ALT 7  0 - 35 U/L   Alkaline Phosphatase 64  39 - 117 U/L   Total Bilirubin 0.3  0.3 - 1.2 mg/dL   GFR calc non Af Amer 43 (*) >90 mL/min   GFR calc Af Amer 50 (*) >90 mL/min   Comment: (NOTE)     The eGFR has been calculated using the CKD EPI equation.     This calculation has not been validated in all clinical situations.     eGFR's persistently <90 mL/min signify possible Chronic Kidney     Disease.  LIPASE, BLOOD     Status: Abnormal   Collection Time    01/10/14  5:05 PM      Result Value Ref Range   Lipase 78 (*) 11 - 59 U/L  I-STAT CG4 LACTIC ACID, ED     Status: None   Collection Time    01/10/14  8:46 PM      Result Value Ref Range   Lactic Acid, Venous 0.58  0.5  - 2.2 mmol/L    Ct Chest W Contrast  01/10/2014   CLINICAL DATA:  Persistent weight loss. Mid to lower abdominal pain. Diarrhea. Abdominal distention.  EXAM: CT CHEST, ABDOMEN, AND PELVIS WITH CONTRAST  TECHNIQUE: Multidetector CT imaging of the chest, abdomen and pelvis was performed following the standard protocol during bolus administration of intravenous contrast.  CONTRAST:  100 mL of Omnipaque 300 intravenous contrast.  COMPARISON:  CT, 05/15/2013.  FINDINGS: CT CHEST FINDINGS  No neck base or axillary masses or pathologically enlarged lymph nodes.  Heart is normal in size. Minimal coronary artery calcifications. Great vessels are normal in caliber. No mediastinal or hilar masses or adenopathy.  Mild lung base subsegmental atelectasis and/ scarring, greater on the left. 4 mm nodule in the lateral left lower lobe, image 31, series 3. There are few other tiny nodules bilaterally. No lung consolidation or edema. No pleural effusion or pneumothorax.  CT ABDOMEN AND PELVIS FINDINGS  Moderate amount of free intraperitoneal air primarily collecting anteriorly. Most of the small bowel is dilated. Maximum diameter of the small bowel is approximately 5.8 cm. There is decompressed distal ileum and a possible transition point in the central abdomen. There is an abnormal position of the ligament of Treitz, with the third portion and fourth portion of the duodenum extending around the superior mesenteric vessels to lie in the right mid abdomen. This change in position may be postsurgical. This apparent small bowel obstruction may be on the basis of adhesions. No bowel mass is seen. There is no wall thickening. No pneumatosis intestinalis is visualized.  The colon is mostly decompressed. There are diverticula along the sigmoid colon. No diverticulitis. A normal appendix is visualized.  Liver  is normal in size. No liver mass or focal lesion. Normal spleen. Gallbladder surgically absent. No bile duct dilation. Normal  pancreas. No adrenal masses. Stable 6.2 cm left renal cyst. Mild renal cortical thinning. No other renal abnormalities. No hydronephrosis. Ureters are not well visualized. Bladder is mostly decompressed but otherwise unremarkable.  Uterus is surgically absent.  No pelvic masses.  No pathologically enlarged lymph nodes.  Small to moderate amount of ascites.  Bony structures are demineralized. There are degenerative changes throughout the visualized spine. No osteoblastic or osteolytic lesions.  IMPRESSION: 1. Moderate amount of free intraperitoneal air suggesting a perforated viscus. This is similar to the appearance on the prior CT. 2. Small bowel obstruction with an apparent transition point in the right central abdomen suggesting obstruction based on adhesions. No pneumatosis intestinalis. No bowel wall thickening to suggest ischemia. Distal ileum is decompressed. Colon is mostly decompressed. 3. Small to moderate amount of ascites. 4. Few small pulmonary nodules, the largest a 4 mm nodule in the lateral left lower lobe, which is stable from the prior exam strongly suggesting a benign etiology. Mild lung base subsegmental atelectasis and/or scarring. 5. No CT evidence of malignancy. Patient's referring 64 office was contacted with this report at the time of this dictation.   Electronically Signed   By: Lajean Manes M.D.   On: 01/10/2014 15:08   Ct Abdomen Pelvis W Contrast  01/10/2014   CLINICAL DATA:  Persistent weight loss. Mid to lower abdominal pain. Diarrhea. Abdominal distention.  EXAM: CT CHEST, ABDOMEN, AND PELVIS WITH CONTRAST  TECHNIQUE: Multidetector CT imaging of the chest, abdomen and pelvis was performed following the standard protocol during bolus administration of intravenous contrast.  CONTRAST:  100 mL of Omnipaque 300 intravenous contrast.  COMPARISON:  CT, 05/15/2013.  FINDINGS: CT CHEST FINDINGS  No neck base or axillary masses or pathologically enlarged lymph nodes.  Heart is  normal in size. Minimal coronary artery calcifications. Great vessels are normal in caliber. No mediastinal or hilar masses or adenopathy.  Mild lung base subsegmental atelectasis and/ scarring, greater on the left. 4 mm nodule in the lateral left lower lobe, image 31, series 3. There are few other tiny nodules bilaterally. No lung consolidation or edema. No pleural effusion or pneumothorax.  CT ABDOMEN AND PELVIS FINDINGS  Moderate amount of free intraperitoneal air primarily collecting anteriorly. Most of the small bowel is dilated. Maximum diameter of the small bowel is approximately 5.8 cm. There is decompressed distal ileum and a possible transition point in the central abdomen. There is an abnormal position of the ligament of Treitz, with the third portion and fourth portion of the duodenum extending around the superior mesenteric vessels to lie in the right mid abdomen. This change in position may be postsurgical. This apparent small bowel obstruction may be on the basis of adhesions. No bowel mass is seen. There is no wall thickening. No pneumatosis intestinalis is visualized.  The colon is mostly decompressed. There are diverticula along the sigmoid colon. No diverticulitis. A normal appendix is visualized.  Liver is normal in size. No liver mass or focal lesion. Normal spleen. Gallbladder surgically absent. No bile duct dilation. Normal pancreas. No adrenal masses. Stable 6.2 cm left renal cyst. Mild renal cortical thinning. No other renal abnormalities. No hydronephrosis. Ureters are not well visualized. Bladder is mostly decompressed but otherwise unremarkable.  Uterus is surgically absent.  No pelvic masses.  No pathologically enlarged lymph nodes.  Small to moderate amount of ascites.  Bony  structures are demineralized. There are degenerative changes throughout the visualized spine. No osteoblastic or osteolytic lesions.  IMPRESSION: 1. Moderate amount of free intraperitoneal air suggesting a  perforated viscus. This is similar to the appearance on the prior CT. 2. Small bowel obstruction with an apparent transition point in the right central abdomen suggesting obstruction based on adhesions. No pneumatosis intestinalis. No bowel wall thickening to suggest ischemia. Distal ileum is decompressed. Colon is mostly decompressed. 3. Small to moderate amount of ascites. 4. Few small pulmonary nodules, the largest a 4 mm nodule in the lateral left lower lobe, which is stable from the prior exam strongly suggesting a benign etiology. Mild lung base subsegmental atelectasis and/or scarring. 5. No CT evidence of malignancy. Patient's referring 56 office was contacted with this report at the time of this dictation.   Electronically Signed   By: Lajean Manes M.D.   On: 01/10/2014 15:08    Review of Systems  Constitutional: Positive for weight loss and malaise/fatigue.  HENT: Negative.   Eyes: Negative.   Respiratory: Negative.   Cardiovascular: Negative.   Gastrointestinal: Positive for diarrhea.  Genitourinary: Negative.   Musculoskeletal: Negative.   Skin: Negative.   Neurological: Negative.   Psychiatric/Behavioral: Negative.    Blood pressure 110/64, pulse 82, temperature 98.3 F (36.8 C), temperature source Oral, resp. rate 20, SpO2 100.00%. Physical Exam  Constitutional: She is oriented to person, place, and time. She appears well-developed and well-nourished.  HENT:  Head: Normocephalic and atraumatic.  Eyes: Pupils are equal, round, and reactive to light. No scleral icterus.  Neck: Normal range of motion.  Cardiovascular: An irregularly irregular rhythm present.  GI: Soft. She exhibits distension. She exhibits no mass. There is no tenderness. There is no rebound and no guarding.  Musculoskeletal: Normal range of motion.  Lymphadenopathy:    She has no cervical adenopathy.  Neurological: She is alert and oriented to person, place, and time.  Skin: Skin is warm and dry.   Psychiatric: She has a normal mood and affect. Her behavior is normal. Judgment and thought content normal.    Assessment/Plan: Weight loss Chronic diarrhea Hx of free intraperitoneal air with current finding on CT without peritonitis EGD normal 04/2013 Irregular heartbeat currently ? A fib No emergent need for surgery.  Can't find Dr Collene Mares colonoscopy report at this time Would benefit from laparoscopy at some point Need to get heart issues / medical issues evaluated before any intervention done Recommend medical admission for heart issues  This is a perplexing problem.  CT done for weight loss and exam last week is similar to tonight.  Keep NPO for tonight  Would not cover with ABX Will obtain recent colonoscopy report Follow up in am Marqui Formby A. 01/10/2014, 11:04 PM

## 2014-01-11 ENCOUNTER — Inpatient Hospital Stay (HOSPITAL_COMMUNITY): Payer: Medicare Other

## 2014-01-11 DIAGNOSIS — N183 Chronic kidney disease, stage 3 unspecified: Secondary | ICD-10-CM | POA: Diagnosis present

## 2014-01-11 DIAGNOSIS — I369 Nonrheumatic tricuspid valve disorder, unspecified: Secondary | ICD-10-CM

## 2014-01-11 DIAGNOSIS — E860 Dehydration: Secondary | ICD-10-CM

## 2014-01-11 DIAGNOSIS — I4891 Unspecified atrial fibrillation: Secondary | ICD-10-CM | POA: Diagnosis present

## 2014-01-11 DIAGNOSIS — E876 Hypokalemia: Secondary | ICD-10-CM | POA: Diagnosis present

## 2014-01-11 LAB — COMPREHENSIVE METABOLIC PANEL
ALK PHOS: 52 U/L (ref 39–117)
ALT: 5 U/L (ref 0–35)
ALT: 7 U/L (ref 0–35)
AST: 13 U/L (ref 0–37)
AST: 14 U/L (ref 0–37)
Albumin: 3.2 g/dL — ABNORMAL LOW (ref 3.5–5.2)
Albumin: 3.5 g/dL (ref 3.5–5.2)
Alkaline Phosphatase: 55 U/L (ref 39–117)
BILIRUBIN TOTAL: 0.3 mg/dL (ref 0.3–1.2)
BILIRUBIN TOTAL: 0.4 mg/dL (ref 0.3–1.2)
BUN: 17 mg/dL (ref 6–23)
BUN: 15 mg/dL (ref 6–23)
CALCIUM: 9 mg/dL (ref 8.4–10.5)
CHLORIDE: 106 meq/L (ref 96–112)
CHLORIDE: 109 meq/L (ref 96–112)
CO2: 16 mEq/L — ABNORMAL LOW (ref 19–32)
CO2: 16 meq/L — AB (ref 19–32)
Calcium: 8.5 mg/dL (ref 8.4–10.5)
Creatinine, Ser: 1.16 mg/dL — ABNORMAL HIGH (ref 0.50–1.10)
Creatinine, Ser: 1.18 mg/dL — ABNORMAL HIGH (ref 0.50–1.10)
GFR calc Af Amer: 52 mL/min — ABNORMAL LOW (ref >90)
GFR calc Af Amer: 51 mL/min — ABNORMAL LOW (ref >90)
GFR, EST NON AFRICAN AMERICAN: 45 mL/min — AB (ref >90)
GFR, EST NON AFRICAN AMERICAN: 44 mL/min — AB (ref >90)
Glucose, Bld: 78 mg/dL (ref 70–99)
Glucose, Bld: 60 mg/dL — ABNORMAL LOW (ref 70–99)
Potassium: 2.7 mEq/L — CL (ref 3.7–5.3)
Potassium: 3.8 mEq/L (ref 3.7–5.3)
Sodium: 140 mEq/L (ref 137–147)
Sodium: 144 mEq/L (ref 137–147)
TOTAL PROTEIN: 6.4 g/dL (ref 6.0–8.3)
Total Protein: 6.8 g/dL (ref 6.0–8.3)

## 2014-01-11 LAB — CBC
HCT: 33.3 % — ABNORMAL LOW (ref 36.0–46.0)
Hemoglobin: 10.9 g/dL — ABNORMAL LOW (ref 12.0–15.0)
MCH: 27.1 pg (ref 26.0–34.0)
MCHC: 32.7 g/dL (ref 30.0–36.0)
MCV: 82.8 fL (ref 78.0–100.0)
PLATELETS: 201 10*3/uL (ref 150–400)
RBC: 4.02 MIL/uL (ref 3.87–5.11)
RDW: 14.8 % (ref 11.5–15.5)
WBC: 6.3 10*3/uL (ref 4.0–10.5)

## 2014-01-11 LAB — URINALYSIS, ROUTINE W REFLEX MICROSCOPIC
Bilirubin Urine: NEGATIVE
Glucose, UA: NEGATIVE mg/dL
HGB URINE DIPSTICK: NEGATIVE
Ketones, ur: NEGATIVE mg/dL
Leukocytes, UA: NEGATIVE
Nitrite: NEGATIVE
PH: 5 (ref 5.0–8.0)
Protein, ur: NEGATIVE mg/dL
SPECIFIC GRAVITY, URINE: 1.034 — AB (ref 1.005–1.030)
UROBILINOGEN UA: 0.2 mg/dL (ref 0.0–1.0)

## 2014-01-11 LAB — CBC WITH DIFFERENTIAL/PLATELET
BASOS PCT: 0 % (ref 0–1)
Basophils Absolute: 0 10*3/uL (ref 0.0–0.1)
EOS PCT: 2 % (ref 0–5)
Eosinophils Absolute: 0.1 10*3/uL (ref 0.0–0.7)
HCT: 31.9 % — ABNORMAL LOW (ref 36.0–46.0)
Hemoglobin: 10.4 g/dL — ABNORMAL LOW (ref 12.0–15.0)
Lymphocytes Relative: 41 % (ref 12–46)
Lymphs Abs: 2.3 10*3/uL (ref 0.7–4.0)
MCH: 26.9 pg (ref 26.0–34.0)
MCHC: 32.6 g/dL (ref 30.0–36.0)
MCV: 82.6 fL (ref 78.0–100.0)
Monocytes Absolute: 0.3 10*3/uL (ref 0.1–1.0)
Monocytes Relative: 5 % (ref 3–12)
NEUTROS PCT: 52 % (ref 43–77)
Neutro Abs: 2.8 10*3/uL (ref 1.7–7.7)
PLATELETS: 195 10*3/uL (ref 150–400)
RBC: 3.86 MIL/uL — ABNORMAL LOW (ref 3.87–5.11)
RDW: 14.7 % (ref 11.5–15.5)
WBC: 5.5 10*3/uL (ref 4.0–10.5)

## 2014-01-11 LAB — MAGNESIUM
MAGNESIUM: 0.8 mg/dL — AB (ref 1.5–2.5)
MAGNESIUM: 1.5 mg/dL (ref 1.5–2.5)

## 2014-01-11 LAB — TSH: TSH: 3.37 u[IU]/mL (ref 0.350–4.500)

## 2014-01-11 LAB — PROTIME-INR
INR: 1.15 (ref 0.00–1.49)
PROTHROMBIN TIME: 14.5 s (ref 11.6–15.2)

## 2014-01-11 LAB — APTT: APTT: 32 s (ref 24–37)

## 2014-01-11 LAB — GLUCOSE, CAPILLARY: Glucose-Capillary: 71 mg/dL (ref 70–99)

## 2014-01-11 LAB — PHOSPHORUS: Phosphorus: 3.2 mg/dL (ref 2.3–4.6)

## 2014-01-11 MED ORDER — MAGNESIUM SULFATE 40 MG/ML IJ SOLN
2.0000 g | Freq: Once | INTRAMUSCULAR | Status: DC
  Filled 2014-01-11: qty 50

## 2014-01-11 MED ORDER — POTASSIUM CHLORIDE 10 MEQ/100ML IV SOLN: 10.0000 meq | INTRAVENOUS | Status: DC

## 2014-01-11 MED ORDER — SODIUM CHLORIDE 0.9 % IV SOLN
INTRAVENOUS | Status: AC
  Administered 2014-01-11: 01:00:00 via INTRAVENOUS

## 2014-01-11 MED ORDER — IOHEXOL 300 MG/ML  SOLN
600.0000 mL | Freq: Once | INTRAMUSCULAR | Status: AC | PRN
  Administered 2014-01-11: 600 mL via ORAL

## 2014-01-11 MED ORDER — ONDANSETRON HCL 4 MG/2ML IJ SOLN: 4.0000 mg | Freq: Four times a day (QID) | INTRAMUSCULAR | Status: DC | PRN

## 2014-01-11 MED ORDER — MAGNESIUM SULFATE 40 MG/ML IJ SOLN
2.0000 g | Freq: Once | INTRAMUSCULAR | Status: AC
  Administered 2014-01-11: 2 g via INTRAVENOUS
  Filled 2014-01-11: qty 50

## 2014-01-11 MED ORDER — ACETAMINOPHEN 325 MG PO TABS: 650.0000 mg | ORAL_TABLET | Freq: Four times a day (QID) | ORAL | Status: DC | PRN

## 2014-01-11 MED ORDER — ACETAMINOPHEN 650 MG RE SUPP: 650.0000 mg | Freq: Four times a day (QID) | RECTAL | Status: DC | PRN

## 2014-01-11 MED ORDER — MAGNESIUM OXIDE 400 (241.3 MG) MG PO TABS
400.0000 mg | ORAL_TABLET | Freq: Two times a day (BID) | ORAL | Status: AC
  Administered 2014-01-11 – 2014-01-12 (×2): 400 mg via ORAL
  Filled 2014-01-11 (×3): qty 1

## 2014-01-11 MED ORDER — POTASSIUM CHLORIDE CRYS ER 20 MEQ PO TBCR: 40.0000 meq | EXTENDED_RELEASE_TABLET | ORAL | Status: DC

## 2014-01-11 MED ORDER — POTASSIUM CHLORIDE 10 MEQ/100ML IV SOLN
10.0000 meq | INTRAVENOUS | Status: AC
  Administered 2014-01-11 (×5): 10 meq via INTRAVENOUS
  Filled 2014-01-11 (×7): qty 100

## 2014-01-11 MED ORDER — POTASSIUM CHLORIDE CRYS ER 20 MEQ PO TBCR
40.0000 meq | EXTENDED_RELEASE_TABLET | Freq: Once | ORAL | Status: AC
  Administered 2014-01-11: 40 meq via ORAL
  Filled 2014-01-11: qty 2

## 2014-01-11 MED ORDER — SODIUM CHLORIDE 0.9 % IJ SOLN
3.0000 mL | Freq: Two times a day (BID) | INTRAMUSCULAR | Status: DC
  Administered 2014-01-12: 3 mL via INTRAVENOUS

## 2014-01-11 MED ORDER — POTASSIUM CHLORIDE 10 MEQ/100ML IV SOLN
10.0000 meq | Freq: Once | INTRAVENOUS | Status: AC
  Administered 2014-01-11: 10 meq via INTRAVENOUS

## 2014-01-11 MED ORDER — ENOXAPARIN SODIUM 40 MG/0.4ML ~~LOC~~ SOLN
40.0000 mg | SUBCUTANEOUS | Status: DC
  Filled 2014-01-11 (×2): qty 0.4

## 2014-01-11 MED ORDER — ONDANSETRON HCL 4 MG PO TABS: 4.0000 mg | ORAL_TABLET | Freq: Four times a day (QID) | ORAL | Status: DC | PRN

## 2014-01-11 NOTE — Progress Notes (Signed)
  Echocardiogram 2D Echocardiogram has been performed.  Arvil ChacoFoster, Rachel 01/11/2014, 4:52 PM

## 2014-01-11 NOTE — Progress Notes (Signed)
Reviewed dietetic intern note. Agree with estimated nutrition needs. If expected pt will remain NPO > 5 days, recommend initiating TPN. RD will continue to monitor. Ian Malkineanne Barnett RD, LDN Inpatient Clinical Dietitian Pager: [phone] After Hours Pager: [phone]

## 2014-01-11 NOTE — H&P (Signed)
[facility] History and Physical  Alicia ReapMary Derr-Penn ZOX:[id number] DOB: [birth year] DOA: 01/10/2014  Referring physician: ER physician PCP: Bufford SpikesEED, TIFFANY, DO   Chief Complaint: abdominal pain  HPI:  78 year old female with past medical history of gastritis, GERD, chronic kidney disease stage III, history of hysterectomy and laparoscopic cholecystectomy who [facility] ED 01/10/2014 with complaints of weight loss, persistent diarrhea for a couple of months now, abdominal discomfort and distention. Patient reports poor by mouth intake but no reports of vomiting. No reports of blood in the stool. No fevers or chills. No chest pain or shortness of breath. In ED vital signs are stable. Blood work revealed hemoglobin of 11.3, potassium of 3.2 and creatinine of 1.2. CT abdomen showed moderate amount of free intraperitoneal air suggesting perforated viscus similar to the appearance on prior CT scans, small bowel obstruction likely due to adhesions. There were small pulmonary nodules in the left lower lobe of the lungs stable from prior studies suggesting a benign etiology. In ER, her monitor showed atrial fibrillation, heart rate normal. As mentioned previously, no complaints of chest pain.   A monitor ssessment & Plan    Principal Problem:   SBO (small bowel obstruction)  Appreciate surgery consult and recommendations. For now conservative management with IV fluids, bowel rest but patient may benefit from laparoscopy by not at this time and cardiac issues should resolve prior to any surgery planning   Continue antiemetics as needed. Patient has no significant pain at this time.  Active Problems:   Atrial fibrillation   Heart rate within the normal limit but irregular. Obtain 12-lead EKG now and then in the morning.   CKD (chronic kidney disease), stage III  Creatinine 1.2 on this admission. Followup BMP in the morning   GERD (gastroesophageal reflux disease)   Will start Protonix IV  daily   DVT prophylaxis: SCD's bilaterally   Radiological Exams on Admission: Ct Chest W Contrast  01/10/2014   CLINICAL DATA:  Persistent weight loss. Mid to lower abdominal pain. Diarrhea. Abdominal distention.  EXAM: CT CHEST, ABDOMEN, AND PELVIS WITH CONTRAST  TECHNIQUE: Multidetector CT imaging of the chest, abdomen and pelvis was performed following the standard protocol during bolus administration of intravenous contrast.  CONTRAST:  100 mL of Omnipaque 300 intravenous contrast.  COMPARISON:  CT, 05/15/2013.  FINDINGS: CT CHEST FINDINGS  No neck base or axillary masses or pathologically enlarged lymph nodes.  Heart is normal in size. Minimal coronary artery calcifications. Great vessels are normal in caliber. No mediastinal or hilar masses or adenopathy.  Mild lung base subsegmental atelectasis and/ scarring, greater on the left. 4 mm nodule in the lateral left lower lobe, image 31, series 3. There are few other tiny nodules bilaterally. No lung consolidation or edema. No pleural effusion or pneumothorax.  CT ABDOMEN AND PELVIS FINDINGS  Moderate amount of free intraperitoneal air primarily collecting anteriorly. Most of the small bowel is dilated. Maximum diameter of the small bowel is approximately 5.8 cm. There is decompressed distal ileum and a possible transition point in the central abdomen. There is an abnormal position of the ligament of Treitz, with the third portion and fourth portion of the duodenum extending around the superior mesenteric vessels to lie in the right mid abdomen. This change in position may be postsurgical. This apparent small bowel obstruction may be on the basis of adhesions. No bowel mass is seen. There is no wall thickening. No pneumatosis intestinalis is visualized.  The colon is mostly decompressed. There are  diverticula along the sigmoid colon. No diverticulitis. A normal appendix is visualized.  Liver is normal in size. No liver mass or focal lesion. Normal spleen.  Gallbladder surgically absent. No bile duct dilation. Normal pancreas. No adrenal masses. Stable 6.2 cm left renal cyst. Mild renal cortical thinning. No other renal abnormalities. No hydronephrosis. Ureters are not well visualized. Bladder is mostly decompressed but otherwise unremarkable.  Uterus is surgically absent.  No pelvic masses.  No pathologically enlarged lymph nodes.  Small to moderate amount of ascites.  Bony structures are demineralized. There are degenerative changes throughout the visualized spine. No osteoblastic or osteolytic lesions.  IMPRESSION: 1. Moderate amount of free intraperitoneal air suggesting a perforated viscus. This is similar to the appearance on the prior CT. 2. Small bowel obstruction with an apparent transition point in the right central abdomen suggesting obstruction based on adhesions. No pneumatosis intestinalis. No bowel wall thickening to suggest ischemia. Distal ileum is decompressed. Colon is mostly decompressed. 3. Small to moderate amount of ascites. 4. Few small pulmonary nodules, the largest a 4 mm nodule in the lateral left lower lobe, which is stable from the prior exam strongly suggesting a benign etiology. Mild lung base subsegmental atelectasis and/or scarring. 5. No CT evidence of malignancy. Patient's referring physician's office was contacted with this report at the time of this dictation.   Electronically Signed   By: Amie Portlandavid  Ormond M.D.   On: 01/10/2014 15:08   Ct Abdomen Pelvis W Contrast  01/10/2014   CLINICAL DATA:  Persistent weight loss. Mid to lower abdominal pain. Diarrhea. Abdominal distention.  EXAM: CT CHEST, ABDOMEN, AND PELVIS WITH CONTRAST  TECHNIQUE: Multidetector CT imaging of the chest, abdomen and pelvis was performed following the standard protocol during bolus administration of intravenous contrast.  CONTRAST:  100 mL of Omnipaque 300 intravenous contrast.  COMPARISON:  CT, 05/15/2013.  FINDINGS: CT CHEST FINDINGS  No neck base or axillary  masses or pathologically enlarged lymph nodes.  Heart is normal in size. Minimal coronary artery calcifications. Great vessels are normal in caliber. No mediastinal or hilar masses or adenopathy.  Mild lung base subsegmental atelectasis and/ scarring, greater on the left. 4 mm nodule in the lateral left lower lobe, image 31, series 3. There are few other tiny nodules bilaterally. No lung consolidation or edema. No pleural effusion or pneumothorax.  CT ABDOMEN AND PELVIS FINDINGS  Moderate amount of free intraperitoneal air primarily collecting anteriorly. Most of the small bowel is dilated. Maximum diameter of the small bowel is approximately 5.8 cm. There is decompressed distal ileum and a possible transition point in the central abdomen. There is an abnormal position of the ligament of Treitz, with the third portion and fourth portion of the duodenum extending around the superior mesenteric vessels to lie in the right mid abdomen. This change in position may be postsurgical. This apparent small bowel obstruction may be on the basis of adhesions. No bowel mass is seen. There is no wall thickening. No pneumatosis intestinalis is visualized.  The colon is mostly decompressed. There are diverticula along the sigmoid colon. No diverticulitis. A normal appendix is visualized.  Liver is normal in size. No liver mass or focal lesion. Normal spleen. Gallbladder surgically absent. No bile duct dilation. Normal pancreas. No adrenal masses. Stable 6.2 cm left renal cyst. Mild renal cortical thinning. No other renal abnormalities. No hydronephrosis. Ureters are not well visualized. Bladder is mostly decompressed but otherwise unremarkable.  Uterus is surgically absent.  No pelvic masses.  No pathologically enlarged lymph nodes.  Small to moderate amount of ascites.  Bony structures are demineralized. There are degenerative changes throughout the visualized spine. No osteoblastic or osteolytic lesions.  IMPRESSION: 1. Moderate  amount of free intraperitoneal air suggesting a perforated viscus. This is similar to the appearance on the prior CT. 2. Small bowel obstruction with an apparent transition point in the right central abdomen suggesting obstruction based on adhesions. No pneumatosis intestinalis. No bowel wall thickening to suggest ischemia. Distal ileum is decompressed. Colon is mostly decompressed. 3. Small to moderate amount of ascites. 4. Few small pulmonary nodules, the largest a 4 mm nodule in the lateral left lower lobe, which is stable from the prior exam strongly suggesting a benign etiology. Mild lung base subsegmental atelectasis and/or scarring. 5. No CT evidence of malignancy. Patient's referring physician's office was contacted with this report at the time of this dictation.   Electronically Signed   By: Amie Portland M.D.   On: 01/10/2014 15:08    Code Status: Full Family Communication: Plan of care discussed with the patient  Disposition Plan: Admit for further evaluation  Manson Passey, MD  [facility] Pager [phone]  Review of Systems:  Constitutional: Negative for fever, chills and malaise/fatigue. Negative for diaphoresis.  HENT: Negative for hearing loss, ear pain, nosebleeds, congestion, sore throat, neck pain, tinnitus and ear discharge.   Eyes: Negative for blurred vision, double vision, photophobia, pain, discharge and redness.  Respiratory: Negative for cough, hemoptysis, sputum production, shortness of breath, wheezing and stridor.   Cardiovascular: Negative for chest pain, palpitations, orthopnea, claudication and leg swelling.  Gastrointestinal: per HPI  Genitourinary: Negative for dysuria, urgency, frequency, hematuria and flank pain.  Musculoskeletal: Negative for myalgias, back pain, joint pain and falls.  Skin: Negative for itching and rash.  Neurological: Negative for dizziness and weakness. Negative for tingling, tremors, sensory change, speech change, focal weakness, loss  of consciousness and headaches.  Endo/Heme/Allergies: Negative for environmental allergies and polydipsia. Does not bruise/bleed easily.  Psychiatric/Behavioral: Negative for suicidal ideas. The patient is not nervous/anxious.      Past Medical History  Diagnosis Date  . High cholesterol   . Sleep apnea     HX OF APNEA LOST WEIGHT AND NO LONGER   . H/O hiatal hernia   . GERD (gastroesophageal reflux disease)   . Pernicious anemia    Past Surgical History  Procedure Laterality Date  . Abdominal hysterectomy  1984    Dr. Gaynell Face  . Cholecystectomy  [address]    [place], [place]  . Cesarean section  1959    [place]  . Esophagogastroduodenoscopy Left 05/17/2013    Procedure: ESOPHAGOGASTRODUODENOSCOPY (EGD);  Surgeon: Willis Modena, MD;  Location: [facility] ENDOSCOPY;  Service: Endoscopy;  Laterality: Left;   Social History:  reports that she quit smoking about 31 years ago. Her smoking use included Cigarettes. She smoked 0.00 packs per day. She has quit using smokeless tobacco. She reports that she drinks alcohol. She reports that she uses illicit drugs (Marijuana).  No Known Allergies  Family History:  Family History  Problem Relation Age of Onset  . Cancer Mother   . Cancer Father   . Cancer Sister     breast  . Diabetes Sister   . Hypertension Sister   . Cancer Brother     prostate  . Kidney disease Sister   . Heart disease Brother      Prior to Admission medications   Medication Sig Start Date End  Date Taking? Authorizing Provider  lansoprazole (PREVACID) 30 MG capsule Take 30 mg by mouth daily as needed (acid relfux).   Yes Historical Provider, MD  Probiotic Product (PROBIOTIC DAILY PO) Take 1 capsule by mouth daily.   Yes Historical Provider, MD  ranitidine (ZANTAC) 150 MG tablet Take 150 mg by mouth as needed for heartburn.   Yes Historical Provider, MD   Physical Exam: Filed Vitals:   01/10/14 2106 01/10/14 2130 01/10/14 2206 01/10/14 2305  BP: 114/62 101/68 110/64  109/74  Pulse:  82 82 89  Temp:      TempSrc:      Resp: 22 17 20 15   SpO2: 100% 100% 100% 100%    Physical Exam  Constitutional: Appears well-developed and well-nourished. No distress.  HENT: Normocephalic. No tonsillar erythema or exudates Eyes: Conjunctivae and EOM are normal. PERRLA, no scleral icterus.  Neck: Normal ROM. Neck supple. No JVD. No tracheal deviation. No thyromegaly.  CVS: irregular rhythm, rate controlled, S1/S2 +, no murmurs, no gallops, no carotid bruit.  Pulmonary: Effort and breath sounds normal, no stridor, rhonchi, wheezes, rales.  Abdominal: distended, no rebound or guarding.  Musculoskeletal: Normal range of motion. No edema and no tenderness.  Lymphadenopathy: No lymphadenopathy noted, cervical, inguinal. Neuro: Alert. Normal reflexes, muscle tone coordination. No focal neurologic deficits. Skin: Skin is warm and dry. No rash noted. Not diaphoretic. No erythema. No pallor.  Psychiatric: Normal mood and affect. Behavior, judgment, thought content normal.   Labs on Admission:  Basic Metabolic Panel:  Recent Labs Lab 01/10/14 1705  NA 140  K 3.2*  CL 104  CO2 16*  GLUCOSE 89  BUN 20  CREATININE 1.20*  CALCIUM 9.0   Liver Function Tests:  Recent Labs Lab 01/10/14 1705  AST 15  ALT 7  ALKPHOS 64  BILITOT 0.3  PROT 7.5  ALBUMIN 3.7    Recent Labs Lab 01/10/14 1705  LIPASE 78*   No results found for this basename: AMMONIA,  in the last 168 hours CBC:  Recent Labs Lab 01/10/14 1705  WBC 6.2  NEUTROABS 3.0  HGB 11.3*  HCT 34.6*  MCV 83.0  PLT 247   Cardiac Enzymes: No results found for this basename: CKTOTAL, CKMB, CKMBINDEX, TROPONINI,  in the last 168 hours BNP: No components found with this basename: POCBNP,  CBG: No results found for this basename: GLUCAP,  in the last 168 hours  If 7PM-7AM, please contact night-coverage [email or web address] Password [email or web address] 01/11/2014, 12:00 AM

## 2014-01-11 NOTE — Progress Notes (Addendum)
Received critical lab results Potassium--2.7 and Magnesium 0.8. Text paged K.Kirby,NP and received new orders-- Potassium Chloride 10 Meq in IV every 1 hr x 6. Magnesium Sulfate 2g IV once. Also some lab orders CBC,BMP and Magnesium level.

## 2014-01-11 NOTE — Progress Notes (Addendum)
[facility] Progress Note   Alicia Smith ZOX:[id number] DOB: 1935-07-30 DOA: 01/10/2014 PCP: Bufford SpikesEED, TIFFANY, DO  Brief narrative: Alicia Smith is a 78 y.o. female  Who has been losing weight and underwent a CT scan ordered by Dr Renato Gailseed. It was done on 6/18 and showed free air  which was there in the past and a small bowel obstruction. Surgery evaluated the patient and asked medicine to have her admitted for "cardiac reasons"   Subjective: Tells me she feels fine and has been feeling fine- not sure why she was admitted.   Assessment/Plan: Principal Problem:   SBO/ free air in abdomen - small bowel follow through does not reveal any perf - per surgery we can start her on clears today and d/c her if she tolerates a diet - they do not plan on doing a laparoscopy for her free air that is chronic and likely not causing the weight loss  Active Problems: Weight loss and diarrhea - need to complete outpatient work up  Dehydration -having giving IVF- starting clears now  Hypokalemia and hypomagnesemia - due to diarrhea?- hypomagnesemia may be due to chronic PPI use - will aggressively both replace IV and PO    GERD (gastroesophageal reflux disease) - per our med/rec she takes Prevacid and Zantac at home- hopefully prevacid is not causing her diarrhea    CKD (chronic kidney disease), stage III - stable    Atrial fibrillation - EKG reveals NSR with PACs    Code Status: full code Family Communication: none Disposition Plan: home  Consultants: Surgery (signing off)  Procedures: none  Antibiotics: Anti-infectives   None       DVT prophylaxis: Lovenox  Objective: Filed Weights   01/11/14 0036 01/11/14 0527  Weight: 65.227 kg (143 lb 12.8 oz) 65.227 kg (143 lb 12.8 oz)    Vitals Filed Vitals:   01/10/14 2305 01/11/14 0036 01/11/14 0527 01/11/14 1520  BP: 109/74 108/66 87/57 106/70  Pulse: 89 68 85 82  Temp:  98 F (36.7 C) 97.9 F (36.6 C) 97.9 F (36.6  C)  TempSrc:  Oral Oral Oral  Resp: 15 16 17 17   Weight:  65.227 kg (143 lb 12.8 oz) 65.227 kg (143 lb 12.8 oz)   SpO2: 100% 100% 97% 98%      Intake/Output Summary (Last 24 hours) at 01/11/14 1600 Last data filed at 01/11/14 0905  Gross per 24 hour  Intake  272.5 ml  Output    525 ml  Net -252.5 ml     Exam: General: No acute respiratory distress Lungs: Clear to auscultation bilaterally without wheezes or crackles Cardiovascular: Regular rate and rhythm without murmur gallop or rub normal S1 and S2 Abdomen: Nontender, nondistended, soft, bowel sounds positive, no rebound, no ascites, no appreciable mass Extremities: No significant cyanosis, clubbing, or edema bilateral lower extremities  Data Reviewed: Basic Metabolic Panel:  Recent Labs Lab 01/10/14 1705 01/11/14 0245  NA 140 140  K 3.2* 2.7*  CL 104 106  CO2 16* 16*  GLUCOSE 89 78  BUN 20 17  CREATININE 1.20* 1.16*  CALCIUM 9.0 8.5  MG  --  0.8*  PHOS  --  3.2   Liver Function Tests:  Recent Labs Lab 01/10/14 1705 01/11/14 0245  AST 15 13  ALT 7 5  ALKPHOS 64 52  BILITOT 0.3 0.3  PROT 7.5 6.4  ALBUMIN 3.7 3.2*    Recent Labs Lab 01/10/14 1705  LIPASE 78*   No results found for this  basename: AMMONIA,  in the last 168 hours CBC:  Recent Labs Lab 01/10/14 1705 01/11/14 0245  WBC 6.2 5.5  NEUTROABS 3.0 2.8  HGB 11.3* 10.4*  HCT 34.6* 31.9*  MCV 83.0 82.6  PLT 247 195   Cardiac Enzymes: No results found for this basename: CKTOTAL, CKMB, CKMBINDEX, TROPONINI,  in the last 168 hours BNP (last 3 results) No results found for this basename: PROBNP,  in the last 8760 hours CBG:  Recent Labs Lab 01/11/14 0731  GLUCAP 71    No results found for this or any previous visit (from the past 240 hour(s)).   Studies:  Recent x-ray studies have been reviewed in detail by the Attending Physician  Scheduled Meds:  Scheduled Meds: . potassium chloride  10 mEq Intravenous Once  . sodium  chloride  3 mL Intravenous Q12H   Continuous Infusions:   Time spent on care of this patient: 35 min   RIZWAN,SAIMA, MD 01/11/2014, 4:00 PM  LOS: 1 day   [facility] Office  [phone] Pager - Text Page per Loretha StaplerAmion   If 7PM-7AM, please contact night-coverage [email or web address]

## 2014-01-11 NOTE — Progress Notes (Signed)
INITIAL NUTRITION ASSESSMENT  DOCUMENTATION CODES Per approved criteria  -Not Applicable   INTERVENTION:  Diet advancement per MD   RD to continue to follow   NUTRITION DIAGNOSIS: Inadequate oral intake related to inability to eat as evidenced by NPO status.   Goal: Pt to meet >/= 90% of estimated nutrition needs   Monitor:  Diet advancement, weight trend, labs   Reason for Assessment: Positive Malnutrition Screening Tool Score   78 y.o. female  Admitting Dx: SBO (small bowel obstruction)  ASSESSMENT: 78 year old female with past medical history of gastritis, GERD, chronic kidney disease stage III, history of hysterectomy and laparoscopic cholecystectomy who [facility] with com plaints of weight loss, persistent diarrhea for a couple of months now, abdominal discomfort and distention. Patient reports poor by mouth intake but no reports of vomiting. No reports of blood in the stool. No fevers or chills. No chest pain or shortness of breath.  Dietetic intern attempted assessment x 2 and pt was not in room.  Pt is at increased nutrition risk due to weight loss revealed in chart. Chart review reveals a weight loss of 14.8% x 1.5 months, which is severe for time frame.  Unable to assess pt's current nutrition intake. H&P reveals pt reported poor by mouth intake but not specific on how long this has been going on.  Unable to perform nutrition focused physical exam. RD to assess with follow up.   Low Potassium: 2.7 --> being repleted with KCL Low Magnesium: 0.8 --> being repleted with Magnesium Sulfate  Glucose, Phosphorus WNL  Height: Ht Readings from Last 1 Encounters:  12/03/13 5\' 5"  (1.651 m)    Weight: Wt Readings from Last 1 Encounters:  01/11/14 143 lb 12.8 oz (65.227 kg)    Ideal Body Weight: 56.8 kg   % Ideal Body Weight: 115%   Wt Readings from Last 10 Encounters:  01/11/14 143 lb 12.8 oz (65.227 kg)  01/03/14 144 lb (65.318 kg)  12/03/13  167 lb (75.751 kg)  10/04/13 157 lb (71.215 kg)  06/20/13 162 lb (73.483 kg)  05/15/13 170 lb (77.111 kg)  05/15/13 170 lb (77.111 kg)  04/23/13 163 lb (73.936 kg)  03/23/13 169 lb 8 oz (76.885 kg)    Usual Body Weight: unable to determine   % Usual Body Weight: -  BMI:  24 kg/(m^2) , Normal   Estimated Nutritional Needs: Kcal: 1650 - 1850  Protein: 65-75 grams  Fluid: >/= 1.6 L/day   Skin: intact   Diet Order: NPO  EDUCATION NEEDS: -No education needs identified at this time   Intake/Output Summary (Last 24 hours) at 01/11/14 1404 Last data filed at 01/11/14 0905  Gross per 24 hour  Intake  272.5 ml  Output    525 ml  Net -252.5 ml    Last BM: 6/19    Labs:   Recent Labs Lab 01/10/14 1705 01/11/14 0245  NA 140 140  K 3.2* 2.7*  CL 104 106  CO2 16* 16*  BUN 20 17  CREATININE 1.20* 1.16*  CALCIUM 9.0 8.5  MG  --  0.8*  PHOS  --  3.2  GLUCOSE 89 78    CBG (last 3)   Recent Labs  01/11/14 0731  GLUCAP 71    Scheduled Meds: . sodium chloride  3 mL Intravenous Q12H    Continuous Infusions:   Past Medical History  Diagnosis Date  . High cholesterol   . Sleep apnea     HX  OF APNEA LOST WEIGHT AND NO LONGER   . H/O hiatal hernia   . GERD (gastroesophageal reflux disease)   . Pernicious anemia     Past Surgical History  Procedure Laterality Date  . Abdominal hysterectomy  1984    Dr. Gaynell FaceMarshall  . [place]  [address]    [place]  . [place] section  1959    [place]  . Esophagogastroduodenoscopy Left 05/17/2013    Procedure: ESOPHAGOGASTRODUODENOSCOPY (EGD);  Surgeon: Willis ModenaWilliam Outlaw, MD;  [facility];  Service: Endoscopy;  Laterality: Left;    Eppie Gibsonebekah L Matznick, BS Dietetic Intern Pager: [phone]

## 2014-01-11 NOTE — Progress Notes (Signed)
I saw the patient, participated in the history, exam and medical decision making, and concur with most of the physician assistant's note above.  Patient sitting on the couch in the room. Reading a book. Alert, no apparent distress, not ill-appearing, pleasant, Clear to auscultation Soft, nontender, nondistended. No rebound, guarding, peritonitis  Free intra-abdominal air of unknown etiology Unexplained weight loss Loose stools Electrolytes derangements  I have reviewed the patient's hospital records from her hospitalization in October as well as her imaging and results from this hospitalization. Along with her upper endoscopy.   She is completely nontoxic appearing. Her exam and clinical findings are not consistent with free intra-abdominal air. I do not feel diagnostic laparoscopy would be of any benefit at this time since her main complaints are unexplained 100 pound weight loss as well as persistent loose stools. Given the upper GI with small bowel follow-through today which demonstrated no extravasation of contrast along with her essentially normal exam I do not believe she has a perforated viscus.  Patient is not interested in surgery.  I recommend working her up for a malabsorption issue. Consider stool studies, perhaps fat soluble vitamin deficiency. Make sure her routine health maintenance is up-to-date (mammograms) Start clear liquid diet this afternoon and see how she does We will follow and see her in the morning  Lajean SellaEric M. Andrey CampanileWilson, MD, FACS General, Bariatric, & Minimally Invasive Surgery [facility] [place]

## 2014-01-11 NOTE — Progress Notes (Signed)
[facility] Progress Note     Subjective: Pt feels great, a little pain in the epigastrium and RUQ with touch only.  No N/V, hungry.  She continues to have lots of diarrhea from the CT contrast and her abdomen is no longer bloated from that.  She would prefer to avoid surgery if possible, but knows it may be required at some point.  No fever/chills, no WBC count.  Mg and K being supplemented and she's feeling better after that.  Objective: Vital signs in last 24 hours: Temp:  [97.9 F (36.6 C)-98.3 F (36.8 C)] 97.9 F (36.6 C) (06/19 0527) Pulse Rate:  [68-110] 85 (06/19 0527) Resp:  [15-22] 17 (06/19 0527) BP: (87-125)/(57-84) 87/57 mmHg (06/19 0527) SpO2:  [97 %-100 %] 97 % (06/19 0527) Weight:  [143 lb 12.8 oz (65.227 kg)] 143 lb 12.8 oz (65.227 kg) (06/19 0527) Last BM Date: 01/11/14  Intake/Output from previous day: 06/18 0701 - 06/19 0700 In: 272.5 [I.V.:222.5; IV Piggyback:50] Out: 400 [Urine:400] Intake/Output this shift:    PE: Gen:  Alert, NAD, pleasant Card:  IR rhythm, rate controlled currently, no M/G/R heard Pulm:  CTA, no W/R/R Abd: Soft, ND, mild tenderness in the upper abdomen and RUQ, +BS, no HSM   Lab Results:   Recent Labs  01/10/14 1705 01/11/14 0245  WBC 6.2 5.5  HGB 11.3* 10.4*  HCT 34.6* 31.9*  PLT 247 195   BMET  Recent Labs  01/10/14 1705 01/11/14 0245  NA 140 140  K 3.2* 2.7*  CL 104 106  CO2 16* 16*  GLUCOSE 89 78  BUN 20 17  CREATININE 1.20* 1.16*  CALCIUM 9.0 8.5   PT/INR  Recent Labs  01/11/14 0245  LABPROT 14.5  INR 1.15   CMP     Component Value Date/Time   NA 140 01/11/2014 0245   NA 141 01/03/2014 1405   K 2.7* 01/11/2014 0245   CL 106 01/11/2014 0245   CO2 16* 01/11/2014 0245   GLUCOSE 78 01/11/2014 0245   GLUCOSE 78 01/03/2014 1405   BUN 17 01/11/2014 0245   BUN 18 01/03/2014 1405   CREATININE 1.16* 01/11/2014 0245   CALCIUM 8.5 01/11/2014 0245   PROT 6.4 01/11/2014 0245   PROT 6.2 01/03/2014 1405    ALBUMIN 3.2* 01/11/2014 0245   AST 13 01/11/2014 0245   ALT 5 01/11/2014 0245   ALKPHOS 52 01/11/2014 0245   BILITOT 0.3 01/11/2014 0245   GFRNONAA 45* 01/11/2014 0245   GFRAA 52* 01/11/2014 0245   Lipase     Component Value Date/Time   LIPASE 78* 01/10/2014 1705       Studies/Results: Ct Chest W Contrast  01/10/2014   CLINICAL DATA:  Persistent weight loss. Mid to lower abdominal pain. Diarrhea. Abdominal distention.  EXAM: CT CHEST, ABDOMEN, AND PELVIS WITH CONTRAST  TECHNIQUE: Multidetector CT imaging of the chest, abdomen and pelvis was performed following the standard protocol during bolus administration of intravenous contrast.  CONTRAST:  100 mL of Omnipaque 300 intravenous contrast.  COMPARISON:  CT, 05/15/2013.  FINDINGS: CT CHEST FINDINGS  No neck base or axillary masses or pathologically enlarged lymph nodes.  Heart is normal in size. Minimal coronary artery calcifications. Great vessels are normal in caliber. No mediastinal or hilar masses or adenopathy.  Mild lung base subsegmental atelectasis and/ scarring, greater on the left. 4 mm nodule in the lateral left lower lobe, image 31, series 3. There are few other tiny nodules bilaterally. No lung consolidation  or edema. No pleural effusion or pneumothorax.  CT ABDOMEN AND PELVIS FINDINGS  Moderate amount of free intraperitoneal air primarily collecting anteriorly. Most of the small bowel is dilated. Maximum diameter of the small bowel is approximately 5.8 cm. There is decompressed distal ileum and a possible transition point in the central abdomen. There is an abnormal position of the ligament of Treitz, with the third portion and fourth portion of the duodenum extending around the superior mesenteric vessels to lie in the right mid abdomen. This change in position may be postsurgical. This apparent small bowel obstruction may be on the basis of adhesions. No bowel mass is seen. There is no wall thickening. No pneumatosis intestinalis is  visualized.  The colon is mostly decompressed. There are diverticula along the sigmoid colon. No diverticulitis. A normal appendix is visualized.  Liver is normal in size. No liver mass or focal lesion. Normal spleen. Gallbladder surgically absent. No bile duct dilation. Normal pancreas. No adrenal masses. Stable 6.2 cm left renal cyst. Mild renal cortical thinning. No other renal abnormalities. No hydronephrosis. Ureters are not well visualized. Bladder is mostly decompressed but otherwise unremarkable.  Uterus is surgically absent.  No pelvic masses.  No pathologically enlarged lymph nodes.  Small to moderate amount of ascites.  Bony structures are demineralized. There are degenerative changes throughout the visualized spine. No osteoblastic or osteolytic lesions.  IMPRESSION: 1. Moderate amount of free intraperitoneal air suggesting a perforated viscus. This is similar to the appearance on the prior CT. 2. Small bowel obstruction with an apparent transition point in the right central abdomen suggesting obstruction based on adhesions. No pneumatosis intestinalis. No bowel wall thickening to suggest ischemia. Distal ileum is decompressed. Colon is mostly decompressed. 3. Small to moderate amount of ascites. 4. Few small pulmonary nodules, the largest a 4 mm nodule in the lateral left lower lobe, which is stable from the prior exam strongly suggesting a benign etiology. Mild lung base subsegmental atelectasis and/or scarring. 5. No CT evidence of malignancy. Patient's referring physician's office was contacted with this report at the time of this dictation.   Electronically Signed   By: Amie Portlandavid  Ormond M.D.   On: 01/10/2014 15:08   Ct Abdomen Pelvis W Contrast  01/10/2014   CLINICAL DATA:  Persistent weight loss. Mid to lower abdominal pain. Diarrhea. Abdominal distention.  EXAM: CT CHEST, ABDOMEN, AND PELVIS WITH CONTRAST  TECHNIQUE: Multidetector CT imaging of the chest, abdomen and pelvis was performed following  the standard protocol during bolus administration of intravenous contrast.  CONTRAST:  100 mL of Omnipaque 300 intravenous contrast.  COMPARISON:  CT, 05/15/2013.  FINDINGS: CT CHEST FINDINGS  No neck base or axillary masses or pathologically enlarged lymph nodes.  Heart is normal in size. Minimal coronary artery calcifications. Great vessels are normal in caliber. No mediastinal or hilar masses or adenopathy.  Mild lung base subsegmental atelectasis and/ scarring, greater on the left. 4 mm nodule in the lateral left lower lobe, image 31, series 3. There are few other tiny nodules bilaterally. No lung consolidation or edema. No pleural effusion or pneumothorax.  CT ABDOMEN AND PELVIS FINDINGS  Moderate amount of free intraperitoneal air primarily collecting anteriorly. Most of the small bowel is dilated. Maximum diameter of the small bowel is approximately 5.8 cm. There is decompressed distal ileum and a possible transition point in the central abdomen. There is an abnormal position of the ligament of Treitz, with the third portion and fourth portion of the duodenum extending around  the superior mesenteric vessels to lie in the right mid abdomen. This change in position may be postsurgical. This apparent small bowel obstruction may be on the basis of adhesions. No bowel mass is seen. There is no wall thickening. No pneumatosis intestinalis is visualized.  The colon is mostly decompressed. There are diverticula along the sigmoid colon. No diverticulitis. A normal appendix is visualized.  Liver is normal in size. No liver mass or focal lesion. Normal spleen. Gallbladder surgically absent. No bile duct dilation. Normal pancreas. No adrenal masses. Stable 6.2 cm left renal cyst. Mild renal cortical thinning. No other renal abnormalities. No hydronephrosis. Ureters are not well visualized. Bladder is mostly decompressed but otherwise unremarkable.  Uterus is surgically absent.  No pelvic masses.  No pathologically  enlarged lymph nodes.  Small to moderate amount of ascites.  Bony structures are demineralized. There are degenerative changes throughout the visualized spine. No osteoblastic or osteolytic lesions.  IMPRESSION: 1. Moderate amount of free intraperitoneal air suggesting a perforated viscus. This is similar to the appearance on the prior CT. 2. Small bowel obstruction with an apparent transition point in the right central abdomen suggesting obstruction based on adhesions. No pneumatosis intestinalis. No bowel wall thickening to suggest ischemia. Distal ileum is decompressed. Colon is mostly decompressed. 3. Small to moderate amount of ascites. 4. Few small pulmonary nodules, the largest a 4 mm nodule in the lateral left lower lobe, which is stable from the prior exam strongly suggesting a benign etiology. Mild lung base subsegmental atelectasis and/or scarring. 5. No CT evidence of malignancy. Patient's referring physician's office was contacted with this report at the time of this dictation.   Electronically Signed   By: Amie Portlandavid  Ormond M.D.   On: 01/10/2014 15:08    Anti-infectives: Anti-infectives   None       Assessment/Plan Hx of free intraperitoneal air with current finding on CT without peritonitis  Weight loss  Chronic diarrhea  EGD normal 04/2013  Irregular heartbeat currently ? A fib   Plan: -No emergent need for surgery.  -Would benefit from laparoscopy at some point  -Need to get heart issues / medical issues evaluated before any intervention done  -This is a perplexing problem. CT done for weight loss and exam last week is similar to tonight.  -Keep NPO, bowel rest, IVF, pain control, antiemetics -Would not cover with ABX  -Would ask GI to attach colonoscopy report as I can't seem to find it in Epic (Dr. Loreta AveMann did it in May or June?) -Order UGI with SBFT with water soluble contrast, discussed with radiologist who thinks this may not be very helpful given her CT contrast being  present, but they will proceed and attempt periodic KUB's to check the length of the small bowel. -Will follow     LOS: 1 day    DORT, MEGAN 01/11/2014, 8:32 AM Pager: [phone]

## 2014-01-12 DIAGNOSIS — E876 Hypokalemia: Secondary | ICD-10-CM

## 2014-01-12 DIAGNOSIS — R197 Diarrhea, unspecified: Secondary | ICD-10-CM

## 2014-01-12 DIAGNOSIS — K529 Noninfective gastroenteritis and colitis, unspecified: Secondary | ICD-10-CM | POA: Diagnosis present

## 2014-01-12 LAB — BASIC METABOLIC PANEL
BUN: 13 mg/dL (ref 6–23)
CO2: 15 mEq/L — ABNORMAL LOW (ref 19–32)
Calcium: 8.7 mg/dL (ref 8.4–10.5)
Chloride: 109 mEq/L (ref 96–112)
Creatinine, Ser: 1.17 mg/dL — ABNORMAL HIGH (ref 0.50–1.10)
GFR, EST AFRICAN AMERICAN: 51 mL/min — AB (ref >90)
GFR, EST NON AFRICAN AMERICAN: 44 mL/min — AB (ref >90)
Glucose, Bld: 81 mg/dL (ref 70–99)
POTASSIUM: 4.1 meq/L (ref 3.7–5.3)
SODIUM: 141 meq/L (ref 137–147)

## 2014-01-12 LAB — MAGNESIUM: Magnesium: 1.4 mg/dL — ABNORMAL LOW (ref 1.5–2.5)

## 2014-01-12 LAB — GLUCOSE, CAPILLARY: GLUCOSE-CAPILLARY: 107 mg/dL — AB (ref 70–99)

## 2014-01-12 MED ORDER — MAGNESIUM OXIDE 400 (241.3 MG) MG PO TABS: 400.0000 mg | ORAL_TABLET | Freq: Two times a day (BID) | ORAL | Status: DC

## 2014-01-12 MED ORDER — MAGNESIUM SULFATE 40 MG/ML IJ SOLN
2.0000 g | Freq: Once | INTRAMUSCULAR | Status: AC
  Administered 2014-01-12: 2 g via INTRAVENOUS
  Filled 2014-01-12: qty 50

## 2014-01-12 NOTE — Discharge Summary (Signed)
Physician Discharge Summary  Alicia Smith NFA:[id number] DOB: 31-May-1936 DOA: 01/10/2014  PCP: Bufford Spikes, DO  Admit date: 01/10/2014 Discharge date: 01/12/2014  Time spent: >45 minutes  Recommendations for Outpatient Follow-up:   check K+ and Mg+ levels regularly with ongoing diarrhea and replace   Discharge Diagnoses:  Principal Problem:   SBO (small bowel obstruction) Active Problems:   Chronic diarrhea   GERD (gastroesophageal reflux disease)   CKD (chronic kidney disease), stage III   Atrial fibrillation   Dehydration   Hypokalemia   Discharge Condition: stable  Diet recommendation: as tolerated  Filed Weights   01/11/14 0036 01/11/14 0527 01/12/14 0540  Weight: 65.227 kg (143 lb 12.8 oz) 65.227 kg (143 lb 12.8 oz) 66.134 kg (145 lb 12.8 oz)    History of present illness:  Alicia Smith is a 78 y.o. female Who has been losing weight and underwent a CT scan ordered by Dr Renato Gails. It was done on 6/18 and showed free air which was there in the past and a small bowel obstruction. See report below. Surgery evaluated the patient and asked medicine to have her admitted for "cardiac reasons".   Hospital Course:  Principal Problem:  SBO/ free air in abdomen  - patient has been asymptomatic specifically no symptoms of bowel obstruction prior to admission and now- she has chronic diarrhea which is unchanged. - small bowel follow through does not reveal any perforation  - per surgery started clears yesterday and advanced to solid food today-  - they do not plan on doing a laparoscopy for her free air that is chronic and likely not causing the weight loss   Active Problems:  Weight loss and chronic diarrhea  - need to complete outpatient work up   Dehydration  -having given agressive IVF- have advised pt to stop drinking the tea that she usually drinks and start drinking water in enough quantitiy to prevent further dehydration esp in the summer months  Hypokalemia and  hypomagnesemia  - likely due to diarrhea- hypomagnesemia may be due to chronic PPI use  - have aggressively replaced both IV and PO  - K  4.1 today (from 2.7 on admission)- will not add potassioum replacement yet - Mg+1.4- will d/c with oral Mag oxide- need to follow labs closely- PCP may need to stop PPI  GERD (gastroesophageal reflux disease)  - per our med/rec she takes Prevacid and Zantac at home- hopefully prevacid is not causing her diarrhea   CKD (chronic kidney disease), stage III  - stable   Atrial fibrillation? - EKG reveals NSR with PACs    Procedures:  none  Consultations:  Surgery  Discharge Exam: Filed Vitals:   01/12/14 0540  BP: 103/67  Pulse: 61  Temp: 97.9 F (36.6 C)  Resp: 16    General: AAO x 3, no distress Cardiovascular: RRR, no murmurs Respiratory: CTA b/l   Discharge Instructions You were cared for by a hospitalist during your hospital stay. If you have any questions about your discharge medications or the care you received while you were in the hospital after you are discharged, you can call the unit and asked to speak with the hospitalist on call if the hospitalist that took care of you is not available. Once you are discharged, your primary care physician will handle any further medical issues. Please note that NO REFILLS for any discharge medications will be authorized once you are discharged, as it is imperative that you return to your primary care physician (or establish  a relationship with a primary care physician if you do not have one) for your aftercare needs so that they can reassess your need for medications and monitor your lab values.      Discharge Instructions   Diet - low sodium heart healthy    Complete by:  As directed   Drink enough water to allow you to urinate 3-4 times a day     Discharge instructions    Complete by:  As directed   Please check Magnesium and postassium levels which were quite low- possibly from her  ongoing diarrhea.     Increase activity slowly    Complete by:  As directed             Medication List         lansoprazole 30 MG capsule  Commonly known as:  PREVACID  Take 30 mg by mouth daily as needed (acid relfux).     magnesium oxide 400 (241.3 MG) MG tablet  Commonly known as:  MAG-OX  Take 1 tablet (400 mg total) by mouth 2 (two) times daily.     PROBIOTIC DAILY PO  Take 1 capsule by mouth daily.     ranitidine 150 MG tablet  Commonly known as:  ZANTAC  Take 150 mg by mouth as needed for heartburn.       No Known Allergies    The results of significant diagnostics from this hospitalization (including imaging, microbiology, ancillary and laboratory) are listed below for reference.    Significant Diagnostic Studies: Ct Chest W Contrast  01/10/2014   CLINICAL DATA:  Persistent weight loss. Mid to lower abdominal pain. Diarrhea. Abdominal distention.  EXAM: CT CHEST, ABDOMEN, AND PELVIS WITH CONTRAST  TECHNIQUE: Multidetector CT imaging of the chest, abdomen and pelvis was performed following the standard protocol during bolus administration of intravenous contrast.  CONTRAST:  100 mL of Omnipaque 300 intravenous contrast.  COMPARISON:  CT, 05/15/2013.  FINDINGS: CT CHEST FINDINGS  No neck base or axillary masses or pathologically enlarged lymph nodes.  Heart is normal in size. Minimal coronary artery calcifications. Great vessels are normal in caliber. No mediastinal or hilar masses or adenopathy.  Mild lung base subsegmental atelectasis and/ scarring, greater on the left. 4 mm nodule in the lateral left lower lobe, image 31, series 3. There are few other tiny nodules bilaterally. No lung consolidation or edema. No pleural effusion or pneumothorax.  CT ABDOMEN AND PELVIS FINDINGS  Moderate amount of free intraperitoneal air primarily collecting anteriorly. Most of the small bowel is dilated. Maximum diameter of the small bowel is approximately 5.8 cm. There is decompressed  distal ileum and a possible transition point in the central abdomen. There is an abnormal position of the ligament of Treitz, with the third portion and fourth portion of the duodenum extending around the superior mesenteric vessels to lie in the right mid abdomen. This change in position may be postsurgical. This apparent small bowel obstruction may be on the basis of adhesions. No bowel mass is seen. There is no wall thickening. No pneumatosis intestinalis is visualized.  The colon is mostly decompressed. There are diverticula along the sigmoid colon. No diverticulitis. A normal appendix is visualized.  Liver is normal in size. No liver mass or focal lesion. Normal spleen. Gallbladder surgically absent. No bile duct dilation. Normal pancreas. No adrenal masses. Stable 6.2 cm left renal cyst. Mild renal cortical thinning. No other renal abnormalities. No hydronephrosis. Ureters are not well visualized. Bladder is mostly decompressed  but otherwise unremarkable.  Uterus is surgically absent.  No pelvic masses.  No pathologically enlarged lymph nodes.  Small to moderate amount of ascites.  Bony structures are demineralized. There are degenerative changes throughout the visualized spine. No osteoblastic or osteolytic lesions.  IMPRESSION: 1. Moderate amount of free intraperitoneal air suggesting a perforated viscus. This is similar to the appearance on the prior CT. 2. Small bowel obstruction with an apparent transition point in the right central abdomen suggesting obstruction based on adhesions. No pneumatosis intestinalis. No bowel wall thickening to suggest ischemia. Distal ileum is decompressed. Colon is mostly decompressed. 3. Small to moderate amount of ascites. 4. Few small pulmonary nodules, the largest a 4 mm nodule in the lateral left lower lobe, which is stable from the prior exam strongly suggesting a benign etiology. Mild lung base subsegmental atelectasis and/or scarring. 5. No CT evidence of malignancy.  Patient's referring physician's office was contacted with this report at the time of this dictation.   Electronically Signed   By: Amie Portlandavid  Ormond M.D.   On: 01/10/2014 15:08   Ct Abdomen Pelvis W Contrast  01/10/2014   CLINICAL DATA:  Persistent weight loss. Mid to lower abdominal pain. Diarrhea. Abdominal distention.  EXAM: CT CHEST, ABDOMEN, AND PELVIS WITH CONTRAST  TECHNIQUE: Multidetector CT imaging of the chest, abdomen and pelvis was performed following the standard protocol during bolus administration of intravenous contrast.  CONTRAST:  100 mL of Omnipaque 300 intravenous contrast.  COMPARISON:  CT, 05/15/2013.  FINDINGS: CT CHEST FINDINGS  No neck base or axillary masses or pathologically enlarged lymph nodes.  Heart is normal in size. Minimal coronary artery calcifications. Great vessels are normal in caliber. No mediastinal or hilar masses or adenopathy.  Mild lung base subsegmental atelectasis and/ scarring, greater on the left. 4 mm nodule in the lateral left lower lobe, image 31, series 3. There are few other tiny nodules bilaterally. No lung consolidation or edema. No pleural effusion or pneumothorax.  CT ABDOMEN AND PELVIS FINDINGS  Moderate amount of free intraperitoneal air primarily collecting anteriorly. Most of the small bowel is dilated. Maximum diameter of the small bowel is approximately 5.8 cm. There is decompressed distal ileum and a possible transition point in the central abdomen. There is an abnormal position of the ligament of Treitz, with the third portion and fourth portion of the duodenum extending around the superior mesenteric vessels to lie in the right mid abdomen. This change in position may be postsurgical. This apparent small bowel obstruction may be on the basis of adhesions. No bowel mass is seen. There is no wall thickening. No pneumatosis intestinalis is visualized.  The colon is mostly decompressed. There are diverticula along the sigmoid colon. No diverticulitis. A  normal appendix is visualized.  Liver is normal in size. No liver mass or focal lesion. Normal spleen. Gallbladder surgically absent. No bile duct dilation. Normal pancreas. No adrenal masses. Stable 6.2 cm left renal cyst. Mild renal cortical thinning. No other renal abnormalities. No hydronephrosis. Ureters are not well visualized. Bladder is mostly decompressed but otherwise unremarkable.  Uterus is surgically absent.  No pelvic masses.  No pathologically enlarged lymph nodes.  Small to moderate amount of ascites.  Bony structures are demineralized. There are degenerative changes throughout the visualized spine. No osteoblastic or osteolytic lesions.  IMPRESSION: 1. Moderate amount of free intraperitoneal air suggesting a perforated viscus. This is similar to the appearance on the prior CT. 2. Small bowel obstruction with an apparent transition point in  the right central abdomen suggesting obstruction based on adhesions. No pneumatosis intestinalis. No bowel wall thickening to suggest ischemia. Distal ileum is decompressed. Colon is mostly decompressed. 3. Small to moderate amount of ascites. 4. Few small pulmonary nodules, the largest a 4 mm nodule in the lateral left lower lobe, which is stable from the prior exam strongly suggesting a benign etiology. Mild lung base subsegmental atelectasis and/or scarring. 5. No CT evidence of malignancy. Patient's referring physician's office was contacted with this report at the time of this dictation.   Electronically Signed   By: Amie Portlandavid  Ormond M.D.   On: 01/10/2014 15:08   Dg Small Bowel  01/11/2014   CLINICAL DATA:  Intraperitoneal free air. After workup, no source for the free air is been identified. Water-soluble small bowel series requested to assess small bowel loops.  EXAM: SMALL BOWEL SERIES  COMPARISON:  CT scan from 01/10/2014.  TECHNIQUE: Following ingestion of thin barium, serial small bowel images were obtained including spot views of the terminal ileum.   FLUOROSCOPY TIME:  3 min and 31 seconds.  FINDINGS: Water-soluble contrast material was given by mouth. Attic overhead films were obtained to assess small-bowel transit. By approximately 2 hrs 45 min after beginning the study, contrast material is observed in the colon. Likely during the study, spot compression imaging was used to assess bowel loops. Small bowel loops are diffusely distended throughout. No evidence for wall or fold thickening. No small bowel stricture is identified. No contrast extravasation could be identified.  Intraperitoneal free air is visible on this study as on the CT scan from yesterday.  IMPRESSION: Water-soluble contrast material migrates through diffusely distended small bowel to the colon. No evidence for small bowel wall thickening or small bowel stricture. No contrast extravasation was observed.   Electronically Signed   By: Kennith CenterEric  Mansell M.D.   On: 01/11/2014 15:26    Microbiology: No results found for this or any previous visit (from the past 240 hour(s)).   Labs: Basic Metabolic Panel:  Recent Labs Lab 01/10/14 1705 01/11/14 0245 01/11/14 1600 01/12/14 0428  NA 140 140 144 141  K 3.2* 2.7* 3.8 4.1  CL 104 106 109 109  CO2 16* 16* 16* 15*  GLUCOSE 89 78 60* 81  BUN 20 17 15 13   CREATININE 1.20* 1.16* 1.18* 1.17*  CALCIUM 9.0 8.5 9.0 8.7  MG  --  0.8* 1.5 1.4*  PHOS  --  3.2  --   --    Liver Function Tests:  Recent Labs Lab 01/10/14 1705 01/11/14 0245 01/11/14 1600  AST 15 13 14   ALT 7 5 7   ALKPHOS 64 52 55  BILITOT 0.3 0.3 0.4  PROT 7.5 6.4 6.8  ALBUMIN 3.7 3.2* 3.5    Recent Labs Lab 01/10/14 1705  LIPASE 78*   No results found for this basename: AMMONIA,  in the last 168 hours CBC:  Recent Labs Lab 01/10/14 1705 01/11/14 0245 01/11/14 1600  WBC 6.2 5.5 6.3  NEUTROABS 3.0 2.8  --   HGB 11.3* 10.4* 10.9*  HCT 34.6* 31.9* 33.3*  MCV 83.0 82.6 82.8  PLT 247 195 201   Cardiac Enzymes: No results found for this basename:  CKTOTAL, CKMB, CKMBINDEX, TROPONINI,  in the last 168 hours BNP: BNP (last 3 results) No results found for this basename: PROBNP,  in the last 8760 hours CBG:  Recent Labs Lab 01/11/14 0731 01/12/14 0747  GLUCAP 71 107*       Signed:  Teliah Buffalo,  MD [facility] 01/12/2014, 12:29 PM

## 2014-01-12 NOTE — Progress Notes (Signed)
Subjective: She feels fine. Denies pain or nausea. Begging for food.States she is still having the diarrhea which is chronic.  Afebrile. Heart rate 61. BP 103/67.Excellent urine output.  Potassium 4.1. Creatinine 1.17. Glucose 81.  Upper GI shows Diffusely distended small bowel, no transition point, water-soluble contrast migrates through small bowel to the colon. No extravasation.    Objective: Vital signs in last 24 hours: Temp:  [97.9 F (36.6 C)-98.1 F (36.7 C)] 97.9 F (36.6 C) (06/20 0540) Pulse Rate:  [61-82] 61 (06/20 0540) Resp:  [16-17] 16 (06/20 0540) BP: (103-106)/(66-70) 103/67 mmHg (06/20 0540) SpO2:  [98 %-100 %] 100 % (06/20 0540) Weight:  [145 lb 12.8 oz (66.134 kg)] 145 lb 12.8 oz (66.134 kg) (06/20 0540) Last BM Date: 01/11/14  Intake/Output from previous day: 06/19 0701 - 06/20 0700 In: 0  Out: 250 [Urine:250] Intake/Output this shift: Total I/O In: 538 [P.O.:538] Out: -   General appearance: alert. No distress. Extremely talkative.. Mental status normal. Resp: clear to auscultation bilaterally GI: abdomen is soft. There is no tenderness. There is no mass. There is no hernia detected.  Lab Results:   Recent Labs  01/11/14 0245 01/11/14 1600  WBC 5.5 6.3  HGB 10.4* 10.9*  HCT 31.9* 33.3*  PLT 195 201   BMET  Recent Labs  01/11/14 1600 01/12/14 0428  NA 144 141  K 3.8 4.1  CL 109 109  CO2 16* 15*  GLUCOSE 60* 81  BUN 15 13  CREATININE 1.18* 1.17*  CALCIUM 9.0 8.7   PT/INR  Recent Labs  01/11/14 0245  LABPROT 14.5  INR 1.15   ABG No results found for this basename: PHART, PCO2, PO2, HCO3,  in the last 72 hours  Studies/Results: Ct Chest W Contrast  01/10/2014   CLINICAL DATA:  Persistent weight loss. Mid to lower abdominal pain. Diarrhea. Abdominal distention.  EXAM: CT CHEST, ABDOMEN, AND PELVIS WITH CONTRAST  TECHNIQUE: Multidetector CT imaging of the chest, abdomen and pelvis was performed following the standard  protocol during bolus administration of intravenous contrast.  CONTRAST:  100 mL of Omnipaque 300 intravenous contrast.  COMPARISON:  CT, 05/15/2013.  FINDINGS: CT CHEST FINDINGS  No neck base or axillary masses or pathologically enlarged lymph nodes.  Heart is normal in size. Minimal coronary artery calcifications. Great vessels are normal in caliber. No mediastinal or hilar masses or adenopathy.  Mild lung base subsegmental atelectasis and/ scarring, greater on the left. 4 mm nodule in the lateral left lower lobe, image 31, series 3. There are few other tiny nodules bilaterally. No lung consolidation or edema. No pleural effusion or pneumothorax.  CT ABDOMEN AND PELVIS FINDINGS  Moderate amount of free intraperitoneal air primarily collecting anteriorly. Most of the small bowel is dilated. Maximum diameter of the small bowel is approximately 5.8 cm. There is decompressed distal ileum and a possible transition point in the central abdomen. There is an abnormal position of the ligament of Treitz, with the third portion and fourth portion of the duodenum extending around the superior mesenteric vessels to lie in the right mid abdomen. This change in position may be postsurgical. This apparent small bowel obstruction may be on the basis of adhesions. No bowel mass is seen. There is no wall thickening. No pneumatosis intestinalis is visualized.  The colon is mostly decompressed. There are diverticula along the sigmoid colon. No diverticulitis. A normal appendix is visualized.  Liver is normal in size. No liver mass or focal lesion. Normal spleen. Gallbladder surgically  absent. No bile duct dilation. Normal pancreas. No adrenal masses. Stable 6.2 cm left renal cyst. Mild renal cortical thinning. No other renal abnormalities. No hydronephrosis. Ureters are not well visualized. Bladder is mostly decompressed but otherwise unremarkable.  Uterus is surgically absent.  No pelvic masses.  No pathologically enlarged lymph  nodes.  Small to moderate amount of ascites.  Bony structures are demineralized. There are degenerative changes throughout the visualized spine. No osteoblastic or osteolytic lesions.  IMPRESSION: 1. Moderate amount of free intraperitoneal air suggesting a perforated viscus. This is similar to the appearance on the prior CT. 2. Small bowel obstruction with an apparent transition point in the right central abdomen suggesting obstruction based on adhesions. No pneumatosis intestinalis. No bowel wall thickening to suggest ischemia. Distal ileum is decompressed. Colon is mostly decompressed. 3. Small to moderate amount of ascites. 4. Few small pulmonary nodules, the largest a 4 mm nodule in the lateral left lower lobe, which is stable from the prior exam strongly suggesting a benign etiology. Mild lung base subsegmental atelectasis and/or scarring. 5. No CT evidence of malignancy. Patient's referring physician's office was contacted with this report at the time of this dictation.   Electronically Signed   By: Amie Portlandavid  Ormond M.D.   On: 01/10/2014 15:08   Ct Abdomen Pelvis W Contrast  01/10/2014   CLINICAL DATA:  Persistent weight loss. Mid to lower abdominal pain. Diarrhea. Abdominal distention.  EXAM: CT CHEST, ABDOMEN, AND PELVIS WITH CONTRAST  TECHNIQUE: Multidetector CT imaging of the chest, abdomen and pelvis was performed following the standard protocol during bolus administration of intravenous contrast.  CONTRAST:  100 mL of Omnipaque 300 intravenous contrast.  COMPARISON:  CT, 05/15/2013.  FINDINGS: CT CHEST FINDINGS  No neck base or axillary masses or pathologically enlarged lymph nodes.  Heart is normal in size. Minimal coronary artery calcifications. Great vessels are normal in caliber. No mediastinal or hilar masses or adenopathy.  Mild lung base subsegmental atelectasis and/ scarring, greater on the left. 4 mm nodule in the lateral left lower lobe, image 31, series 3. There are few other tiny nodules  bilaterally. No lung consolidation or edema. No pleural effusion or pneumothorax.  CT ABDOMEN AND PELVIS FINDINGS  Moderate amount of free intraperitoneal air primarily collecting anteriorly. Most of the small bowel is dilated. Maximum diameter of the small bowel is approximately 5.8 cm. There is decompressed distal ileum and a possible transition point in the central abdomen. There is an abnormal position of the ligament of Treitz, with the third portion and fourth portion of the duodenum extending around the superior mesenteric vessels to lie in the right mid abdomen. This change in position may be postsurgical. This apparent small bowel obstruction may be on the basis of adhesions. No bowel mass is seen. There is no wall thickening. No pneumatosis intestinalis is visualized.  The colon is mostly decompressed. There are diverticula along the sigmoid colon. No diverticulitis. A normal appendix is visualized.  Liver is normal in size. No liver mass or focal lesion. Normal spleen. Gallbladder surgically absent. No bile duct dilation. Normal pancreas. No adrenal masses. Stable 6.2 cm left renal cyst. Mild renal cortical thinning. No other renal abnormalities. No hydronephrosis. Ureters are not well visualized. Bladder is mostly decompressed but otherwise unremarkable.  Uterus is surgically absent.  No pelvic masses.  No pathologically enlarged lymph nodes.  Small to moderate amount of ascites.  Bony structures are demineralized. There are degenerative changes throughout the visualized spine. No osteoblastic or  osteolytic lesions.  IMPRESSION: 1. Moderate amount of free intraperitoneal air suggesting a perforated viscus. This is similar to the appearance on the prior CT. 2. Small bowel obstruction with an apparent transition point in the right central abdomen suggesting obstruction based on adhesions. No pneumatosis intestinalis. No bowel wall thickening to suggest ischemia. Distal ileum is decompressed. Colon is  mostly decompressed. 3. Small to moderate amount of ascites. 4. Few small pulmonary nodules, the largest a 4 mm nodule in the lateral left lower lobe, which is stable from the prior exam strongly suggesting a benign etiology. Mild lung base subsegmental atelectasis and/or scarring. 5. No CT evidence of malignancy. Patient's referring physician's office was contacted with this report at the time of this dictation.   Electronically Signed   By: Amie Portland M.D.   On: 01/10/2014 15:08   Dg Small Bowel  01/11/2014   CLINICAL DATA:  Intraperitoneal free air. After workup, no source for the free air is been identified. Water-soluble small bowel series requested to assess small bowel loops.  EXAM: SMALL BOWEL SERIES  COMPARISON:  CT scan from 01/10/2014.  TECHNIQUE: Following ingestion of thin barium, serial small bowel images were obtained including spot views of the terminal ileum.  FLUOROSCOPY TIME:  3 min and 31 seconds.  FINDINGS: Water-soluble contrast material was given by mouth. Attic overhead films were obtained to assess small-bowel transit. By approximately 2 hrs 45 min after beginning the study, contrast material is observed in the colon. Likely during the study, spot compression imaging was used to assess bowel loops. Small bowel loops are diffusely distended throughout. No evidence for wall or fold thickening. No small bowel stricture is identified. No contrast extravasation could be identified.  Intraperitoneal free air is visible on this study as on the CT scan from yesterday.  IMPRESSION: Water-soluble contrast material migrates through diffusely distended small bowel to the colon. No evidence for small bowel wall thickening or small bowel stricture. No contrast extravasation was observed.   Electronically Signed   By: Kennith Center M.D.   On: 01/11/2014 15:26    Anti-infectives: Anti-infectives   None      Assessment/Plan:  Past history and current evidence for free intraperitoneal air.  This appears to be a benign process. No clinical evidence of perforated viscus.  At this point in time, I am personally  not inclined to perform laparoscopy. Advance diet  Weight loss and diarrhea. It sounds like she is having some type of malabsorption process, likely small bowel in nature. She will need an ongoing detailed gastroenterology followup. She states that she has transferred her care from Dr. Loreta Ave to Dr. Bosie Clos and has an appointment with Dr. Bosie Clos soon.  Recommend GI consultation with Dr. Bosie Clos now,  or discharge home with outpatient followup.    LOS: 2 days    INGRAM,HAYWOOD M 01/12/2014

## 2014-01-14 ENCOUNTER — Ambulatory Visit (INDEPENDENT_AMBULATORY_CARE_PROVIDER_SITE_OTHER): Payer: Medicare Other | Admitting: Internal Medicine

## 2014-01-14 DIAGNOSIS — D510 Vitamin B12 deficiency anemia due to intrinsic factor deficiency: Secondary | ICD-10-CM

## 2014-01-14 MED ORDER — CYANOCOBALAMIN 1000 MCG/ML IJ SOLN: 1000.0000 ug | Freq: Once | INTRAMUSCULAR | Status: AC

## 2014-01-15 ENCOUNTER — Encounter: Payer: Self-pay | Admitting: Internal Medicine

## 2014-01-19 ENCOUNTER — Inpatient Hospital Stay (HOSPITAL_COMMUNITY)
Admission: EM | Admit: 2014-01-19 | Discharge: 2014-02-08 | DRG: 356 | Disposition: A | Discharge status: Home / Self Care | Payer: Medicare Other | Attending: General Surgery | Admitting: General Surgery

## 2014-01-19 DIAGNOSIS — F1210 Cannabis abuse, uncomplicated: Secondary | ICD-10-CM | POA: Diagnosis present

## 2014-01-19 DIAGNOSIS — I498 Other specified cardiac arrhythmias: Secondary | ICD-10-CM | POA: Diagnosis not present

## 2014-01-19 DIAGNOSIS — K560 Paralytic ileus: Secondary | ICD-10-CM | POA: Diagnosis not present

## 2014-01-19 DIAGNOSIS — Z87891 Personal history of nicotine dependence: Secondary | ICD-10-CM

## 2014-01-19 DIAGNOSIS — I824Z9 Acute embolism and thrombosis of unspecified deep veins of unspecified distal lower extremity: Secondary | ICD-10-CM | POA: Diagnosis not present

## 2014-01-19 DIAGNOSIS — R109 Unspecified abdominal pain: Secondary | ICD-10-CM

## 2014-01-19 DIAGNOSIS — K668 Other specified disorders of peritoneum: Principal | ICD-10-CM | POA: Diagnosis present

## 2014-01-19 DIAGNOSIS — N182 Chronic kidney disease, stage 2 (mild): Secondary | ICD-10-CM

## 2014-01-19 DIAGNOSIS — N179 Acute kidney failure, unspecified: Secondary | ICD-10-CM | POA: Diagnosis present

## 2014-01-19 DIAGNOSIS — N289 Disorder of kidney and ureter, unspecified: Secondary | ICD-10-CM | POA: Diagnosis not present

## 2014-01-19 DIAGNOSIS — K559 Vascular disorder of intestine, unspecified: Secondary | ICD-10-CM

## 2014-01-19 DIAGNOSIS — Y838 Other surgical procedures as the cause of abnormal reaction of the patient, or of later complication, without mention of misadventure at the time of the procedure: Secondary | ICD-10-CM | POA: Diagnosis present

## 2014-01-19 DIAGNOSIS — I82409 Acute embolism and thrombosis of unspecified deep veins of unspecified lower extremity: Secondary | ICD-10-CM | POA: Diagnosis not present

## 2014-01-19 DIAGNOSIS — E440 Moderate protein-calorie malnutrition: Secondary | ICD-10-CM | POA: Diagnosis present

## 2014-01-19 DIAGNOSIS — I2699 Other pulmonary embolism without acute cor pulmonale: Secondary | ICD-10-CM | POA: Diagnosis not present

## 2014-01-19 DIAGNOSIS — T81718A Complication of other artery following a procedure, not elsewhere classified, initial encounter: Secondary | ICD-10-CM

## 2014-01-19 DIAGNOSIS — E538 Deficiency of other specified B group vitamins: Secondary | ICD-10-CM | POA: Diagnosis present

## 2014-01-19 DIAGNOSIS — E7800 Pure hypercholesterolemia, unspecified: Secondary | ICD-10-CM | POA: Diagnosis present

## 2014-01-19 DIAGNOSIS — K219 Gastro-esophageal reflux disease without esophagitis: Secondary | ICD-10-CM | POA: Diagnosis present

## 2014-01-19 DIAGNOSIS — R791 Abnormal coagulation profile: Secondary | ICD-10-CM | POA: Diagnosis not present

## 2014-01-19 DIAGNOSIS — K929 Disease of digestive system, unspecified: Secondary | ICD-10-CM | POA: Diagnosis not present

## 2014-01-20 ENCOUNTER — Emergency Department (HOSPITAL_COMMUNITY): Payer: Medicare Other

## 2014-01-20 ENCOUNTER — Encounter (HOSPITAL_COMMUNITY): Payer: Medicare Other | Admitting: Anesthesiology

## 2014-01-20 ENCOUNTER — Encounter (HOSPITAL_COMMUNITY): Payer: Self-pay | Admitting: Emergency Medicine

## 2014-01-20 ENCOUNTER — Emergency Department (HOSPITAL_COMMUNITY): Payer: Medicare Other | Admitting: Anesthesiology

## 2014-01-20 ENCOUNTER — Encounter (HOSPITAL_COMMUNITY): Admission: EM | Disposition: A | Discharge status: Home / Self Care | Payer: Self-pay | Source: Home / Self Care

## 2014-01-20 DIAGNOSIS — K219 Gastro-esophageal reflux disease without esophagitis: Secondary | ICD-10-CM | POA: Diagnosis present

## 2014-01-20 DIAGNOSIS — I82409 Acute embolism and thrombosis of unspecified deep veins of unspecified lower extremity: Secondary | ICD-10-CM | POA: Diagnosis not present

## 2014-01-20 DIAGNOSIS — K668 Other specified disorders of peritoneum: Secondary | ICD-10-CM | POA: Diagnosis present

## 2014-01-20 DIAGNOSIS — K929 Disease of digestive system, unspecified: Secondary | ICD-10-CM | POA: Diagnosis not present

## 2014-01-20 DIAGNOSIS — R1011 Right upper quadrant pain: Secondary | ICD-10-CM

## 2014-01-20 DIAGNOSIS — N289 Disorder of kidney and ureter, unspecified: Secondary | ICD-10-CM | POA: Diagnosis not present

## 2014-01-20 DIAGNOSIS — R109 Unspecified abdominal pain: Secondary | ICD-10-CM | POA: Diagnosis present

## 2014-01-20 DIAGNOSIS — R197 Diarrhea, unspecified: Secondary | ICD-10-CM

## 2014-01-20 DIAGNOSIS — I2699 Other pulmonary embolism without acute cor pulmonale: Secondary | ICD-10-CM | POA: Diagnosis not present

## 2014-01-20 DIAGNOSIS — I824Z9 Acute embolism and thrombosis of unspecified deep veins of unspecified distal lower extremity: Secondary | ICD-10-CM | POA: Diagnosis not present

## 2014-01-20 DIAGNOSIS — F1210 Cannabis abuse, uncomplicated: Secondary | ICD-10-CM | POA: Diagnosis present

## 2014-01-20 DIAGNOSIS — D649 Anemia, unspecified: Secondary | ICD-10-CM

## 2014-01-20 DIAGNOSIS — R791 Abnormal coagulation profile: Secondary | ICD-10-CM | POA: Diagnosis not present

## 2014-01-20 DIAGNOSIS — E7800 Pure hypercholesterolemia, unspecified: Secondary | ICD-10-CM | POA: Diagnosis present

## 2014-01-20 DIAGNOSIS — K560 Paralytic ileus: Secondary | ICD-10-CM | POA: Diagnosis not present

## 2014-01-20 DIAGNOSIS — E538 Deficiency of other specified B group vitamins: Secondary | ICD-10-CM | POA: Diagnosis present

## 2014-01-20 DIAGNOSIS — R110 Nausea: Secondary | ICD-10-CM

## 2014-01-20 DIAGNOSIS — E440 Moderate protein-calorie malnutrition: Secondary | ICD-10-CM | POA: Diagnosis present

## 2014-01-20 DIAGNOSIS — Y838 Other surgical procedures as the cause of abnormal reaction of the patient, or of later complication, without mention of misadventure at the time of the procedure: Secondary | ICD-10-CM | POA: Diagnosis present

## 2014-01-20 DIAGNOSIS — I498 Other specified cardiac arrhythmias: Secondary | ICD-10-CM | POA: Diagnosis not present

## 2014-01-20 DIAGNOSIS — Z87891 Personal history of nicotine dependence: Secondary | ICD-10-CM | POA: Diagnosis not present

## 2014-01-20 DIAGNOSIS — N179 Acute kidney failure, unspecified: Secondary | ICD-10-CM | POA: Diagnosis present

## 2014-01-20 HISTORY — PX: LAPAROTOMY: SHX154

## 2014-01-20 LAB — TYPE AND SCREEN
ABO/RH(D): O POS
Antibody Screen: NEGATIVE

## 2014-01-20 LAB — COMPREHENSIVE METABOLIC PANEL
ALK PHOS: 89 U/L (ref 39–117)
ALT: 9 U/L (ref 0–35)
AST: 18 U/L (ref 0–37)
Albumin: 3.7 g/dL (ref 3.5–5.2)
BUN: 22 mg/dL (ref 6–23)
CHLORIDE: 101 meq/L (ref 96–112)
CO2: 21 meq/L (ref 19–32)
Calcium: 9.4 mg/dL (ref 8.4–10.5)
Creatinine, Ser: 1.24 mg/dL — ABNORMAL HIGH (ref 0.50–1.10)
GFR calc non Af Amer: 41 mL/min — ABNORMAL LOW (ref >90)
GFR, EST AFRICAN AMERICAN: 48 mL/min — AB (ref >90)
GLUCOSE: 110 mg/dL — AB (ref 70–99)
POTASSIUM: 4.1 meq/L (ref 3.7–5.3)
SODIUM: 142 meq/L (ref 137–147)
TOTAL PROTEIN: 7.4 g/dL (ref 6.0–8.3)

## 2014-01-20 LAB — MAGNESIUM: Magnesium: 1.8 mg/dL (ref 1.5–2.5)

## 2014-01-20 LAB — CBC WITH DIFFERENTIAL/PLATELET
BASOS PCT: 1 % (ref 0–1)
Basophils Absolute: 0.1 10*3/uL (ref 0.0–0.1)
Eosinophils Absolute: 0.1 10*3/uL (ref 0.0–0.7)
Eosinophils Relative: 1 % (ref 0–5)
HEMATOCRIT: 32.6 % — AB (ref 36.0–46.0)
HEMOGLOBIN: 10.4 g/dL — AB (ref 12.0–15.0)
Lymphocytes Relative: 39 % (ref 12–46)
Lymphs Abs: 3.8 10*3/uL (ref 0.7–4.0)
MCH: 26.7 pg (ref 26.0–34.0)
MCHC: 31.9 g/dL (ref 30.0–36.0)
MCV: 83.8 fL (ref 78.0–100.0)
Monocytes Absolute: 0.7 10*3/uL (ref 0.1–1.0)
Monocytes Relative: 7 % (ref 3–12)
NEUTROS ABS: 5 10*3/uL (ref 1.7–7.7)
NEUTROS PCT: 52 % (ref 43–77)
Platelets: ADEQUATE 10*3/uL (ref 150–400)
RBC: 3.89 MIL/uL (ref 3.87–5.11)
RDW: 15.2 % (ref 11.5–15.5)
WBC: 9.7 10*3/uL (ref 4.0–10.5)

## 2014-01-20 LAB — I-STAT CG4 LACTIC ACID, ED: Lactic Acid, Venous: 3.46 mmol/L — ABNORMAL HIGH (ref 0.5–2.2)

## 2014-01-20 LAB — I-STAT TROPONIN, ED: Troponin i, poc: 0.01 ng/mL (ref 0.00–0.08)

## 2014-01-20 LAB — ABO/RH: ABO/RH(D): O POS

## 2014-01-20 LAB — LIPASE, BLOOD: Lipase: 101 U/L — ABNORMAL HIGH (ref 11–59)

## 2014-01-20 SURGERY — LAPAROTOMY, EXPLORATORY
Anesthesia: General | Site: Abdomen

## 2014-01-20 MED ORDER — MORPHINE SULFATE 2 MG/ML IJ SOLN
1.0000 mg | INTRAMUSCULAR | Status: DC | PRN
  Administered 2014-01-20 – 2014-01-27 (×26): 2 mg via INTRAVENOUS
  Filled 2014-01-20 (×27): qty 1

## 2014-01-20 MED ORDER — 0.9 % SODIUM CHLORIDE (POUR BTL) OPTIME
TOPICAL | Status: DC | PRN
  Administered 2014-01-20 (×2): 1000 mL

## 2014-01-20 MED ORDER — IOHEXOL 300 MG/ML  SOLN
25.0000 mL | Freq: Once | INTRAMUSCULAR | Status: AC | PRN
  Administered 2014-01-20: 25 mL via ORAL

## 2014-01-20 MED ORDER — OXYCODONE HCL 5 MG PO TABS: 5.0000 mg | ORAL_TABLET | Freq: Once | ORAL | Status: DC | PRN

## 2014-01-20 MED ORDER — ONDANSETRON HCL 4 MG/2ML IJ SOLN
4.0000 mg | Freq: Four times a day (QID) | INTRAMUSCULAR | Status: DC | PRN
Start: 2014-01-20 — End: 2014-02-08
  Administered 2014-01-20 – 2014-02-05 (×12): 4 mg via INTRAVENOUS
  Filled 2014-01-20 (×13): qty 2

## 2014-01-20 MED ORDER — PROPOFOL 10 MG/ML IV BOLUS
INTRAVENOUS | Status: AC
  Filled 2014-01-20: qty 20

## 2014-01-20 MED ORDER — ONDANSETRON HCL 4 MG PO TABS
4.0000 mg | ORAL_TABLET | Freq: Four times a day (QID) | ORAL | Status: DC | PRN
  Administered 2014-01-28 – 2014-02-07 (×4): 4 mg via ORAL
  Filled 2014-01-20 (×5): qty 1

## 2014-01-20 MED ORDER — MIDAZOLAM HCL 5 MG/5ML IJ SOLN
INTRAMUSCULAR | Status: DC | PRN
Start: 2014-01-20 — End: 2014-01-20
  Administered 2014-01-20 (×2): 1 mg via INTRAVENOUS

## 2014-01-20 MED ORDER — SODIUM CHLORIDE 0.9 % IV SOLN
3.0000 g | Freq: Once | INTRAVENOUS | Status: AC
  Administered 2014-01-20: 3 g via INTRAVENOUS
  Filled 2014-01-20: qty 3

## 2014-01-20 MED ORDER — ARTIFICIAL TEARS OP OINT
TOPICAL_OINTMENT | OPHTHALMIC | Status: DC | PRN
  Administered 2014-01-20: 1 via OPHTHALMIC

## 2014-01-20 MED ORDER — ALBUMIN HUMAN 5 % IV SOLN
INTRAVENOUS | Status: DC | PRN
  Administered 2014-01-20 (×2): via INTRAVENOUS

## 2014-01-20 MED ORDER — MEPERIDINE HCL 25 MG/ML IJ SOLN: 6.2500 mg | INTRAMUSCULAR | Status: DC | PRN

## 2014-01-20 MED ORDER — KCL IN DEXTROSE-NACL 20-5-0.45 MEQ/L-%-% IV SOLN
INTRAVENOUS | Status: DC
  Administered 2014-01-20 – 2014-01-22 (×3): via INTRAVENOUS
  Filled 2014-01-20 (×6): qty 1000

## 2014-01-20 MED ORDER — PROPOFOL 10 MG/ML IV BOLUS
INTRAVENOUS | Status: DC | PRN
  Administered 2014-01-20: 80 mg via INTRAVENOUS

## 2014-01-20 MED ORDER — FENTANYL CITRATE 0.05 MG/ML IJ SOLN
INTRAMUSCULAR | Status: DC | PRN
  Administered 2014-01-20: 100 ug via INTRAVENOUS
  Administered 2014-01-20: 150 ug via INTRAVENOUS

## 2014-01-20 MED ORDER — PROMETHAZINE HCL 25 MG/ML IJ SOLN: 6.2500 mg | INTRAMUSCULAR | Status: DC | PRN

## 2014-01-20 MED ORDER — HYDROMORPHONE HCL PF 1 MG/ML IJ SOLN
INTRAMUSCULAR | Status: AC
  Filled 2014-01-20: qty 1

## 2014-01-20 MED ORDER — LIDOCAINE HCL (CARDIAC) 20 MG/ML IV SOLN
INTRAVENOUS | Status: DC | PRN
  Administered 2014-01-20: 50 mg via INTRAVENOUS

## 2014-01-20 MED ORDER — ONDANSETRON HCL 4 MG/2ML IJ SOLN
4.0000 mg | Freq: Once | INTRAMUSCULAR | Status: AC
  Filled 2014-01-20: qty 2

## 2014-01-20 MED ORDER — SODIUM CHLORIDE 0.9 % IV SOLN
10.0000 mg | INTRAVENOUS | Status: DC | PRN
  Administered 2014-01-20: 15 ug/min via INTRAVENOUS

## 2014-01-20 MED ORDER — SODIUM CHLORIDE 0.9 % IV SOLN
3.0000 g | INTRAVENOUS | Status: DC | PRN
  Administered 2014-01-20: 3 g via INTRAVENOUS

## 2014-01-20 MED ORDER — PANTOPRAZOLE SODIUM 40 MG IV SOLR
40.0000 mg | INTRAVENOUS | Status: DC
  Administered 2014-01-20 – 2014-02-04 (×17): 40 mg via INTRAVENOUS
  Filled 2014-01-20 (×22): qty 40

## 2014-01-20 MED ORDER — ONDANSETRON HCL 4 MG/2ML IJ SOLN
INTRAMUSCULAR | Status: AC
  Administered 2014-01-20: 4 mg
  Filled 2014-01-20: qty 2

## 2014-01-20 MED ORDER — MORPHINE SULFATE 4 MG/ML IJ SOLN
4.0000 mg | Freq: Once | INTRAMUSCULAR | Status: AC
  Administered 2014-01-20: 4 mg via INTRAVENOUS
  Filled 2014-01-20: qty 1

## 2014-01-20 MED ORDER — SODIUM CHLORIDE 0.9 % IV BOLUS (SEPSIS)
1000.0000 mL | Freq: Once | INTRAVENOUS | Status: AC
  Administered 2014-01-20: 1000 mL via INTRAVENOUS

## 2014-01-20 MED ORDER — ENOXAPARIN SODIUM 40 MG/0.4ML ~~LOC~~ SOLN
40.0000 mg | SUBCUTANEOUS | Status: DC
  Administered 2014-01-21 – 2014-01-27 (×7): 40 mg via SUBCUTANEOUS
  Filled 2014-01-20 (×8): qty 0.4

## 2014-01-20 MED ORDER — BIOTENE DRY MOUTH MT LIQD
15.0000 mL | Freq: Two times a day (BID) | OROMUCOSAL | Status: DC
  Administered 2014-01-20 – 2014-02-05 (×26): 15 mL via OROMUCOSAL

## 2014-01-20 MED ORDER — SODIUM CHLORIDE 0.9 % IV SOLN
INTRAVENOUS | Status: DC | PRN
  Administered 2014-01-20 (×2): via INTRAVENOUS

## 2014-01-20 MED ORDER — FENTANYL CITRATE 0.05 MG/ML IJ SOLN
INTRAMUSCULAR | Status: AC
  Filled 2014-01-20: qty 5

## 2014-01-20 MED ORDER — ROCURONIUM BROMIDE 100 MG/10ML IV SOLN
INTRAVENOUS | Status: DC | PRN
  Administered 2014-01-20: 30 mg via INTRAVENOUS
  Administered 2014-01-20: 40 mg via INTRAVENOUS

## 2014-01-20 MED ORDER — MIDAZOLAM HCL 2 MG/2ML IJ SOLN
INTRAMUSCULAR | Status: AC
  Filled 2014-01-20: qty 2

## 2014-01-20 MED ORDER — FAMOTIDINE IN NACL 20-0.9 MG/50ML-% IV SOLN
20.0000 mg | Freq: Once | INTRAVENOUS | Status: AC
  Administered 2014-01-20: 20 mg via INTRAVENOUS
  Filled 2014-01-20: qty 50

## 2014-01-20 MED ORDER — LACTATED RINGERS IV SOLN
INTRAVENOUS | Status: DC | PRN
  Administered 2014-01-20 (×2): via INTRAVENOUS

## 2014-01-20 MED ORDER — OXYCODONE HCL 5 MG/5ML PO SOLN: 5.0000 mg | Freq: Once | ORAL | Status: DC | PRN

## 2014-01-20 MED ORDER — SUCCINYLCHOLINE CHLORIDE 20 MG/ML IJ SOLN
INTRAMUSCULAR | Status: DC | PRN
  Administered 2014-01-20: 100 mg via INTRAVENOUS

## 2014-01-20 MED ORDER — HYDROMORPHONE HCL PF 1 MG/ML IJ SOLN
0.2500 mg | INTRAMUSCULAR | Status: DC | PRN
  Administered 2014-01-20 (×2): 0.5 mg via INTRAVENOUS

## 2014-01-20 MED ORDER — LORAZEPAM 2 MG/ML IJ SOLN
1.0000 mg | Freq: Once | INTRAMUSCULAR | Status: AC
  Administered 2014-01-20: 1 mg via INTRAVENOUS
  Filled 2014-01-20: qty 1

## 2014-01-20 MED ORDER — ONDANSETRON HCL 4 MG/2ML IJ SOLN
4.0000 mg | Freq: Once | INTRAMUSCULAR | Status: AC
  Administered 2014-01-20: 4 mg via INTRAVENOUS

## 2014-01-20 MED ORDER — ONDANSETRON HCL 4 MG/2ML IJ SOLN
INTRAMUSCULAR | Status: DC | PRN
  Administered 2014-01-20 (×2): 4 mg via INTRAVENOUS

## 2014-01-20 SURGICAL SUPPLY — 55 items
BLADE STAB KNIFE 15DEG (BLADE) ×3 IMPLANT
BLADE SURG ROTATE 9660 (MISCELLANEOUS) IMPLANT
CANISTER SUCTION 2500CC (MISCELLANEOUS) ×3 IMPLANT
CHLORAPREP W/TINT 26ML (MISCELLANEOUS) ×3 IMPLANT
COVER MAYO STAND STRL (DRAPES) IMPLANT
COVER SURGICAL LIGHT HANDLE (MISCELLANEOUS) ×3 IMPLANT
DRAPE LAPAROSCOPIC ABDOMINAL (DRAPES) ×3 IMPLANT
DRAPE PROXIMA HALF (DRAPES) ×3 IMPLANT
DRAPE UTILITY 15X26 W/TAPE STR (DRAPE) ×6 IMPLANT
DRAPE WARM FLUID 44X44 (DRAPE) ×3 IMPLANT
DRSG OPSITE POSTOP 4X10 (GAUZE/BANDAGES/DRESSINGS) IMPLANT
DRSG OPSITE POSTOP 4X8 (GAUZE/BANDAGES/DRESSINGS) IMPLANT
ELECT BLADE 6.5 EXT (BLADE) IMPLANT
ELECT CAUTERY BLADE 6.4 (BLADE) ×3 IMPLANT
ELECT REM PT RETURN 9FT ADLT (ELECTROSURGICAL) ×3
ELECTRODE REM PT RTRN 9FT ADLT (ELECTROSURGICAL) ×1 IMPLANT
GLOVE BIO SURGEON STRL SZ 6.5 (GLOVE) ×6 IMPLANT
GLOVE BIO SURGEONS STRL SZ 6.5 (GLOVE) ×3
GLOVE BIOGEL M STRL SZ7.5 (GLOVE) ×3 IMPLANT
GLOVE BIOGEL PI IND STRL 6.5 (GLOVE) ×2 IMPLANT
GLOVE BIOGEL PI IND STRL 7.0 (GLOVE) ×1 IMPLANT
GLOVE BIOGEL PI IND STRL 8 (GLOVE) ×3 IMPLANT
GLOVE BIOGEL PI INDICATOR 6.5 (GLOVE) ×4
GLOVE BIOGEL PI INDICATOR 7.0 (GLOVE) ×2
GLOVE BIOGEL PI INDICATOR 8 (GLOVE) ×6
GOWN EXTRA PROTECTION XXL 0583 (GOWNS) ×3 IMPLANT
GOWN STRL REUS W/ TWL LRG LVL3 (GOWN DISPOSABLE) ×2 IMPLANT
GOWN STRL REUS W/ TWL XL LVL3 (GOWN DISPOSABLE) ×1 IMPLANT
GOWN STRL REUS W/TWL LRG LVL3 (GOWN DISPOSABLE) ×4
GOWN STRL REUS W/TWL XL LVL3 (GOWN DISPOSABLE) ×2
KIT BASIN OR (CUSTOM PROCEDURE TRAY) ×3 IMPLANT
KIT ROOM TURNOVER OR (KITS) ×3 IMPLANT
LIGASURE IMPACT 36 18CM CVD LR (INSTRUMENTS) IMPLANT
NS IRRIG 1000ML POUR BTL (IV SOLUTION) ×6 IMPLANT
PACK GENERAL/GYN (CUSTOM PROCEDURE TRAY) ×3 IMPLANT
PAD ARMBOARD 7.5X6 YLW CONV (MISCELLANEOUS) ×3 IMPLANT
PENCIL BUTTON HOLSTER BLD 10FT (ELECTRODE) IMPLANT
SEPRAFILM PROCEDURAL PACK 3X5 (MISCELLANEOUS) ×3 IMPLANT
SPECIMEN JAR LARGE (MISCELLANEOUS) IMPLANT
SPONGE GAUZE 4X4 12PLY (GAUZE/BANDAGES/DRESSINGS) ×3 IMPLANT
SPONGE LAP 18X18 X RAY DECT (DISPOSABLE) IMPLANT
STAPLER VISISTAT 35W (STAPLE) ×3 IMPLANT
SUCTION POOLE TIP (SUCTIONS) ×3 IMPLANT
SUT PDS AB 1 TP1 96 (SUTURE) ×6 IMPLANT
SUT SILK 2 0 SH CR/8 (SUTURE) ×3 IMPLANT
SUT SILK 2 0 TIES 10X30 (SUTURE) ×3 IMPLANT
SUT SILK 3 0 SH CR/8 (SUTURE) ×3 IMPLANT
SUT SILK 3 0 TIES 10X30 (SUTURE) ×3 IMPLANT
SUT VIC AB 3-0 SH 18 (SUTURE) IMPLANT
TAPE CLOTH SURG 4X10 WHT LF (GAUZE/BANDAGES/DRESSINGS) ×3 IMPLANT
TOWEL OR 17X26 10 PK STRL BLUE (TOWEL DISPOSABLE) ×3 IMPLANT
TRAY FOLEY CATH 16FRSI W/METER (SET/KITS/TRAYS/PACK) ×3 IMPLANT
TUBE CONNECTING 12'X1/4 (SUCTIONS)
TUBE CONNECTING 12X1/4 (SUCTIONS) IMPLANT
YANKAUER SUCT BULB TIP NO VENT (SUCTIONS) IMPLANT

## 2014-01-20 NOTE — Anesthesia Preprocedure Evaluation (Addendum)
Anesthesia Evaluation  Patient identified by MRN, date of birth, ID band Patient awake    Reviewed: Allergy & Precautions, H&P , NPO status , Patient's Chart, lab work & pertinent test results  History of Anesthesia Complications Negative for: history of anesthetic complications  Airway Mallampati: II TM Distance: >3 FB Neck ROM: Full    Dental  (+) Dental Advisory Given   Pulmonary sleep apnea (no longer uses CPAP now that has lost weight) , former smoker,  breath sounds clear to auscultation  Pulmonary exam normal       Cardiovascular - angina+ dysrhythmias Atrial Fibrillation Rhythm:Regular Rate:Tachycardia  6/15 echo: ef 55-60%, Grade 1 diastolic dysfunction, mod TR   Neuro/Psych negative neurological ROS     GI/Hepatic Neg liver ROS, GERD-  Medicated,  Endo/Other  negative endocrine ROS  Renal/GU Renal InsufficiencyRenal disease (CREAT 1.24)     Musculoskeletal   Abdominal   Peds  Hematology  (+) Blood dyscrasia (Hb 10.4, INR 1.15), anemia ,   Anesthesia Other Findings   Reproductive/Obstetrics                         Anesthesia Physical Anesthesia Plan  ASA: III and emergent  Anesthesia Plan: General   Post-op Pain Management:    Induction: Intravenous and Rapid sequence  Airway Management Planned: Oral ETT  Additional Equipment:   Intra-op Plan:   Post-operative Plan: Extubation in OR  Informed Consent:   Dental advisory given  Plan Discussed with: CRNA and Surgeon  Anesthesia Plan Comments: (Plan routine monitors, GETA)        Anesthesia Quick Evaluation

## 2014-01-20 NOTE — H&P (Signed)
Alicia Smith is an 78 y.o. female.   Chief Complaint: abd pain HPI: 78yo AAF well known to our service who has had two prior admissions for free air in abdomen (oct and earlier this month) with a negative workup. Her previous main c/o had been progressive 100lb weight loss and diarrhea over the past year. She had had normal wbc, normal lactates prior admissions. Last admission, SBFT showed no extravasation of contrast and no pain on exam. She comes in now co of upper abd pain after eating nuts. C/o epigastric/ruq pain. Normal urination. No f/c. Reports pain severe but feels better after pain meds. Reports her diarrhea had been improving iwht probiotic. +nausea no emesis.   Past Medical History  Diagnosis Date  . High cholesterol   . Sleep apnea     HX OF APNEA LOST WEIGHT AND NO LONGER   . H/O hiatal hernia   . GERD (gastroesophageal reflux disease)   . Pernicious anemia     Past Surgical History  Procedure Laterality Date  . Abdominal hysterectomy  1984    Dr. Ruthann Cancer  . Cholecystectomy  [address]    [place], [place]  . Cesarean section  1959    [place]  . Esophagogastroduodenoscopy Left 05/17/2013    Procedure: ESOPHAGOGASTRODUODENOSCOPY (EGD);  Surgeon: Arta Silence, MD;  Location[facility] END[facility];  Service: Endoscopy;  Laterality: Left;    Family History  Problem Relation Age of Onset  . Cancer Mother   . Cancer Father   . Cancer Sister     breast  . Diabetes Sister   . Hypertension Sister   . Cancer Brother     prostate  . Kidney disease Sister   . Heart disease Brother    Social History:  reports that she quit smoking about 31 years ago. Her smoking use included Cigarettes. She smoked 0.00 packs per day. She has quit using smokeless tobacco. She reports that she drinks alcohol. She reports that she uses illicit drugs (Marijuana).  Allergies: No Known Allergies   (Not in a hospital admission)  Results for orders placed during the hospital encounter of 01/19/14 (from the  past 48 hour(s))  COMPREHENSIVE METABOLIC PANEL     Status: Abnormal   Collection Time    01/20/14 12:18 AM      Result Value Ref Range   Sodium 142  137 - 147 mEq/L   Potassium 4.1  3.7 - 5.3 mEq/L   Chloride 101  96 - 112 mEq/L   CO2 21  19 - 32 mEq/L   Glucose, Bld 110 (*) 70 - 99 mg/dL   BUN 22  6 - 23 mg/dL   Creatinine, Ser 1.24 (*) 0.50 - 1.10 mg/dL   Calcium 9.4  8.4 - 10.5 mg/dL   Total Protein 7.4  6.0 - 8.3 g/dL   Albumin 3.7  3.5 - 5.2 g/dL   AST 18  0 - 37 U/L   ALT 9  0 - 35 U/L   Alkaline Phosphatase 89  39 - 117 U/L   Total Bilirubin <0.2 (*) 0.3 - 1.2 mg/dL   GFR calc non Af Amer 41 (*) >90 mL/min   GFR calc Af Amer 48 (*) >90 mL/min   Comment: (NOTE)     The eGFR has been calculated using the CKD EPI equation.     This calculation has not been validated in all clinical situations.     eGFR's persistently <90 mL/min signify possible Chronic Kidney     Disease.  LIPASE, BLOOD     Status: Abnormal   Collection Time    01/20/14 12:18 AM      Result Value Ref Range   Lipase 101 (*) 11 - 59 U/L  MAGNESIUM     Status: None   Collection Time    01/20/14 12:18 AM      Result Value Ref Range   Magnesium 1.8  1.5 - 2.5 mg/dL  I-STAT TROPOININ, ED     Status: None   Collection Time    01/20/14 12:27 AM      Result Value Ref Range   Troponin i, poc 0.01  0.00 - 0.08 ng/mL   Comment 3            Comment: Due to the release kinetics of cTnI,     a negative result within the first hours     of the onset of symptoms does not rule out     myocardial infarction with certainty.     If myocardial infarction is still suspected,     repeat the test at appropriate intervals.  I-STAT CG4 LACTIC ACID, ED     Status: Abnormal   Collection Time    01/20/14 12:29 AM      Result Value Ref Range   Lactic Acid, Venous 3.46 (*) 0.5 - 2.2 mmol/L  CBC WITH DIFFERENTIAL     Status: Abnormal   Collection Time    01/20/14  1:20 AM      Result Value Ref Range   WBC 9.7  4.0 - 10.5  K/uL   Comment: REPEATED TO VERIFY     WHITE COUNT CONFIRMED ON SMEAR   RBC 3.89  3.87 - 5.11 MIL/uL   Hemoglobin 10.4 (*) 12.0 - 15.0 g/dL   HCT 32.6 (*) 36.0 - 46.0 %   MCV 83.8  78.0 - 100.0 fL   MCH 26.7  26.0 - 34.0 pg   MCHC 31.9  30.0 - 36.0 g/dL   RDW 15.2  11.5 - 15.5 %   Platelets    150 - 400 K/uL   Value: PLATELET CLUMPS NOTED ON SMEAR, COUNT APPEARS ADEQUATE   Neutrophils Relative % 52  43 - 77 %   Lymphocytes Relative 39  12 - 46 %   Monocytes Relative 7  3 - 12 %   Eosinophils Relative 1  0 - 5 %   Basophils Relative 1  0 - 1 %   Neutro Abs 5.0  1.7 - 7.7 K/uL   Lymphs Abs 3.8  0.7 - 4.0 K/uL   Monocytes Absolute 0.7  0.1 - 1.0 K/uL   Eosinophils Absolute 0.1  0.0 - 0.7 K/uL   Basophils Absolute 0.1  0.0 - 0.1 K/uL   RBC Morphology TARGET CELLS     Comment: BURR CELLS   Dg Abd Acute W/chest  01/20/2014   CLINICAL DATA:  Abdominal pain and nausea.  EXAM: ACUTE ABDOMEN SERIES (ABDOMEN 2 VIEW & CHEST 1 VIEW)  COMPARISON:  CT of the abdomen and pelvis January 10, 2014, small bowel series January 11, 2014.  FINDINGS: Cardiac silhouette is mildly enlarged, mediastinal silhouette is nonsuspicious. Strandy densities in left lung base. No pleural effusions or focal consolidations. No pneumothorax. Severe degenerative changes shoulders.  Large amount of pneumoperitoneum, is seen on prior CT. Gas distended small bowel to 4.4 cm, apparent paucity of large bowel gas though limited assessment due to pneumoperitoneum. No intra-abdominal mass effect. Surgical clips in the right upper quadrant most consistent  with cholecystectomy. Soft tissue planes and included osseous structures are nonsuspicious. Stable degenerative change of the spine.  IMPRESSION: Left lung base atelectasis.  Increased, large amount of pneumoperitoneum with a paucity of large bowel gas concerning for small bowel obstruction. Unclear if this reflects worsening or recurrent hollow viscus perforation.  Findings discussed with and  reconfirmed by Dr.JULIE MANLY on6/28/2015at2:49 AM.   Electronically Signed   By: Elon Alas   On: 01/20/2014 02:50    Review of Systems  Constitutional: Positive for weight loss (100lb over 1 yr) and malaise/fatigue. Negative for fever and chills.  HENT: Negative for congestion and nosebleeds.   Eyes: Negative for double vision.  Respiratory: Negative for shortness of breath.   Cardiovascular: Negative for chest pain, palpitations, orthopnea and leg swelling.  Gastrointestinal: Positive for nausea and abdominal pain. Negative for vomiting, constipation, blood in stool and melena.  Genitourinary: Negative for dysuria, urgency and frequency.  Musculoskeletal: Negative for neck pain.  Neurological: Negative for sensory change, seizures and loss of consciousness.  Psychiatric/Behavioral: Negative for substance abuse.    Blood pressure 108/72, pulse 103, temperature 98.1 F (36.7 C), temperature source Oral, resp. rate 15, SpO2 96.00%. Physical Exam  Vitals reviewed. Constitutional: She is oriented to person, place, and time. She appears well-developed and well-nourished. No distress.  HENT:  Head: Normocephalic and atraumatic.  Right Ear: External ear normal.  Left Ear: External ear normal.  Eyes: Conjunctivae and EOM are normal.  Neck: Normal range of motion. Neck supple. No tracheal deviation present.  Cardiovascular: Regular rhythm and intact distal pulses.  Tachycardia present.   Respiratory: Effort normal and breath sounds normal.  GI: Soft. She exhibits distension. There is tenderness. There is no rebound and no guarding.  Soft, distended, tympanic. TTP throughout but no peritonitis. (pt has recvd morphine).   Musculoskeletal: She exhibits no edema and no tenderness.  Lymphadenopathy:    She has no cervical adenopathy.  Neurological: She is alert and oriented to person, place, and time.  Skin: Skin is warm and dry. She is not diaphoretic.  Psychiatric: She has a normal  mood and affect. Her behavior is normal. Judgment and thought content normal.     Assessment/Plan Abdominal pain Pneumoperitoneum with dilated loops of SB Elevated lactate Vit B12 def Anemia  This presentation is difft from prior - severe pain and elevated lactate. Given persistence of pneumoperitoneum in the setting of severe pain and elevated lactate, I feel the safest course of action is exploratory laparotomy, possible bowel resection, poss ostomy. Pt agrees.   I discussed the procedure in detail.  We discussed the risks and benefits of surgery including, but not limited to bleeding, infection (such as wound infection, abdominal abscess), injury to surrounding structures, blood clot formation, urinary retention, incisional hernia, anastomotic stricture, anastomotic leak, potential ostomy, anesthesia risks, pulmonary & cardiac complications such as pneumonia &/or heart attack, need for additional procedures, ileus, & prolonged hospitalization.   Place NG tube To OR urgently for exp lap IVF On Abx.   Leighton Ruff. Redmond Pulling, MD, FACS General, Bariatric, & Minimally Invasive Surgery [facility], [place]   [facility] M 01/20/2014, 4:35 AM

## 2014-01-20 NOTE — ED Provider Notes (Signed)
CSN: [id number]     Arrival date & time 01/19/14  2359 History   First MD Initiated Contact with Patient 01/20/14 0012     Chief Complaint  Patient presents with  . Abdominal Pain  . Nausea  . Emesis     (Consider location/radiation/quality/duration/timing/severity/associated sxs/prior Treatment) HPI This patient is a 78 year old woman with a recently diagnosis pneumoperitoneum found initially on CT abdomen and pelvis performed June 11 and confirmed of small bowel series performed several days later.  She presents with complaints of diffuse and severe abdominal pain along with nausea and a sensation of the need to vomit. Her symptoms began immediately after she ate some peanuts, approximately 2 hours ago. The patient has not vomited. She had a bowel movement shortly prior to arrival. No diarrhea or bloody stools.  She has a history of similar symptoms which prompted her to seek evaluation about one week ago. She says that it was at this time that she was diagnosed with "air in her abdomen". She denies fever. Nothing makes her pain worse or better. Her pain is aching, cramping and 10 over 10. It is nonradiating. She denies genitourinary symptoms.  Previous abdominal surgeries include cholecystectomy, hysterectomy and cesarean section  Past Medical History  Diagnosis Date  . High cholesterol   . Sleep apnea     HX OF APNEA LOST WEIGHT AND NO LONGER   . H/O hiatal hernia   . GERD (gastroesophageal reflux disease)   . Pernicious anemia    Past Surgical History  Procedure Laterality Date  . Abdominal hysterectomy  1984    Dr. Gaynell FaceMarshall  . [place]  [address]    [place]  . Ces[place] section  1959    [place]  . Esophagogastroduodenoscopy Left 05/17/2013    Procedure: ESOPHAGOGASTRODUODENOSCOPY (EGD);  Surgeon: Willis ModenaWilliam Outlaw, MD;  [facility];  Service: Endoscopy;  Laterality: Left;   Family History  Problem Relation Age of Onset  . Cancer Mother   . Cancer  Father   . Cancer Sister     breast  . Diabetes Sister   . Hypertension Sister   . Cancer Brother     prostate  . Kidney disease Sister   . Heart disease Brother    History  Substance Use Topics  . Smoking status: Former Smoker    Types: Cigarettes    Quit date: 07/26/1982  . Smokeless tobacco: Former NeurosurgeonUser  . Alcohol Use: Yes     Comment: Social   OB History   Grav Para Term Preterm Abortions TAB SAB Ect Mult Living                 Review of Systems  Ten point review of symptoms performed and is negative with the exception of symptoms noted above.   Allergies  Review of patient's allergies indicates no known allergies.  Home Medications   Prior to Admission medications   Medication Sig Start Date End Date Taking? Authorizing Provider  lansoprazole (PREVACID) 30 MG capsule Take 30 mg by mouth daily as needed (acid relfux).    Historical Provider, MD  magnesium oxide (MAG-OX) 400 (241.3 MG) MG tablet Take 1 tablet (400 mg total) by mouth 2 (two) times daily. 01/12/14   Calvert CantorSaima Rizwan, MD  Probiotic Product (PROBIOTIC DAILY PO) Take 1 capsule by mouth daily.    Historical Provider, MD  ranitidine (ZANTAC) 150 MG tablet Take 150 mg by mouth as needed for heartburn.    Historical Provider, MD  BP 101/78  Pulse 99  Temp(Src) 98.1 F (36.7 C) (Oral)  SpO2 100% Physical Exam Gen: well developed and well nourished appearing, uncomfortable and anxious appearing Head: NCAT Eyes: PERL, EOMI Nose: no epistaixis or rhinorrhea Mouth/throat: mucosa is moist and pink Neck: supple, no stridor Lungs: CTA B, respiratory rate 32 per minute, no wheezing, rhonchi or rales CV: RRR, no murmur, extremities appear well perfused.  Abd: soft, mildly and diffusely distended, diffusely tender without peritoneal signs.  Back: no ttp, no cva ttp Skin: warm and dry Ext: normal to inspection, no dependent edema Neuro: CN ii-xii grossly intact, no focal deficits Psyche; normal affect,  Anxious  affect and cooperative.   ED Course  Procedures (including critical care time) Labs Review  Results for orders placed during the hospital encounter of 01/19/14 (from the past 24 hour(s))  COMPREHENSIVE METABOLIC PANEL     Status: Abnormal   Collection Time    01/20/14 12:18 AM      Result Value Ref Range   Sodium 142  137 - 147 mEq/L   Potassium 4.1  3.7 - 5.3 mEq/L   Chloride 101  96 - 112 mEq/L   CO2 21  19 - 32 mEq/L   Glucose, Bld 110 (*) 70 - 99 mg/dL   BUN 22  6 - 23 mg/dL   Creatinine, Ser 4.09 (*) 0.50 - 1.10 mg/dL   Calcium 9.4  8.4 - 81.1 mg/dL   Total Protein 7.4  6.0 - 8.3 g/dL   Albumin 3.7  3.5 - 5.2 g/dL   AST 18  0 - 37 U/L   ALT 9  0 - 35 U/L   Alkaline Phosphatase 89  39 - 117 U/L   Total Bilirubin <0.2 (*) 0.3 - 1.2 mg/dL   GFR calc non Af Amer 41 (*) >90 mL/min   GFR calc Af Amer 48 (*) >90 mL/min  LIPASE, BLOOD     Status: Abnormal   Collection Time    01/20/14 12:18 AM      Result Value Ref Range   Lipase 101 (*) 11 - 59 U/L  MAGNESIUM     Status: None   Collection Time    01/20/14 12:18 AM      Result Value Ref Range   Magnesium 1.8  1.5 - 2.5 mg/dL  I-STAT TROPOININ, ED     Status: None   Collection Time    01/20/14 12:27 AM      Result Value Ref Range   Troponin i, poc 0.01  0.00 - 0.08 ng/mL   Comment 3           I-STAT CG4 LACTIC ACID, ED     Status: Abnormal   Collection Time    01/20/14 12:29 AM      Result Value Ref Range   Lactic Acid, Venous 3.46 (*) 0.5 - 2.2 mmol/L  CBC WITH DIFFERENTIAL     Status: Abnormal   Collection Time    01/20/14  1:20 AM      Result Value Ref Range   WBC 9.7  4.0 - 10.5 K/uL   RBC 3.89  3.87 - 5.11 MIL/uL   Hemoglobin 10.4 (*) 12.0 - 15.0 g/dL   HCT 91.4 (*) 78.2 - 95.6 %   MCV 83.8  78.0 - 100.0 fL   MCH 26.7  26.0 - 34.0 pg   MCHC 31.9  30.0 - 36.0 g/dL   RDW 21.3  08.6 - 57.8 %   Platelets  150 - 400 K/uL   Value: PLATELET CLUMPS NOTED ON SMEAR, COUNT APPEARS ADEQUATE   Neutrophils Relative  % 52  43 - 77 %   Lymphocytes Relative 39  12 - 46 %   Monocytes Relative 7  3 - 12 %   Eosinophils Relative 1  0 - 5 %   Basophils Relative 1  0 - 1 %   Neutro Abs 5.0  1.7 - 7.7 K/uL   Lymphs Abs 3.8  0.7 - 4.0 K/uL   Monocytes Absolute 0.7  0.1 - 1.0 K/uL   Eosinophils Absolute 0.1  0.0 - 0.7 K/uL   Basophils Absolute 0.1  0.0 - 0.1 K/uL   RBC Morphology TARGET CELLS     EKG: nsr, no acute ischemic changes, normal intervals, normal axis, normal qrs complex  CRITICAL CARE Performed by: Brandt LoosenManly, Julie   Total critical care time: 5967m  Critical care time was exclusive of separately billable procedures and treating other patients.  Critical care was necessary to treat or prevent imminent or life-threatening deterioration.  Critical care was time spent personally by me on the following activities: development of treatment plan with patient and/or surrogate as well as nursing, discussions with consultants, evaluation of patient's response to treatment, examination of patient, obtaining history from patient or surrogate, ordering and performing treatments and interventions, ordering and review of laboratory studies, ordering and review of radiographic studies, pulse oximetry and re-evaluation of patient's condition.    MDM   DDX: SBO, bowel perforation. Mesenteric ischemia is unlikely given the patent great vessels seen on January 03, 2014 contrasted CT of the abdomen and pelvis. We are managing symptomatically. We will obtain lactic acid level, usual abdominal labs, ekg, troponin and repeat CT scan. Anticipate surgical consultation.   0102:  Case discussed with Dr. Andrey CampanileWilson who is in OR with a level I case. He requests continued resuscitation and symptomatic management along with repeat CT scan. We will perform scan and he will consult when available.   0230: Radiology call reports significant increase in amount of pneumperitoneum compared to previous study. Case discussed again with Dr.  Andrey CampanileWilson. He is finishing his current case and will see and evaluate patient in the ED once available. We are treating with empiric broad spectrum abx and will continue to fluid resuscitate.   Brandt LoosenJulie Manly, MD 01/22/14 [phone]

## 2014-01-20 NOTE — Op Note (Signed)
NAMMilagros Reap:  DERR-PENN, Tanzie              ACCOUNT NO.:  [id number]  MEDICAL RECORD NO.:  [id number]  LOCATION:  OT[facility]                         [facility]  PHYSICIAN:  Shea Sellaric M. Andrey CampanileWilson, MD, FACSDATE OF BIRTH:  12-31-1935  DATE OF PROCEDURE:  01/20/2014 DATE OF DISCHARGE:                              OPERATIVE REPORT   PREOPERATIVE DIAGNOSIS:  Pneumoperitoneum.  POSTOPERATIVE DIAGNOSIS:  Pneumoperitoneum.  PROCEDURE:  Exploratory laparotomy.  SURGEON:  Peta Sellaric M. Andrey CampanileWilson, MD.  ASSISTANT SURGEON:  Romie LeveeAlicia Thomas, MD  ANESTHESIA:  General.  ESTIMATED BLOOD LOSS:  Minimal.  INDICATIONS FOR PROCEDURE:  This is a interesting 78 year old African female who actually presented back in the fall of 2014 with pneumoperitoneum and some mild abdominal pain; otherwise, she had no suggestive signs of bowel perforation.  She had no white count, elevated lactate, fever, or tachycardia.  She was medically observed and started on diet without any untoward changes in her vital signs or exam and was discharged to home.  She re-presented earlier this month in June with some mild abdominal pain, re-imaged and found to have pneumoperitoneum and was readmitted.  Her main complaints again were primarily unexplained weight loss of over a 100 pounds over the past year and persistent loose stool.  She states that she has never really had lots of abdominal pain other than some bloating in her stomach area.  She had normal white count, normal lactate, was nontender.  We got an upper GI with small-bowel follow-through that demonstrated no extravasation of contrast, just a small bowel dilatation.  She was started on a diet and tolerated without any change in her vital signs on discharge.  She re- presented earlier this evening with acute abdominal pain which she describes as severe after eating some nuts.  She said this is the worst pain and nothing like she had before.  This time, she did have an elevated lactate  but normal white blood cell count.  She had again large amounts of free air on a plain film, and she was tender on today's exam. Because of the elevated lactate, her complaints of severe pain unlike prior episodes, and her abdominal exam tenderness, I recommended exploratory laparotomy.  We discussed the risks and benefits of surgery including but not limited to bleeding, infection, injury to surrounding structures, negative exploratory surgery, possible bowel resection with the associated risk of that being leakage or stricture, incisional hernia, wound complications, blood clot formation, anesthesia complications, ileus, and failure to diagnose.  She elected to proceed to surgery.  PROCEDURE IN DETAIL:  [facility], placed supine on the operating table.  General endotracheal anesthesia was established.  A surgical time-out was performed.  A Foley catheter had been placed.  She received antibiotics in the ER.  Her abdomen was prepped and draped in usual standard surgical fashion.  Initially, a mini midline incision was made with a 10 blade, carried through the subcutaneous tissue.  The fascia was grasped and lifted up and the abdominal cavity was entered.  Her small bowel was grossly mildly dilated.  There was a large amount of simple ascites which had a pink tinge  to it in her abdomen.  Some was collected for cytology.  She had what appeared to be a very thin small bowel mesentery in the sense of what you would see after a gastric bypass; very little mesentericfat, a very long droopy mesentery.  She had a very long mesenteric vail.  I ran her small bowel from the ligament of Treitz to the terminal ileum twice.  She just had a symmetrically  Dilated small bowel without any evidence of injury.  There was no sign of perforation.  There was no enteric contents within the abdomen.  Right colon and ascending colon was visualized.  The lesser  sac was identified, and there was no staining within it.  The entire stomach was within normal limits.  She did have, on the superior half of the spleen on the lateral aspect, it was white for lack of better description.  It did not appear to have a palpable mass within the spleen, but a 1/3rd of the spleen essentially had this thick white part to it for lack of better description.  The transverse colon, splenic flexure, descending colon were visualized.  In her distal sigmoid and rectum, she had extensive diverticular disease without any evidence of diverticulitis. I re-ran her small bowel again finding no evidence of abnormality.  Dr. Maisie Fushomas had already joined me in the operating room.  Another set of eyes and we did not see any evidence of bowel compromise or any evidence of perforation.  The abdomen was copiously irrigated.  The abdominal contents were returned to the abdomen.  The body wall retractor was removed.  Seprafilm was placed in the midline of the abdomen and off to the sides.  The fascia was reapproximated with #1 looped PDS from above and from below.  Prior to tying down the fascial closures, I did do a finger sweep and felt nothing before my closure.  However, after tying the sutures, she did have a noticeable lump just to the left underneath her umbilicus.  Subcutaneous tissue was irrigated.  Skin was reapproximated.  Skin staples and a dry dressing were applied.  All needle, instrument, sponge counts were correct x2.  There were no immediate complications.  The patient tolerated the procedure well.     Denisa SellaEric M. Andrey CampanileWilson, MD, FACS     EMW/MEDQ  D:  01/20/2014  T:  01/20/2014  Job:  [id number]

## 2014-01-20 NOTE — ED Notes (Signed)
Surgeon at bedside.  

## 2014-01-20 NOTE — Procedures (Signed)
Extubation Procedure Note  Patient Details:   Name: Alicia Smith DOB: [birth year] MRN: [id number]   Airway Documentation:     Evaluation  O2 sats: stable throughout Complications: No apparent complications Patient did tolerate procedure well. Bilateral Breath Sounds: Rhonchi;Other (Comment) (coarse)   Yes  Pt extubated by MD, placed on 5L simple mask. No dyspnea or stridor noted at time of RT assessment. RT will continue to monitor.   Armando GangMike, Carrie C 01/20/2014, 9:21 AM

## 2014-01-20 NOTE — Brief Op Note (Signed)
01/19/2014 - 01/20/2014  8:40 AM  PATIENT:  Alicia Smith  78 y.o. female  PRE-OPERATIVE DIAGNOSIS:  pneuomperitoneum  POST-OPERATIVE DIAGNOSIS:  same  PROCEDURE:  Procedure(s): EXPLORATORY LAPAROTOMY   SURGEON:  Surgeon(s) and Role:    * Atilano InaEric M Traycen Goyer, MD - Primary    * Romie LeveeAlicia Thomas, MD - Assisting  PHYSICIAN ASSISTANT: none  ASSISTANTS: see above   ANESTHESIA:   general  EBL:  Total I/O In: 1600 [I.V.:1600] Out: 350 [Urine:300; Blood:50]  BLOOD ADMINISTERED:none  DRAINS: Nasogastric Tube and Urinary Catheter (Foley)   LOCAL MEDICATIONS USED:  NONE  SPECIMEN:  Source of Specimen:  peritoneal fluid  DISPOSITION OF SPECIMEN:  cytology  COUNTS:  YES  TOURNIQUET:  * No tourniquets in log *  DICTATION: .Other Dictation: Dictation Number [id number]  PLAN OF CARE: Admit to inpatient   PATIENT DISPOSITION:  pacu intubated but stable   Delay start of Pharmacological VTE agent (>24hrs) due to surgical blood loss or risk of bleeding: no  Orlandria SellaEric M. Andrey CampanileWilson, MD, FACS General, Bariatric, & Minimally Invasive Surgery [facility] [place]

## 2014-01-20 NOTE — ED Notes (Signed)
Pt reports abd pain, nausea, vomiting and loose stools that started about an hour and a half after eating some peanuts. Pt states she began sweating profusely and had abd pain. She took zantac because she states it helped her before. Pt refused zofran en route stating that she would feel better if she just vomited.

## 2014-01-20 NOTE — Progress Notes (Signed)
Received call from PACU RN to place pt on PSV. RT unavailable at called time due to administering neb tx on 6N. When available, went to PACU, passed MD on the way. Advised RT that pt tolerated PSV fine, and was already extubated. Pt currently on 5L simple mask. No dyspnea or stridor noted at RT assessment. Vent removed from PACU, RT Will continue to monitor.

## 2014-01-20 NOTE — Anesthesia Postprocedure Evaluation (Signed)
  Anesthesia Post-op Note  Patient: Alicia Smith  Procedure(s) Performed: Procedure(s): EXPLORATORY LAPAROTOMY POSSIBLE BOWEL RESECTION POSSIBLE COLOSTOMY (N/A)  Patient Location: PACU  Anesthesia Type:General  Level of Consciousness: awake and alert   Airway and Oxygen Therapy: Patient Spontanous Breathing  Post-op Pain: mild  Post-op Assessment: Post-op Vital signs reviewed, Patient's Cardiovascular Status Stable and Respiratory Function Stable  Post-op Vital Signs: Reviewed  Filed Vitals:   01/20/14 0951  BP: 100/72  Pulse: 77  Temp:   Resp: 13    Complications: No apparent anesthesia complications

## 2014-01-20 NOTE — ED Notes (Signed)
Pt in OR waiting, room 36

## 2014-01-20 NOTE — Transfer of Care (Signed)
Immediate Anesthesia Transfer of Care Note  Patient: Alicia Smith  Procedure(s) Performed: Procedure(s): EXPLORATORY LAPAROTOMY POSSIBLE BOWEL RESECTION POSSIBLE COLOSTOMY (N/A)  Patient Location: PACU  Anesthesia Type:General  Level of Consciousness: Patient remains intubated per anesthesia plan  Airway & Oxygen Therapy: Patient remains intubated per anesthesia plan and Patient placed on Ventilator (see vital sign flow sheet for setting)  Post-op Assessment: Report given to PACU RN  Post vital signs: Reviewed and stable  Complications: No apparent anesthesia complications

## 2014-01-20 NOTE — Anesthesia Procedure Notes (Signed)
Procedure Name: Intubation Date/Time: 01/20/2014 5:26 AM Performed by: Wray KearnsFOLEY, ROGER A Pre-anesthesia Checklist: Patient identified, Timeout performed, Emergency Drugs available, Patient being monitored and Suction available Patient Re-evaluated:Patient Re-evaluated prior to inductionOxygen Delivery Method: Circle system utilized Preoxygenation: Pre-oxygenation with 100% oxygen Intubation Type: IV induction, Rapid sequence and Cricoid Pressure applied Laryngoscope Size: Mac and 4 Grade View: Grade I Tube type: Oral Tube size: 7.5 mm Number of attempts: 1 Airway Equipment and Method: Stylet Placement Confirmation: ETT inserted through vocal cords under direct vision,  breath sounds checked- equal and bilateral and positive ETCO2 Secured at: 22 cm Tube secured with: Tape Dental Injury: Teeth and Oropharynx as per pre-operative assessment

## 2014-01-21 LAB — CBC
HCT: 33.2 % — ABNORMAL LOW (ref 36.0–46.0)
HEMOGLOBIN: 10.5 g/dL — AB (ref 12.0–15.0)
MCH: 26.8 pg (ref 26.0–34.0)
MCHC: 31.6 g/dL (ref 30.0–36.0)
MCV: 84.7 fL (ref 78.0–100.0)
Platelets: 217 10*3/uL (ref 150–400)
RBC: 3.92 MIL/uL (ref 3.87–5.11)
RDW: 15.5 % (ref 11.5–15.5)
WBC: 7.3 10*3/uL (ref 4.0–10.5)

## 2014-01-21 LAB — BASIC METABOLIC PANEL
BUN: 16 mg/dL (ref 6–23)
CHLORIDE: 107 meq/L (ref 96–112)
CO2: 18 meq/L — AB (ref 19–32)
Calcium: 8.2 mg/dL — ABNORMAL LOW (ref 8.4–10.5)
Creatinine, Ser: 1.35 mg/dL — ABNORMAL HIGH (ref 0.50–1.10)
GFR calc Af Amer: 43 mL/min — ABNORMAL LOW (ref >90)
GFR calc non Af Amer: 37 mL/min — ABNORMAL LOW (ref >90)
GLUCOSE: 117 mg/dL — AB (ref 70–99)
POTASSIUM: 5 meq/L (ref 3.7–5.3)
SODIUM: 138 meq/L (ref 137–147)

## 2014-01-21 NOTE — Progress Notes (Signed)
PT Cancellation Note  Patient Details Name: Alicia Smith MRN: [id number] DOB: 1935/11/21   Cancelled Treatment:    Reason Eval/Treat Not Completed: Patient declined, no reason specified, pt stated that she does not want to get up now and would prefer to work in the morning. PT will try to accomodate this.   Maness, [place] 01/21/2014, 4:15 PM

## 2014-01-21 NOTE — Progress Notes (Signed)
No flatus yet. Feels a bit better. Await return of bowel function. Patient examined and I agree with the assessment and plan  Violeta GelinasBurke Thompson, MD, MPH, FACS Trauma: [phone] General Surgery: [phone]  01/21/2014 3:07 PM

## 2014-01-21 NOTE — Progress Notes (Signed)
Patient ID: [name], female   DOB: 1936/03/29, 78 y.o.   MRN: [id number] 1 Day Post-Op  Subjective: Pt feels ok today, but sore.  Foley just removed.  Objective: Vital signs in last 24 hours: Temp:  [98.5 F (36.9 C)-99.7 F (37.6 C)] 99.7 F (37.6 C) (06/29 0913) Pulse Rate:  [82-107] 106 (06/29 0913) Resp:  [16-17] 16 (06/29 0913) BP: (96-112)/(61-74) 103/70 mmHg (06/29 0913) SpO2:  [95 %-100 %] 100 % (06/29 0913) Last BM Date: 01/18/14  Intake/Output from previous day: 06/28 0701 - 06/29 0700 In: 3356.7 [I.V.:3356.7] Out: 3125 [Urine:2900; Emesis/NG output:175; Blood:50] Intake/Output this shift: Total I/O In: 300 [I.V.:300] Out: -   PE: Abd: soft, appropriately tender, few tinkling bowel sounds, ND, incision c/d/i  Lab Results:   Recent Labs  01/20/14 0120 01/21/14 0410  WBC 9.7 7.3  HGB 10.4* 10.5*  HCT 32.6* 33.2*  PLT PLATELET CLUMPS NOTED ON SMEAR, COUNT APPEARS ADEQUATE 217   BMET  Recent Labs  01/20/14 0018 01/21/14 0410  NA 142 138  K 4.1 5.0  CL 101 107  CO2 21 18*  GLUCOSE 110* 117*  BUN 22 16  CREATININE 1.24* 1.35*  CALCIUM 9.4 8.2*   PT/INR No results found for this basename: LABPROT, INR,  in the last 72 hours CMP     Component Value Date/Time   NA 138 01/21/2014 0410   NA 141 01/03/2014 1405   K 5.0 01/21/2014 0410   CL 107 01/21/2014 0410   CO2 18* 01/21/2014 0410   GLUCOSE 117* 01/21/2014 0410   GLUCOSE 78 01/03/2014 1405   BUN 16 01/21/2014 0410   BUN 18 01/03/2014 1405   CREATININE 1.35* 01/21/2014 0410   CALCIUM 8.2* 01/21/2014 0410   PROT 7.4 01/20/2014 0018   PROT 6.2 01/03/2014 1405   ALBUMIN 3.7 01/20/2014 0018   AST 18 01/20/2014 0018   ALT 9 01/20/2014 0018   ALKPHOS 89 01/20/2014 0018   BILITOT <0.2* 01/20/2014 0018   GFRNONAA 37* 01/21/2014 0410   GFRAA 43* 01/21/2014 0410   Lipase     Component Value Date/Time   LIPASE 101* 01/20/2014 0018       Studies/Results: Dg Abd Acute W/chest  01/20/2014   CLINICAL  DATA:  Abdominal pain and nausea.  EXAM: ACUTE ABDOMEN SERIES (ABDOMEN 2 VIEW & CHEST 1 VIEW)  COMPARISON:  CT of the abdomen and pelvis January 10, 2014, small bowel series January 11, 2014.  FINDINGS: Cardiac silhouette is mildly enlarged, mediastinal silhouette is nonsuspicious. Strandy densities in left lung base. No pleural effusions or focal consolidations. No pneumothorax. Severe degenerative changes shoulders.  Large amount of pneumoperitoneum, is seen on prior CT. Gas distended small bowel to 4.4 cm, apparent paucity of large bowel gas though limited assessment due to pneumoperitoneum. No intra-abdominal mass effect. Surgical clips in the right upper quadrant most consistent with cholecystectomy. Soft tissue planes and included osseous structures are nonsuspicious. Stable degenerative change of the spine.  IMPRESSION: Left lung base atelectasis.  Increased, large amount of pneumoperitoneum with a paucity of large bowel gas concerning for small bowel obstruction. Unclear if this reflects worsening or recurrent hollow viscus perforation.  Findings discussed with and reconfirmed by Dr.JULIE MANLY on6/28/2015at2:49 AM.   Electronically Signed   By: Awilda Metroourtnay  Bloomer   On: 01/20/2014 02:50    Anti-infectives: Anti-infectives   Start     Dose/Rate Route Frequency Ordered Stop   01/20/14 0315  Ampicillin-Sulbactam (UNASYN) 3 g in sodium chloride 0.9 % 100  mL IVPB     3 g 100 mL/hr over 60 Minutes Intravenous  Once 01/20/14 0300 01/20/14 0520       Assessment/Plan  1. POD 1, s/p exploratory laparotomy for pneumoperitoneum, negative lap 2. Mild elevation in Cr  Plan: 1. Continue NGT and await better bowel function. 2. Follow Creatinine 3. Patient lives by herself. Will get PT/OT to evaluate and mobilize   LOS: 2 days    OSBORNE,KELLY E 01/21/2014, 10:33 AM Pager: [phone]

## 2014-01-21 NOTE — Progress Notes (Signed)
Foley catheter removed at this time, pt tolerated well.

## 2014-01-22 ENCOUNTER — Encounter (HOSPITAL_COMMUNITY): Payer: Self-pay | Admitting: General Surgery

## 2014-01-22 DIAGNOSIS — N179 Acute kidney failure, unspecified: Secondary | ICD-10-CM | POA: Clinically undetermined

## 2014-01-22 LAB — BASIC METABOLIC PANEL
BUN: 15 mg/dL (ref 6–23)
CHLORIDE: 104 meq/L (ref 96–112)
CO2: 22 meq/L (ref 19–32)
Calcium: 8.2 mg/dL — ABNORMAL LOW (ref 8.4–10.5)
Creatinine, Ser: 1.28 mg/dL — ABNORMAL HIGH (ref 0.50–1.10)
GFR calc Af Amer: 46 mL/min — ABNORMAL LOW (ref >90)
GFR calc non Af Amer: 40 mL/min — ABNORMAL LOW (ref >90)
GLUCOSE: 102 mg/dL — AB (ref 70–99)
Potassium: 4.8 mEq/L (ref 3.7–5.3)
Sodium: 136 mEq/L — ABNORMAL LOW (ref 137–147)

## 2014-01-22 MED ORDER — DEXTROSE-NACL 5-0.9 % IV SOLN
INTRAVENOUS | Status: DC
  Administered 2014-01-22 – 2014-01-25 (×7): via INTRAVENOUS

## 2014-01-22 NOTE — Progress Notes (Signed)
Patient ID: Alicia Smith, female   DOB: [birth year], 78 y.o.   MRN: [id number]   Subjective: Didn't feel like  Getting OOB yesterday.  Sore.  No flatus yet.  NGT 365m/24h bilious.  390 UOP recorded.  sCr slightly up today.   Objective:  Vital signs:  Filed Vitals:   01/21/14 1305 01/21/14 1742 01/21/14 2200 01/22/14 0519  BP: 112/70 104/76 111/72 106/71  Pulse: 105 107 102 99  Temp: 98.5 F (36.9 C) 99.3 F (37.4 C) 98.9 F (37.2 C) 97.9 F (36.6 C)  TempSrc: Oral Oral Oral Oral  Resp: _0 Height:      Weight:      SpO2: 97% 100% 100% 99%    Last BM Date: 01/18/14  Intake/Output   Yesterday:  06/29 0701 - 06/30 0700 In: 300 [I.V.:300] Out: 690 [Urine:390; Emesis/NG output:300] This shift:    I/O last 3 completed shifts: In: 1808.3 [I.V.:1808.3] Out: 1915 [[BCWUG:8916 Emesis/NG output:325]    Physical Exam: General: Pt awake/alert/oriented x4 in no acute distress Chest: cta. No chest wall pain w good excursion CV:  Pulses intact.  Regular rhythm MS: Normal AROM mjr joints.  No obvious deformity Abdomen: Soft.  Nondistended.  Bowel sounds are present.  Mild generalized tenderness without peritoneal signs.  Midline dressing removed, staples in place. Ext:  SCDs BLE.  No mjr edema.  No cyanosis Skin: No petechiae / purpura   Problem List:   Active Problems:   Pneumoperitoneum    Results:   Labs: Results for orders placed during the hospital encounter of 01/19/14 (from the past 48 hour(s))  CBC     Status: Abnormal   Collection Time    01/21/14  4:10 AM      Result Value Ref Range   WBC 7.3  4.0 - 10.5 K/uL   RBC 3.92  3.87 - 5.11 MIL/uL   Hemoglobin 10.5 (*) 12.0 - 15.0 g/dL   HCT 33.2 (*) 36.0 - 46.0 %   MCV 84.7  78.0 - 100.0 fL   MCH 26.8  26.0 - 34.0 pg   MCHC 31.6  30.0 - 36.0 g/dL   RDW 15.5  11.5 - 15.5 %   Platelets 217  150 - 400 K/uL  BASIC METABOLIC PANEL     Status: Abnormal   Collection Time    01/21/14  4:10 AM   Result Value Ref Range   Sodium 138  137 - 147 mEq/L   Potassium 5.0  3.7 - 5.3 mEq/L   Comment: DELTA CHECK NOTED     NO VISIBLE HEMOLYSIS   Chloride 107  96 - 112 mEq/L   CO2 18 (*) 19 - 32 mEq/L   Glucose, Bld 117 (*) 70 - 99 mg/dL   BUN 16  6 - 23 mg/dL   Creatinine, Ser 1.35 (*) 0.50 - 1.10 mg/dL   Calcium 8.2 (*) 8.4 - 10.5 mg/dL   GFR calc non Af Amer 37 (*) >90 mL/min   GFR calc Af Amer 43 (*) >90 mL/min   Comment: (NOTE)     The eGFR has been calculated using the CKD EPI equation.     This calculation has not been validated in all clinical situations.     eGFR's persistently <90 mL/min signify possible Chronic Kidney     Disease.    Imaging / Studies: No results found.  Scheduled Meds: . antiseptic oral rinse  15 mL Mouth Rinse BID  . enoxaparin (LOVENOX) injection  40 mg  Subcutaneous Q24H  . pantoprazole (PROTONIX) IV  40 mg Intravenous Q24H   Continuous Infusions: . dextrose 5 % and 0.45 % NaCl with KCl 20 mEq/L 100 mL/hr at 01/22/14 0435   PRN Meds:.morphine injection, ondansetron (ZOFRAN) IV, ondansetron   Antibiotics: Anti-infectives   Start     Dose/Rate Route Frequency Ordered Stop   01/20/14 0315  Ampicillin-Sulbactam (UNASYN) 3 g in sodium chloride 0.9 % 100 mL IVPB     3 g 100 mL/hr over 60 Minutes Intravenous  Once 01/20/14 0300 01/20/14 0520      Assessment/Plan  POD 2, s/p exploratory laparotomy for pneumoperitoneum, negative lap  -continue NGT and await bowel function -mobilize, PT/OT eval -IS([phone] on IS, needs reinforcement) -SCD/lovenox Acute renal insufficiency  -DC k from IVF and change to D5NS -monitor intake and output -avoid nephrotoxins such as NSAIDs -repeat BMP in AM  Erby Pian, [facility] Surgery Pager [phone] Office [phone]  01/22/2014 8:26 AM

## 2014-01-22 NOTE — Evaluation (Signed)
Physical Therapy Evaluation Patient Details Name: Alicia Smith MRN: [id number] DOB: [birth year] Today's Date: 01/22/2014   History of Present Illness  78yo AAF admitted 6/27 with pneumoperitoneaum. Pt underwent an ex lap 01/20/14.  Clinical Impression  Pt tolerating OOB mobility well for first time up OOB. Pt adamant about returning home and suspect pt with progress well enough but would need initial 24/7 supervision. Acute PT to follow to progress independence with mobility.    Follow Up Recommendations Supervision/Assistance - 24 hour;No PT follow up    Equipment Recommendations   (shower chair)    Recommendations for Other Services       Precautions / Restrictions Precautions Precautions: Fall Restrictions Weight Bearing Restrictions: No      Mobility  Bed Mobility Overal bed mobility: Needs Assistance Bed Mobility: Rolling;Sidelying to Sit Rolling: Supervision Sidelying to sit: Supervision       General bed mobility comments: pt adamant about using bed rail despite education on not having that avail to her at home. pt reports "I won't need it by the time i leave"  Transfers Overall transfer level: Needs assistance Equipment used: Rolling walker (2 wheeled) Transfers: Sit to/from Stand Sit to Stand: Min assist         General transfer comment: v/c's for hand placement, increased time  Ambulation/Gait Ambulation/Gait assistance: Min guard Ambulation Distance (Feet): 150 Feet Assistive device: Rolling walker (2 wheeled) Gait Pattern/deviations: Step-through pattern;Decreased stride length;Trunk flexed Gait velocity: typical for abdominal surgery   General Gait Details: no episodes of LOB, good walker management, v/c's for optimal walker management during turning to sit in chair  Stairs            Wheelchair Mobility    Modified Rankin (Stroke Patients Only)       Balance                                              Pertinent Vitals/Pain Abdominal soreness    Home Living Family/patient expects to be discharged to:: Private residence Living Arrangements: Alone Available Help at Discharge: Family;Available PRN/intermittently Type of Home: House Home Access: Stairs to enter Entrance Stairs-Rails: None Entrance Stairs-Number of Steps: 1 Home Layout: One level Home Equipment: Environmental consultantWalker - 2 wheels Additional Comments: has a tub shower    Prior Function Level of Independence: Independent         Comments: pt was driving     Hand Dominance   Dominant Hand: Right    Extremity/Trunk Assessment   Upper Extremity Assessment: Overall WFL for tasks assessed           Lower Extremity Assessment: Defer to PT evaluation      Cervical / Trunk Assessment:  (abdominal incision)  Communication   Communication: No difficulties  Cognition Arousal/Alertness: Awake/alert Behavior During Therapy: WFL for tasks assessed/performed Overall Cognitive Status: Within Functional Limits for tasks assessed                      General Comments General comments (skin integrity, edema, etc.): pt assist to [facility] with minA without RW, pt indep with hygiene s/p urination    Exercises        Assessment/Plan    PT Assessment Patient needs continued PT services  PT Diagnosis Difficulty walking;Generalized weakness   PT Problem List Decreased strength;Decreased activity tolerance;Decreased balance;Decreased mobility  PT Treatment Interventions DME  instruction;Gait training;Stair training;Functional mobility training;Therapeutic activities;Therapeutic exercise   PT Goals (Current goals can be found in the Care Plan section) Acute Rehab PT Goals Patient Stated Goal: "I want to eat" PT Goal Formulation: With patient Time For Goal Achievement: 01/29/14 Potential to Achieve Goals: Good    Frequency Min 3X/week   Barriers to discharge Decreased caregiver support lives alone    Co-evaluation                End of Session Equipment Utilized During Treatment: Gait belt Activity Tolerance: Patient tolerated treatment well Patient left: in chair;with call bell/phone within reach Nurse Communication: Mobility status         Time: [id number] PT Time Calculation (min): 27 min   Charges:   PT Evaluation $Initial PT Evaluation Tier I: 1 Procedure PT Treatments $Gait Training: 8-22 mins   PT G CodesMarcene Brawn:          Chadwell, Ashly Marie 01/22/2014, 9:30 AM  Lewis ShockAshly Chadwell, PT, DPT Pager #: [phone] Office #: [phone]

## 2014-01-22 NOTE — Progress Notes (Signed)
Up in chair. Walked some with PT. No flatus. Continue NGT. Patient examined and I agree with the assessment and plan  Violeta GelinasBurke Thompson, MD, MPH, FACS Trauma: [phone] General Surgery: [phone]  01/22/2014 9:13 AM

## 2014-01-22 NOTE — Evaluation (Signed)
Occupational Therapy Evaluation Patient Details Name: Alicia Smith MRN: [id number] DOB: 1935/09/21 Today's Date: 01/22/2014    History of Present Illness 78yo AAF admitted 6/27 with pneumoperitoneaum. Pt underwent an ex lap 01/20/14.   Clinical Impression   Pt admitted with above. She demonstrates the below listed deficits and will benefit from continued OT to maximize safety and independence with BADLs.  Pt is limited this date by new onset Rt knee pain (5/10), and abdominal pain 4/10 - RN notified.  She requires mod A for BADLs.  Anticipate she will progress well once pain levels decrease.  She is adamant that she plans to return home alone at discharge.  [facility].       Follow Up Recommendations  Home health OT;Supervision/Assistance - 24 hour    Equipment Recommendations  3 in 1 bedside comode;Tub/shower seat    Recommendations for Other Services       Precautions / Restrictions Precautions Precautions: Fall Restrictions Weight Bearing Restrictions: No      Mobility Bed Mobility Overal bed mobility: Needs Assistance Bed Mobility: Rolling;Sidelying to Sit;Sit to Supine Rolling: Supervision Sidelying to sit: Supervision   Sit to supine: HOB elevated;Min assist   General bed mobility comments: Pt is dependent on use of bedrails and adjusting bed to allow for easier access.  Pt required assist to lift LEs into bed  Transfers Overall transfer level: Needs assistance Equipment used: Rolling walker (2 wheeled) Transfers: Sit to/from UGI CorporationStand;Stand Pivot Transfers Sit to Stand: Min assist Stand pivot transfers: Min assist       General transfer comment: v/c's for hand placement, increased time    Balance Overall balance assessment: Needs assistance Sitting-balance support: Feet supported Sitting balance-Leahy Scale: Good     Standing balance support: Single extremity supported Standing balance-Leahy Scale: Poor                               ADL Overall ADL's : Needs assistance/impaired Eating/Feeding: NPO   Grooming: Wash/dry hands;Wash/dry face;Oral care;Brushing hair;Set up;Sitting   Upper Body Bathing: Set up;Sitting   Lower Body Bathing: Moderate assistance;Sit to/from stand   Upper Body Dressing : Set up;Sitting   Lower Body Dressing: Moderate assistance;Sit to/from stand Lower Body Dressing Details (indicate cue type and reason): Pt is able to cross ankles over knees to access heel of foot, pain limits access to toes - unable to don socks  Toilet Transfer: Minimal assistance;Ambulation;Stand-pivot;BSC Toilet Transfer Details (indicate cue type and reason): Pt complaining of new onset Rt knee pain which limits her mobility Toileting- Clothing Manipulation and Hygiene: Minimal assistance;Sit to/from stand       Functional mobility during ADLs: Minimal assistance;Rolling walker General ADL Comments: Rt. knee pain limiting functional mobility and ability to perform BADLs     Vision                     Perception     Praxis      Pertinent Vitals/Pain See vitals flow sheet     Hand Dominance Right   Extremity/Trunk Assessment Upper Extremity Assessment Upper Extremity Assessment: Overall WFL for tasks assessed   Lower Extremity Assessment Lower Extremity Assessment: Defer to PT evaluation       Communication Communication Communication: No difficulties   Cognition Arousal/Alertness: Awake/alert Behavior During Therapy: WFL for tasks assessed/performed Overall Cognitive Status: Within Functional Limits for tasks assessed  General Comments       Exercises       Shoulder Instructions      Home Living Family/patient expects to be discharged to:: Private residence Living Arrangements: Alone Available Help at Discharge: Family;Available PRN/intermittently Type of Home: House Home Access: Stairs to enter ITT IndustriesEntrance  Stairs-Number of Steps: 1 Entrance Stairs-Rails: None Home Layout: One level     Bathroom Shower/Tub: Tub/shower unit;Curtain Shower/tub characteristics: Engineer, building servicesCurtain Bathroom Toilet: Standard     Home Equipment: Environmental consultantWalker - 2 wheels   Additional Comments: has a tub shower      Prior Functioning/Environment Level of Independence: Independent        Comments: pt was driving    OT Diagnosis: Generalized weakness;Acute pain   OT Problem List: Decreased strength;Decreased activity tolerance;Impaired balance (sitting and/or standing);Decreased knowledge of use of DME or AE;Pain   OT Treatment/Interventions: Self-care/ADL training;DME and/or AE instruction;Therapeutic activities;Patient/family education;Balance training    OT Goals(Current goals can be found in the care plan section) Acute Rehab OT Goals Patient Stated Goal: To get better OT Goal Formulation: With patient Time For Goal Achievement: 01/30/14 Potential to Achieve Goals: Good ADL Goals Pt Will Perform Grooming: with supervision;standing Pt Will Perform Lower Body Bathing: with supervision;sit to/from stand Pt Will Perform Lower Body Dressing: with supervision;sit to/from stand Pt Will Transfer to Toilet: with supervision;ambulating;regular height toilet;bedside commode;grab bars Pt Will Perform Toileting - Clothing Manipulation and hygiene: with supervision;sit to/from stand Pt Will Perform Tub/Shower Transfer: Tub transfer;with supervision;shower seat;ambulating;rolling walker  OT Frequency: Min 2X/week   Barriers to D/C: Decreased caregiver support          Co-evaluation              End of Session Equipment Utilized During Treatment: Rolling walker Nurse Communication: Patient requests pain meds;Mobility status  Activity Tolerance: Patient limited by pain Patient left: in bed;with call bell/phone within reach;with bed alarm set   Time: 1410-1438 OT Time Calculation (min): 28 min Charges:  OT General  Charges $OT Visit: 1 Procedure OT Evaluation $Initial OT Evaluation Tier I: 1 Procedure OT Treatments $Self Care/Home Management : 8-22 mins $Therapeutic Activity: 8-22 mins G-Codes:    Conarpe, Wendi M 01/22/2014, 4:43 PM

## 2014-01-22 NOTE — Clinical Documentation Improvement (Signed)
Patient with abnormal lab values.   Black female  Creatinines ranging from 1.24 to 1.35 for this admission.  GFR's ranging from 43 to 48 this admission  Please provide a diagnosis associated with the below data and render an opinion of in your next progress note and discharge summary to enhance the Severity of Illness and Risk of Mortality in the care you are providing this patient.  _______CKD Stage I - GFR > OR = 90 _______CKD Stage II - GFR 60-80 _______CKD Stage III - GFR 30-59 _______CKD Stage IV - GFR 15-29 _______CKD Stage V - GFR < 15 _______ESRD (End Stage Renal Disease) _______Other condition_____________    Jessie Foothank You, Ren Grasse T Melia Hopes ,RN Clinical Documentation Specialist:  [phone]  [facility]- Health Information Management

## 2014-01-22 NOTE — Progress Notes (Addendum)
INITIAL NUTRITION ASSESSMENT  DOCUMENTATION CODES Per approved criteria  -Non-severe (moderate) malnutrition in the context of chronic illness  Pt meets criteria for MODERATE MALNUTRITION in the context of CHRONIC as evidenced by moderate loss of subcutaneous fat, moderate muscle loss, and 13% weight loss in less than 2 months.  INTERVENTION: Diet advancement per MD RD to continue to monitor  NUTRITION DIAGNOSIS: Inadequate oral intake related to decreased appetite as evidenced by >13% weight loss and moderate muscle and fat wasting.   Goal: Pt to meet >/= 90% of their estimated nutrition needs   Monitor:  Diet advancement, PO intake, weight trend, labs  Reason for Assessment: Consult  78 y.o. female  Admitting Dx: <principal problem not specified>  ASSESSMENT: 78 yo AAF well known to our service who has had two prior admissions for free air in abdomen (oct and earlier this month) with a negative workup. Her previous main c/o had been progressive 100 lb weight loss and diarrhea over the past year. She had had normal wbc, normal lactates prior admissions. Last admission, SBFT showed no extravasation of contrast and no pain on exam. She comes in now co of upper abd pain after eating nuts. C/o epigastric/ruq pain.  RD consulted for diet education per patient request. RD reviewed patient chart. Pt currently NPO with NGT to suction, history of weight loss. Pt denies any diet education needs, she reports she requested to see a dietitian to get something to eat. Pt reports that she used to weigh 280 lbs one year ago but, after having H. Pylori her appetite decreased and she gradually lost weight. Per weight history pt has lost 25 lbs within the past 8 months with 13% weight loss in the past 2 months.   Pt reports that her appetite has been better recently and she was eating fairly well PTA. Pt reports mainly eating meat, vegetables, and potatoes. RD will monitor diet advancement and PO  intake. Encouraged pt to drink plenty of fluids, chew food well, and eat fruit daily when diet advances.   Nutrition Focused Physical Exam:  Subcutaneous Fat:  Orbital Region: mild wasting Upper Arm Region: moderate wasting Thoracic and Lumbar Region: NA  Muscle:  Temple Region: mild wasting Clavicle Bone Region: moderate wasting Clavicle and Acromion Bone Region: moderate wasting Scapular Bone Region: NA Dorsal Hand: moderate wasting Patellar Region: moderate wasting Anterior Thigh Region: moderate wasting Posterior Calf Region: moderate wasting  Edema: none   Height: Ht Readings from Last 1 Encounters:  01/20/14 5' 4.96" (1.65 m)    Weight: Wt Readings from Last 1 Encounters:  01/20/14 145 lb 11.6 oz (66.1 kg)    Ideal Body Weight: 125 lbs  % Ideal Body Weight: 116%  Wt Readings from Last 10 Encounters:  01/20/14 145 lb 11.6 oz (66.1 kg)  01/20/14 145 lb 11.6 oz (66.1 kg)  01/12/14 145 lb 12.8 oz (66.134 kg)  01/03/14 144 lb (65.318 kg)  12/03/13 167 lb (75.751 kg)  10/04/13 157 lb (71.215 kg)  06/20/13 162 lb (73.483 kg)  05/15/13 170 lb (77.111 kg)  05/15/13 170 lb (77.111 kg)  04/23/13 163 lb (73.936 kg)    Usual Body Weight: 280 lbs (per pt report)  % Usual Body Weight: 52%  BMI:  Body mass index is 24.28 kg/(m^2).  Estimated Nutritional Needs: Kcal: 1600-1800 Protein: 70-80 grams Fluid: 1.6-1.8 L/day  Skin: closed incision on abdomen  Diet Order: NPO  EDUCATION NEEDS: -No education needs identified at this time   Intake/Output Summary (  Last 24 hours) at 01/22/14 0948 Last data filed at 01/22/14 0800  Gross per 24 hour  Intake   2300 ml  Output    690 ml  Net   1610 ml    Last BM: 6/26  Labs:   Recent Labs Lab 01/20/14 0018 01/21/14 0410 01/22/14 0745  NA 142 138 136*  K 4.1 5.0 4.8  CL 101 107 104  CO2 21 18* 22  BUN 22 16 15   CREATININE 1.24* 1.35* 1.28*  CALCIUM 9.4 8.2* 8.2*  MG 1.8  --   --   GLUCOSE 110* 117*  102*    CBG (last 3)  No results found for this basename: GLUCAP,  in the last 72 hours  Scheduled Meds: . antiseptic oral rinse  15 mL Mouth Rinse BID  . enoxaparin (LOVENOX) injection  40 mg Subcutaneous Q24H  . pantoprazole (PROTONIX) IV  40 mg Intravenous Q24H    Continuous Infusions: . dextrose 5 % and 0.9% NaCl      Past Medical History  Diagnosis Date  . High cholesterol   . Sleep apnea     HX OF APNEA LOST WEIGHT AND NO LONGER   . H/O hiatal hernia   . GERD (gastroesophageal reflux disease)   . Pernicious anemia     Past Surgical History  Procedure Laterality Date  . Abdominal hysterectomy  1984    Dr. Gaynell FaceMarshall  . [place]olecyst[place]  [address]    [place]  . [place]  1959    [place]  . Esophagogastroduodenoscopy Left 05/17/2013    Procedure: ESOPHAGOGASTRODUODENOSCOPY (EGD);  Surgeon: Willis ModenaWilliam Outlaw, MD;  [facility];  Service: Endoscopy;  Laterality: Left;  . Laparotomy N/A 01/20/2014    Procedure: EXPLORATORY LAPAROTOMY POSSIBLE BOWEL RESECTION POSSIBLE COLOSTOMY;  Surgeon: Atilano InaEric M Wilson, MD;  [facility];  Service: General;  Laterality: N/A;    Ian Malkineanne Barnett RD, LDN Inpatient Clinical Dietitian Pager: [phone] After Hours Pager: [phone]

## 2014-01-23 DIAGNOSIS — E440 Moderate protein-calorie malnutrition: Secondary | ICD-10-CM | POA: Insufficient documentation

## 2014-01-23 LAB — BASIC METABOLIC PANEL
Anion gap: 13 (ref 5–15)
BUN: 13 mg/dL (ref 6–23)
CO2: 23 mEq/L (ref 19–32)
Calcium: 8.6 mg/dL (ref 8.4–10.5)
Chloride: 104 mEq/L (ref 96–112)
Creatinine, Ser: 1.24 mg/dL — ABNORMAL HIGH (ref 0.50–1.10)
GFR, EST AFRICAN AMERICAN: 48 mL/min — AB (ref >90)
GFR, EST NON AFRICAN AMERICAN: 41 mL/min — AB (ref >90)
Glucose, Bld: 100 mg/dL — ABNORMAL HIGH (ref 70–99)
POTASSIUM: 4.1 meq/L (ref 3.7–5.3)
Sodium: 140 mEq/L (ref 137–147)

## 2014-01-23 LAB — POCT I-STAT 4, (NA,K, GLUC, HGB,HCT)
GLUCOSE: 106 mg/dL — AB (ref 70–99)
HEMATOCRIT: 34 % — AB (ref 36.0–46.0)
Hemoglobin: 11.6 g/dL — ABNORMAL LOW (ref 12.0–15.0)
Potassium: 4.3 mEq/L (ref 3.7–5.3)
Sodium: 141 mEq/L (ref 137–147)

## 2014-01-23 NOTE — Progress Notes (Signed)
Await bowel function. Does have BS. C/O R posterior knee pain which has been chronic but worse today. Calf is soft. May try some oral NSAID after bowel function returns. Patient examined and I agree with the assessment and plan  Violeta GelinasBurke Kiandria Clum, MD, MPH, FACS Trauma: [phone] General Surgery: [phone]  01/23/2014 3:11 PM

## 2014-01-23 NOTE — Progress Notes (Signed)
Patient ID: Alicia Smith, female   DOB: 05/31/1936, 78 y.o.   MRN: [id number]   Subjective: Hungry.  Not passing flatus.  VSS.  570m out of NGT.  C/o knee pain, not getting OOB much  Objective:  Vital signs:  Filed Vitals:   01/22/14 0519 01/22/14 1315 01/22/14 2135 01/23/14 0549  BP: 106/71 105/74 107/68 112/64  Pulse: 99 105 82 97  Temp: 97.9 F (36.6 C) 99.3 F (37.4 C) 98.6 F (37 C) 98.5 F (36.9 C)  TempSrc: Oral Oral Oral Oral  Resp: _0 Height:      Weight:      SpO2: 99% 97% 100% 96%    Last BM Date: 01/18/14  Intake/Output   Yesterday:  06/30 0701 - 07/01 0700 In: 2900 [I.V.:2300; NG/GT:600] Out: 900 [Urine:400; Emesis/NG output:500] This shift:    I/O last 3 completed shifts: In: 2900 [I.V.:2300; NG/GT:600] Out: 1290 [Urine:540; Emesis/NG output:750]    Physical Exam:  General: Pt awake/alert/oriented x4 in no acute distress  Chest: cta. No chest wall pain w good excursion  CV: Pulses intact. Regular rhythm  MS: Normal AROM mjr joints. No obvious deformity  Abdomen: Soft. Nondistended. Bowel sounds are present. Minimal tenderness. Midline dressing removed, staples in place.  Ext: SCDs BLE. No mjr edema. No cyanosis  Skin: No petechiae / purpura   Problem List:   Active Problems:   Pneumoperitoneum   AKI (acute kidney injury)    Results:   Labs: Results for orders placed during the hospital encounter of 01/19/14 (from the past 48 hour(s))  BASIC METABOLIC PANEL     Status: Abnormal   Collection Time    01/22/14  7:45 AM      Result Value Ref Range   Sodium 136 (*) 137 - 147 mEq/L   Potassium 4.8  3.7 - 5.3 mEq/L   Chloride 104  96 - 112 mEq/L   CO2 22  19 - 32 mEq/L   Glucose, Bld 102 (*) 70 - 99 mg/dL   BUN 15  6 - 23 mg/dL   Creatinine, Ser 1.28 (*) 0.50 - 1.10 mg/dL   Calcium 8.2 (*) 8.4 - 10.5 mg/dL   GFR calc non Af Amer 40 (*) >90 mL/min   GFR calc Af Amer 46 (*) >90 mL/min   Comment: (NOTE)     The eGFR has been  calculated using the CKD EPI equation.     This calculation has not been validated in all clinical situations.     eGFR's persistently <90 mL/min signify possible Chronic Kidney     Disease.    Imaging / Studies: No results found.  Scheduled Meds: . antiseptic oral rinse  15 mL Mouth Rinse BID  . enoxaparin (LOVENOX) injection  40 mg Subcutaneous Q24H  . pantoprazole (PROTONIX) IV  40 mg Intravenous Q24H   Continuous Infusions: . dextrose 5 % and 0.9% NaCl 100 mL/hr at 01/23/14 0818   PRN Meds:.morphine injection, ondansetron (ZOFRAN) IV, ondansetron   Antibiotics: Anti-infectives   Start     Dose/Rate Route Frequency Ordered Stop   01/20/14 0315  Ampicillin-Sulbactam (UNASYN) 3 g in sodium chloride 0.9 % 100 mL IVPB     3 g 100 mL/hr over 60 Minutes Intravenous  Once 01/20/14 0300 01/20/14 0520      Assessment/Plan  POD 3, s/p exploratory laparotomy for pneumoperitoneum, negative lap  -continue NGT and await bowel function  -mobilize, PT/OT eval  -IS(1000 on IS, needs reinforcement)  -SCD/lovenox  -  will need outpatient GI follow up for further evaluation of chronic abdominal pain and weight loss(Dr. Michail Sermon) Acute renal insufficiency  -await BMP results -monitor intake and output  -avoid nephrotoxins such as NSAIDs   Erby Pian, [facility] Surgery Pager [phone] Office [phone]  01/23/2014 .8:49 AM

## 2014-01-24 NOTE — Progress Notes (Signed)
Up on BSC. Good BS. Getting up with PT today. Await return of bowel function. Patient examined and I agree with the assessment and plan  Violeta GelinasBurke Anthonie Lotito, MD, MPH, FACS Trauma: [phone] General Surgery: [phone]  01/24/2014 11:18 AM

## 2014-01-24 NOTE — Progress Notes (Signed)
I have read and agree with this note.   Cathy Cinde Ebert, OTR/L [phone]     

## 2014-01-24 NOTE — Progress Notes (Signed)
Patient ID: Alicia Smith, female   DOB: 10/25/1935, 78 y.o.   MRN: [id number]   Subjective: No flatus, BM.  No n/v.  951m out of NGT, bilious.  Sat up in chair.  Refused to walk with therapy, discussed understands this will prolong her ileus and hospitalization.    Objective:  Vital signs:  Filed Vitals:   01/23/14 0549 01/23/14 1321 01/23/14 2150 01/24/14 0546  BP: 112/64 103/69 111/72 94/69  Pulse: 97 97 87 99  Temp: 98.5 F (36.9 C) 99.9 F (37.7 C) 98.4 F (36.9 C) 98.8 F (37.1 C)  TempSrc: Oral Oral Oral Oral  Resp: _0 Height:      Weight:      SpO2: 96% 97% 95% 94%    Last BM Date: 01/18/14  Intake/Output   Yesterday:  07/01 0701 - 07/02 0700 In: 700 [I.V.:700] Out: 1225 [Urine:325; Emesis/NG output:900] This shift:    I/O last 3 completed shifts: In: 700 [I.V.:700] Out: 2125 [Urine:725; Emesis/NG output:1400]    Physical Exam:  General: Pt awake/alert/oriented x4 in no acute distress  Chest: cta. No chest wall pain w good excursion  CV: Pulses intact. Regular rhythm  MS: Normal AROM mjr joints. No obvious deformity  Abdomen: Soft. Nondistended. Bowel sounds are present. Mild generalized tenderness without peritoneal signs. Midline dressing removed, staples in place.  Ext: SCDs BLE. No mjr edema. No cyanosis  Skin: No petechiae / purpura   Problem List:   Active Problems:   Pneumoperitoneum   AKI (acute kidney injury)   Malnutrition of moderate degree    Results:   Labs: Results for orders placed during the hospital encounter of 01/19/14 (from the past 48 hour(s))  BASIC METABOLIC PANEL     Status: Abnormal   Collection Time    01/23/14 10:10 AM      Result Value Ref Range   Sodium 140  137 - 147 mEq/L   Potassium 4.1  3.7 - 5.3 mEq/L   Chloride 104  96 - 112 mEq/L   CO2 23  19 - 32 mEq/L   Glucose, Bld 100 (*) 70 - 99 mg/dL   BUN 13  6 - 23 mg/dL   Creatinine, Ser 1.24 (*) 0.50 - 1.10 mg/dL   Calcium 8.6  8.4 - 10.5 mg/dL   GFR calc non Af Amer 41 (*) >90 mL/min   GFR calc Af Amer 48 (*) >90 mL/min   Comment: (NOTE)     The eGFR has been calculated using the CKD EPI equation.     This calculation has not been validated in all clinical situations.     eGFR's persistently <90 mL/min signify possible Chronic Kidney     Disease.   Anion gap 13  5 - 15    Imaging / Studies: No results found.  Scheduled Meds: . antiseptic oral rinse  15 mL Mouth Rinse BID  . enoxaparin (LOVENOX) injection  40 mg Subcutaneous Q24H  . pantoprazole (PROTONIX) IV  40 mg Intravenous Q24H   Continuous Infusions: . dextrose 5 % and 0.9% NaCl 100 mL/hr at 01/24/14 0441   PRN Meds:.morphine injection, ondansetron (ZOFRAN) IV, ondansetron   Antibiotics: Anti-infectives   Start     Dose/Rate Route Frequency Ordered Stop   01/20/14 0315  Ampicillin-Sulbactam (UNASYN) 3 g in sodium chloride 0.9 % 100 mL IVPB     3 g 100 mL/hr over 60 Minutes Intravenous  Once 01/20/14 0300 01/20/14 0520      Assessment/Plan  POD 4, s/p exploratory laparotomy for pneumoperitoneum, negative lap---Dr. Wilson  6/28 -continue NGT and await bowel function  -mobilize, PT/OT eval  -IS(1000 on IS, needs reinforcement)  -SCD/lovenox  -will need outpatient GI follow up for further evaluation of chronic abdominal pain and weight loss(Dr. Schooler)  Acute renal insufficiency  -resolving, repeat BMP in AM -monitor intake and output  -avoid nephrotoxins such as NSAIDs   Emina Riebock, ANP-BC [facility] Pager [phone] Office [phone]  01/24/2014 7:50 AM    

## 2014-01-24 NOTE — Progress Notes (Signed)
Physical Therapy Treatment Patient Details Name: Alicia ReapMary Smith MRN: [id number] DOB: [birth year] Today's Date: 01/24/2014    History of Present Illness 78yo AAF admitted 6/27 with pneumoperitoneaum. Pt underwent an ex lap 01/20/14.    PT Comments    Patient progressing well with ambulation this session. Stated that leg was feeling much better this morning. Continue with ambulation and recommend walking with staff  Follow Up Recommendations        Equipment Recommendations       Recommendations for Other Services       Precautions / Restrictions Precautions Precautions: Fall    Mobility  Bed Mobility Overal bed mobility: Needs Assistance Bed Mobility: Sit to Supine       Sit to supine: Supervision      Transfers Overall transfer level: Needs assistance Equipment used: Rolling walker (2 wheeled) Transfers: Sit to/from Stand Sit to Stand: Min guard         General transfer comment: v/c's for hand placement, increased time  Ambulation/Gait Ambulation/Gait assistance: Min guard Ambulation Distance (Feet): 200 Feet Assistive device: Rolling walker (2 wheeled) Gait Pattern/deviations: Step-to pattern;Decreased step length - right;Decreased step length - left Gait velocity: typical for abdominal surgery   General Gait Details: no episodes of LOB, good walker management,   Stairs            Wheelchair Mobility    Modified Rankin (Stroke Patients Only)       Balance                                    Cognition Arousal/Alertness: Awake/alert Behavior During Therapy: WFL for tasks assessed/performed Overall Cognitive Status: Within Functional Limits for tasks assessed                      Exercises      General Comments        Pertinent Vitals/Pain no apparent distress     Home Living                      Prior Function            PT Goals (current goals can now be found in the care plan section)  Progress towards PT goals: Progressing toward goals    Frequency  Min 3X/week    PT Plan Current plan remains appropriate    Co-evaluation             End of Session Equipment Utilized During Treatment: Gait belt Activity Tolerance: Patient tolerated treatment well Patient left: with call bell/phone within reach;in bed     Time: [id number] PT Time Calculation (min): 27 min  Charges:  $Gait Training: 23-37 mins                    G Codes:      Fredrich BirksRobinette, Milania Haubner Elizabeth 01/24/2014, 2:26 PM 01/24/2014 Fredrich Birksobinette, Casidy Alberta Elizabeth PTA [phone] pager [phone] office

## 2014-01-24 NOTE — Progress Notes (Signed)
OT Cancellation Note  Patient Details Name: Alicia Smith MRN: [id number] DOB: [birth year]   Cancelled Treatment:    Reason Eval/Treat Not Completed: Patient declined, stated that she had just gotten back to bed and wanted to take a nap. Will continue to follow.  Maurene CapesKeene, Domnick Chervenak [phone] 01/24/2014, 2:23 PM

## 2014-01-25 LAB — BASIC METABOLIC PANEL
Anion gap: 14 (ref 5–15)
BUN: 10 mg/dL (ref 6–23)
CO2: 22 mEq/L (ref 19–32)
Calcium: 8.2 mg/dL — ABNORMAL LOW (ref 8.4–10.5)
Chloride: 104 mEq/L (ref 96–112)
Creatinine, Ser: 1.22 mg/dL — ABNORMAL HIGH (ref 0.50–1.10)
GFR calc Af Amer: 49 mL/min — ABNORMAL LOW (ref >90)
GFR, EST NON AFRICAN AMERICAN: 42 mL/min — AB (ref >90)
Glucose, Bld: 101 mg/dL — ABNORMAL HIGH (ref 70–99)
Potassium: 3.5 mEq/L — ABNORMAL LOW (ref 3.7–5.3)
Sodium: 140 mEq/L (ref 137–147)

## 2014-01-25 MED ORDER — KCL IN DEXTROSE-NACL 30-5-0.45 MEQ/L-%-% IV SOLN
INTRAVENOUS | Status: DC
  Administered 2014-01-25 – 2014-01-27 (×4): via INTRAVENOUS
  Filled 2014-01-25 (×7): qty 1000

## 2014-01-25 NOTE — Progress Notes (Signed)
Occupational Therapy Treatment Patient Details Name: Alicia Smith MRN: [id number] DOB: 07-07-1936 Today's Date: 01/25/2014    History of present illness 78yo AAF admitted 6/27 with pneumoperitoneaum. Pt underwent an ex lap 01/20/14.   OT comments  Pt progressing toward goals but needs to improve activity tolerance.  Able to perform toileting tasks at min guard level. Required maximum encouragement to stay out of bed and sit up in chair. Will continue to follow acutely.  Follow Up Recommendations  Home health OT;Supervision/Assistance - 24 hour    Equipment Recommendations  3 in 1 bedside comode;Tub/shower seat    Recommendations for Other Services      Precautions / Restrictions Precautions Precautions: Fall       Mobility Bed Mobility Overal bed mobility:  (not assessed- pt in bathroom upon OT arrival)                Transfers Overall transfer level: Needs assistance Equipment used: Rolling walker (2 wheeled) Transfers: Sit to/from Stand Sit to Stand: Min guard              Balance                                   ADL       Grooming: Wash/dry hands;Min Production designer, theatre/television/filmguard;Standing                   Toilet Transfer: Min guard;Ambulation;Comfort height toilet;Cueing for safety   Toileting- Clothing Manipulation and Hygiene: Min guard;Cueing for safety;Sit to/from stand       Functional mobility during ADLs: Min guard General ADL Comments: Pt refusing to ambulate out in hall with therapist but agreeable to sitting up in recliner after max encouragment.  Cues for safety with RW and to not pull up on RW when standing.       Vision                     Perception     Praxis      Cognition   Behavior During Therapy: WFL for tasks assessed/performed Overall Cognitive Status: Within Functional Limits for tasks assessed                       Extremity/Trunk Assessment               Exercises     Shoulder  Instructions       General Comments      Pertinent Vitals/ Pain     See vitals tab  Home Living                                          Prior Functioning/Environment              Frequency Min 2X/week     Progress Toward Goals  OT Goals(current goals can now be found in the care plan section)  Progress towards OT goals: Progressing toward goals  Acute Rehab OT Goals Patient Stated Goal: to go home OT Goal Formulation: With patient Time For Goal Achievement: 01/30/14 Potential to Achieve Goals: Good ADL Goals Pt Will Perform Grooming: with supervision;standing Pt Will Perform Lower Body Bathing: with supervision;sit to/from stand Pt Will Perform Lower Body Dressing: with supervision;sit to/from stand Pt Will Transfer to Toilet: with supervision;ambulating;regular height  toilet;bedside commode;grab bars Pt Will Perform Toileting - Clothing Manipulation and hygiene: with supervision;sit to/from stand Pt Will Perform Tub/Shower Transfer: Tub transfer;with supervision;shower seat;ambulating;rolling walker  Plan Discharge plan remains appropriate    Co-evaluation                 End of Session Equipment Utilized During Treatment: Rolling walker   Activity Tolerance Patient tolerated treatment well   Patient Left in chair;with call bell/phone within reach;with nursing/sitter in room   Nurse Communication Mobility status        Time: 1400-1420 OT Time Calculation (min): 20 min  Charges: OT General Charges $OT Visit: 1 Procedure OT Treatments $Self Care/Home Management : 8-22 mins  Cipriano MileJohnson, Karah Caruthers Elizabeth 01/25/2014, 2:36 PM  01/25/2014 Cipriano MileJohnson, Momodou Consiglio Elizabeth OTR/L Pager [phone] Office [phone]

## 2014-01-25 NOTE — Progress Notes (Signed)
Patient ID: Alicia ReapMary Derr-Penn, female   DOB: [birth year], 78 y.o.   MRN: [id number]  [facility]  POD# 5  Subjective: Patient in good spirits, some flatus.  Objective: Vital signs in last 24 hours: Temp:  [98.9 F (37.2 C)-99.7 F (37.6 C)] 99.7 F (37.6 C) (07/03 0544) Pulse Rate:  [91-96] 96 (07/03 0544) Resp:  [18] 18 (07/03 0544) BP: (94-111)/(68-87) 110/72 mmHg (07/03 0544) SpO2:  [97 %-98 %] 98 % (07/03 0544) Last BM Date: 01/18/14  Intake/Output from previous day: 07/02 0701 - 07/03 0700 In: 2393 [I.V.:2393] Out: 1575 [Urine:850; Emesis/NG output:725]  Exam: HEENT - clear, not icteric Neck - soft Chest - clear bilaterally Cor - RRR, no murmur Abd - soft, wound clear and dry and intact; BS present; non-tender Ext - no significant edema Neuro - grossly intact, no focal deficits  Lab Results:  No results found for this basename: WBC, HGB, HCT, PLT,  in the last 72 hours   Recent Labs  01/23/14 1010 01/25/14 0525  NA 140 140  K 4.1 3.5*  CL 104 104  CO2 23 22  GLUCOSE 100* 101*  BUN 13 10  CREATININE 1.24* 1.22*  CALCIUM 8.6 8.2*    Studies/Results: No results found.  Assessment / Plan: 1.  Status post ex lap for pneumoperitoneum  Discontinue NG tube  Sips clear liquids  Encouraged OOB, ambulation  Velora Hecklerodd M. Aylanie Cubillos, MD, [facility] Office: [phone]  01/25/2014

## 2014-01-26 NOTE — Progress Notes (Signed)
Patient ID: Alicia ReapMary Derr-Penn, female   DOB: 1936-06-30, 78 y.o.   MRN: [id number]  [facility] - Progress Note  POD# 7  Subjective: Patient with one episode emesis last night.  Drinking ginger ale this AM. Denies abd pain.  No BM's.  Objective: Vital signs in last 24 hours: Temp:  [97.9 F (36.6 C)-99 F (37.2 C)] 97.9 F (36.6 C) (07/04 0636) Pulse Rate:  [87-110] 87 (07/04 0636) Resp:  [16-17] 16 (07/04 0636) BP: (100-109)/(64-74) 100/72 mmHg (07/04 0636) SpO2:  [93 %-100 %] 99 % (07/04 0636) Last BM Date: 01/18/14  Intake/Output from previous day: 07/03 0701 - 07/04 0700 In: 2314.5 [P.O.:1022; I.V.:1292.5] Out: 1250 [Urine:1250]  Exam: HEENT - clear, not icteric Neck - soft Chest - clear bilaterally Cor - RRR, no murmur Abd - moderate distension; BS present; midline wound dry and intact with staples Ext - no significant edema Neuro - grossly intact, no focal deficits  Lab Results:  No results found for this basename: WBC, HGB, HCT, PLT,  in the last 72 hours   Recent Labs  01/23/14 1010 01/25/14 0525  NA 140 140  K 4.1 3.5*  CL 104 104  CO2 23 22  GLUCOSE 100* 101*  BUN 13 10  CREATININE 1.24* 1.22*  CALCIUM 8.6 8.2*    Studies/Results: No results found.  Assessment / Plan: 1. Status post ex lap for pneumoperitoneum   Trial of clear liquids today   Encouraged OOB, ambulation  Velora Hecklerodd M. Abbigayle Toole, MD, [facility] Office: [phone]  01/26/2014

## 2014-01-27 ENCOUNTER — Encounter (HOSPITAL_COMMUNITY): Payer: Self-pay

## 2014-01-27 ENCOUNTER — Inpatient Hospital Stay (HOSPITAL_COMMUNITY): Payer: Medicare Other

## 2014-01-27 LAB — BASIC METABOLIC PANEL
Anion gap: 16 — ABNORMAL HIGH (ref 5–15)
BUN: 16 mg/dL (ref 6–23)
CO2: 18 mEq/L — ABNORMAL LOW (ref 19–32)
Calcium: 8.5 mg/dL (ref 8.4–10.5)
Chloride: 103 mEq/L (ref 96–112)
Creatinine, Ser: 1.23 mg/dL — ABNORMAL HIGH (ref 0.50–1.10)
GFR, EST AFRICAN AMERICAN: 48 mL/min — AB (ref >90)
GFR, EST NON AFRICAN AMERICAN: 42 mL/min — AB (ref >90)
Glucose, Bld: 150 mg/dL — ABNORMAL HIGH (ref 70–99)
POTASSIUM: 4.5 meq/L (ref 3.7–5.3)
Sodium: 137 mEq/L (ref 137–147)

## 2014-01-27 LAB — CK TOTAL AND CKMB (NOT AT ARMC)
CK TOTAL: 50 U/L (ref 7–177)
CK, MB: 2.8 ng/mL (ref 0.3–4.0)
Relative Index: INVALID (ref 0.0–2.5)

## 2014-01-27 LAB — CBC
HEMATOCRIT: 31.8 % — AB (ref 36.0–46.0)
Hemoglobin: 10.1 g/dL — ABNORMAL LOW (ref 12.0–15.0)
MCH: 26.6 pg (ref 26.0–34.0)
MCHC: 31.8 g/dL (ref 30.0–36.0)
MCV: 83.7 fL (ref 78.0–100.0)
PLATELETS: 310 10*3/uL (ref 150–400)
RBC: 3.8 MIL/uL — ABNORMAL LOW (ref 3.87–5.11)
RDW: 14.4 % (ref 11.5–15.5)
WBC: 11.3 10*3/uL — ABNORMAL HIGH (ref 4.0–10.5)

## 2014-01-27 LAB — TROPONIN I: TROPONIN I: 0.3 ng/mL — AB (ref <0.30)

## 2014-01-27 LAB — GLUCOSE, CAPILLARY: Glucose-Capillary: 88 mg/dL (ref 70–99)

## 2014-01-27 MED ORDER — HEPARIN BOLUS VIA INFUSION
4000.0000 [IU] | Freq: Once | INTRAVENOUS | Status: AC
  Administered 2014-01-27: 4000 [IU] via INTRAVENOUS
  Filled 2014-01-27: qty 4000

## 2014-01-27 MED ORDER — IOHEXOL 350 MG/ML SOLN
100.0000 mL | Freq: Once | INTRAVENOUS | Status: AC | PRN
  Administered 2014-01-27: 100 mL via INTRAVENOUS

## 2014-01-27 MED ORDER — KCL IN DEXTROSE-NACL 10-5-0.45 MEQ/L-%-% IV SOLN
INTRAVENOUS | Status: AC
  Administered 2014-01-27 – 2014-01-29 (×3): via INTRAVENOUS
  Filled 2014-01-27 (×7): qty 1000

## 2014-01-27 MED ORDER — OXYCODONE-ACETAMINOPHEN 5-325 MG PO TABS
1.0000 | ORAL_TABLET | ORAL | Status: DC | PRN
  Administered 2014-01-27: 2 via ORAL
  Administered 2014-01-28: 1 via ORAL
  Filled 2014-01-27: qty 2
  Filled 2014-01-27: qty 1

## 2014-01-27 MED ORDER — HEPARIN (PORCINE) IN NACL 100-0.45 UNIT/ML-% IJ SOLN
1300.0000 [IU]/h | INTRAMUSCULAR | Status: AC
  Administered 2014-01-27: 1100 [IU]/h via INTRAVENOUS
  Administered 2014-01-28 – 2014-01-29 (×2): 1200 [IU]/h via INTRAVENOUS
  Administered 2014-01-31 – 2014-02-04 (×4): 1300 [IU]/h via INTRAVENOUS
  Filled 2014-01-27 (×14): qty 250

## 2014-01-27 MED ORDER — HYDROCODONE-ACETAMINOPHEN 5-325 MG PO TABS
1.0000 | ORAL_TABLET | ORAL | Status: DC | PRN
  Administered 2014-01-27: 2 via ORAL
  Filled 2014-01-27: qty 2

## 2014-01-27 NOTE — Progress Notes (Addendum)
1507: Patient was being assisted to ambulate in Alicia Smith and complained of "feeling funny" after walking approximately 45 feet. Had her sit down in chair and her eyes rolled back and could not answer questions. Patient had pulse, breathing, and became diaphoretic and urinated on self. BP 75/57.  Rapid Response nurse called, patient placed in bed, at which time she began to talk and answered questions appropriately. Vital signs 101/69, hr 119, r 32, 88% on room air, 02 applied 2L, saturation 100%. No complaints of pain by patient.  Rapid Response continued to monitor patient  and MD notified. Stat labs, stat EKG orders received

## 2014-01-27 NOTE — Progress Notes (Addendum)
Paged [facility] MD regarding CT angio report of multiple moderate pulmonary embolisms and right heart strain.  Dr. Lindie SpruceWyatt to put in orders for transfer and heparin therapy.  Pt denies SOB and chest pain, HR 125 O2 at 100%.  Family at bedside and aware of plan.

## 2014-01-27 NOTE — Progress Notes (Signed)
Informed by the nurse that the patient has multiple bilateral PE's.  Will transfer to SDU and start on heparin per pharmacy.  Will follow H/H.  Hemodynamically she is stable but will transfer to SDU.  Marta LamasJames O. Gae BonWyatt, III, MD, FACS [phone]--pager [phone]--office [facility]

## 2014-01-27 NOTE — Progress Notes (Signed)
ANTICOAGULATION CONSULT NOTE - Initial Consult  Pharmacy Consult for heparin Indication: pulmonary embolus  No Known Allergies  Patient Measurements: Height: 5' 4.96" (165 cm) Weight: 145 lb 11.6 oz (66.1 kg) IBW/kg (Calculated) : 56.91  Vital Signs: Temp: 96.1 F (35.6 C) (07/05 1606) Temp src: Oral (07/05 1445) BP: 109/72 mmHg (07/05 2148) Pulse Rate: 125 (07/05 2148)  Labs:  Recent Labs  01/25/14 0525 01/27/14 1555  HGB  --  10.1*  HCT  --  31.8*  PLT  --  310  CREATININE 1.22* 1.23*  CKTOTAL  --  50  CKMB  --  2.8  TROPONINI  --  0.30*    Estimated Creatinine Clearance: 35 ml/min (by C-G formula based on Cr of 1.23).   Medical History: Past Medical History  Diagnosis Date  . High cholesterol   . Sleep apnea     HX OF APNEA LOST WEIGHT AND NO LONGER   . H/O hiatal hernia   . GERD (gastroesophageal reflux disease)   . Pernicious anemia     Medications:  Facility-administered medications prior to admission  Medication Dose Route Frequency Provider Last Rate Last Dose  . cyanocobalamin ((VITAMIN B-12)) injection 1,000 mcg  1,000 mcg Intramuscular Once Kermit Baloiffany L Reed, DO       Prescriptions prior to admission  Medication Sig Dispense Refill  . lansoprazole (PREVACID) 30 MG capsule Take 30 mg by mouth daily as needed (acid relfux).      . magnesium oxide (MAG-OX) 400 (241.3 MG) MG tablet Take 1 tablet (400 mg total) by mouth 2 (two) times daily.  60 tablet  0  . Probiotic Product (PROBIOTIC DAILY PO) Take 1 capsule by mouth daily.      . ranitidine (ZANTAC) 150 MG tablet Take 150 mg by mouth as needed for heartburn.       Scheduled:  . antiseptic oral rinse  15 mL Mouth Rinse BID  . pantoprazole (PROTONIX) IV  40 mg Intravenous Q24H   Infusions:  . dextrose 5 % and 0.45 % NaCl with KCl 10 mEq/L 100 mL/hr at 01/27/14 2131    Assessment: 78yo female admitted 6/27 for ischemic bowel, now s/p ex lap for pneumoperitoneum, RR called for drop in BP w/  diaphoresis and tachycardia, CT confirms acute PE w/ evidence of right heart strain c/w at least submassive PE, to begin heparin.  Goal of Therapy:  Heparin level 0.3-0.7 units/ml Monitor platelets by anticoagulation protocol: Yes   Plan:  Will give heparin 4000 units x1 followed by gtt at 1100 units/hr and monitor heparin levels and CBC; f/u plan for long-term anticoag.  Vernard GamblesVeronda Lasalle Abee, PharmD, BCPS  01/27/2014,10:44 PM

## 2014-01-27 NOTE — Progress Notes (Signed)
Call by the patient's nurse and rapid response team about this patient who had an episode after walking where she dropped her BP and became diaphoretic, subsequently tachycardic.  Since the original call her labs have come back acceptable and her vitals have stabilized.  Her oxygen saturation has improved.  She is having no chest pain.  Her cardiac enzymes are not back yet.  Considering getting CTA of the chest to rule out a PE.  Marta LamasJames O. Gae BonWyatt, III, MD, FACS [phone]--pager [phone]--office [facility]

## 2014-01-27 NOTE — Significant Event (Signed)
Rapid Response Event Note  Overview: Time Called: 1508 Arrival Time: 1511 Event Type: Hypotension  Initial Focused Assessment: Per staff Patient was ambulating with RN and suddenly stated that she didn't feel good, patient was assisted to a chair.  She then slumped over to her left.  As she became more responsive she was able to move both sides of her body equally. Staff laid patient flat on a bed.  BP 75/57 , she received about 100cc fluid bolus Upon my arrival patient alert and oriented, lying in bed. Diaphoretic  BP 101/68  HR 114  RR 32 O2 sat 88% on RA  Lung sounds clear Heart tones regular Patient also passing gas, patient states that she has had stomach cramps today. Patient also states that last time she took hydrocodone it made her sweaty.  Interventions: Dr Lindie SpruceWyatt notified 12 lead EKG done Labs drawn RN to call if assistance needed.   Event Summary: Name of Physician Notified: Dr Lindie SpruceWyatt at 1520    at    Outcome: Stayed in room and stabalized  Event End Time: 1615  Marcellina MillinLayton, Milt Coye

## 2014-01-27 NOTE — Progress Notes (Signed)
Patient ID: Alicia Smith, female   DOB: [birth year], 78 y.o.   MRN: [id number]  [facility] Progress [facility]  POD# 7  Subjective: Patient without complains.  Denies nausea.  Small flatus this AM.  No BM.  Objective: Vital signs in last 24 hours: Temp:  [98.5 F (36.9 C)-99 F (37.2 C)] 98.9 F (37.2 C) (07/05 0539) Pulse Rate:  [80-116] 80 (07/05 0539) Resp:  [17-18] 17 (07/05 0539) BP: (106-128)/(66-84) 109/70 mmHg (07/05 0539) SpO2:  [98 %-100 %] 100 % (07/05 0539) Last BM Date: 01/18/14  Intake/Output from previous day: 07/04 0701 - 07/05 0700 In: 1900 [P.O.:100; I.V.:1800] Out: 550 [Urine:550]  Exam: HEENT - clear, not icteric Neck - soft Chest - clear bilaterally Cor - RRR, no murmur Abd - soft, protuberant, mildly distended; BS present; wound clear and dry Ext - no significant edema Neuro - grossly intact, no focal deficits  Lab Results:  No results found for this basename: WBC, HGB, HCT, PLT,  in the last 72 hours   Recent Labs  01/25/14 0525  NA 140  K 3.5*  CL 104  CO2 22  GLUCOSE 101*  BUN 10  CREATININE 1.22*  CALCIUM 8.2*    Studies/Results: No results found.  Assessment / Plan: 1. Status post ex lap for pneumoperitoneum   Will advance to full liquids today  Encouraged OOB, ambulation  Decrease IV rate  Begin po pain Rx  Velora Hecklerodd M. Markeya Mincy, MD, [facility] Office: [phone]  01/27/2014

## 2014-01-28 DIAGNOSIS — I2699 Other pulmonary embolism without acute cor pulmonale: Secondary | ICD-10-CM

## 2014-01-28 LAB — CBC
HEMATOCRIT: 34.4 % — AB (ref 36.0–46.0)
Hemoglobin: 11 g/dL — ABNORMAL LOW (ref 12.0–15.0)
MCH: 27.2 pg (ref 26.0–34.0)
MCHC: 32 g/dL (ref 30.0–36.0)
MCV: 85.1 fL (ref 78.0–100.0)
Platelets: 298 10*3/uL (ref 150–400)
RBC: 4.04 MIL/uL (ref 3.87–5.11)
RDW: 14.3 % (ref 11.5–15.5)
WBC: 9.6 10*3/uL (ref 4.0–10.5)

## 2014-01-28 LAB — HEPARIN LEVEL (UNFRACTIONATED)
HEPARIN UNFRACTIONATED: 0.27 [IU]/mL — AB (ref 0.30–0.70)
Heparin Unfractionated: 0.38 IU/mL (ref 0.30–0.70)

## 2014-01-28 LAB — MRSA PCR SCREENING: MRSA by PCR: NEGATIVE

## 2014-01-28 MED ORDER — BISACODYL 10 MG RE SUPP
10.0000 mg | Freq: Once | RECTAL | Status: AC
  Administered 2014-01-28: 10 mg via RECTAL
  Filled 2014-01-28: qty 1

## 2014-01-28 MED ORDER — POLYETHYLENE GLYCOL 3350 17 G PO PACK
17.0000 g | PACK | Freq: Every day | ORAL | Status: DC
  Administered 2014-01-28: 17 g via ORAL
  Filled 2014-01-28 (×2): qty 1

## 2014-01-28 MED ORDER — ENSURE COMPLETE PO LIQD
237.0000 mL | Freq: Two times a day (BID) | ORAL | Status: DC
  Administered 2014-01-28: 237 mL via ORAL

## 2014-01-28 MED ORDER — HEPARIN BOLUS VIA INFUSION
1000.0000 [IU] | Freq: Once | INTRAVENOUS | Status: AC
  Administered 2014-01-28: 1000 [IU] via INTRAVENOUS
  Filled 2014-01-28: qty 1000

## 2014-01-28 MED ORDER — PROMETHAZINE HCL 25 MG/ML IJ SOLN
12.5000 mg | INTRAMUSCULAR | Status: DC | PRN
  Administered 2014-01-28: 12.5 mg via INTRAVENOUS
  Filled 2014-01-28 (×2): qty 1

## 2014-01-28 NOTE — Progress Notes (Signed)
ANTICOAGULATION CONSULT NOTE - Follow up Consult  Pharmacy Consult for heparin Indication: pulmonary embolus  No Known Allergies  Patient Measurements: Height: 5\' 4"  (162.6 cm) Weight: 153 lb (69.4 kg) IBW/kg (Calculated) : 54.7  Vital Signs: Temp: 98.7 F (37.1 C) (07/06 0800) Temp src: Oral (07/06 0800) BP: 96/67 mmHg (07/06 0358) Pulse Rate: 109 (07/06 0358)  Labs:  Recent Labs  01/27/14 1555 01/28/14 0840  HGB 10.1* 11.0*  HCT 31.8* 34.4*  PLT 310 298  HEPARINUNFRC  --  0.27*  CREATININE 1.23*  --   CKTOTAL 50  --   CKMB 2.8  --   TROPONINI 0.30*  --     Estimated Creatinine Clearance: 37.2 ml/min (by C-G formula based on Cr of 1.23).   Medical History: Past Medical History  Diagnosis Date  . High cholesterol   . Sleep apnea     HX OF APNEA LOST WEIGHT AND NO LONGER   . H/O hiatal hernia   . GERD (gastroesophageal reflux disease)   . Pernicious anemia     Medications:  Facility-administered medications prior to admission  Medication Dose Route Frequency Provider Last Rate Last Dose  . cyanocobalamin ((VITAMIN B-12)) injection 1,000 mcg  1,000 mcg Intramuscular Once Kermit Baloiffany L Reed, DO       Prescriptions prior to admission  Medication Sig Dispense Refill  . lansoprazole (PREVACID) 30 MG capsule Take 30 mg by mouth daily as needed (acid relfux).      . magnesium oxide (MAG-OX) 400 (241.3 MG) MG tablet Take 1 tablet (400 mg total) by mouth 2 (two) times daily.  60 tablet  0  . Probiotic Product (PROBIOTIC DAILY PO) Take 1 capsule by mouth daily.      . ranitidine (ZANTAC) 150 MG tablet Take 150 mg by mouth as needed for heartburn.       Scheduled:  . antiseptic oral rinse  15 mL Mouth Rinse BID  . bisacodyl  10 mg Rectal Once  . feeding supplement (ENSURE COMPLETE)  237 mL Oral BID BM  . heparin  1,000 Units Intravenous Once  . pantoprazole (PROTONIX) IV  40 mg Intravenous Q24H  . polyethylene glycol  17 g Oral Daily   Infusions:  . dextrose 5 %  and 0.45 % NaCl with KCl 10 mEq/L 100 mL/hr at 01/27/14 2131  . heparin 1,100 Units/hr (01/27/14 2331)    Assessment: Alicia Smith admitted 6/27 for ischemic bowel, now s/p ex lap for pneumoperitoneum, RR called for drop in BP w/ diaphoresis and tachycardia, CT confirms acute PE w/ evidence of right heart strain c/w at least submassive PE, started on heparin infusion. HL this AM was sub-therapeutic at 0.27. CBC remains stable. RN reports no s/s of bleeding   Goal of Therapy:  Heparin level 0.3-0.7 units/ml (Aim for higher end of goal) Monitor platelets by anticoagulation protocol: Yes   Plan:  1) Given heparin bolus of 1000 units. Increase heparin drip to 1200 units/hr 2) F/u 8 hours HL  3) Monitor daily HL, CBC, and s/s of bleeding  4) F/u long-term anticoag plans; Xarelto vs. Coumadin   Vinnie LevelBenjamin Doylene Splinter, PharmD.  Clinical Pharmacist Pager [phone]

## 2014-01-28 NOTE — Progress Notes (Signed)
Xarelto will probably be easier to manage.  Will communicate with PCP to manage this medicine.  Ileus resolving.  Wilmon ArmsMatthew K. Corliss Skainssuei, MD, [facility]  General/ Trauma Surgery  01/28/2014 2:21 PM

## 2014-01-28 NOTE — Progress Notes (Signed)
Patient ID: Alicia ReapMary Smith, female   DOB: 07/03/1936, 78 y.o.   MRN: [id number] 8 Days Post-Op  Subjective: Pt feels ok, just wants a laxative.  Passing some flatus.  Not eating much.  Not a great appetite.  Denies leg pain, denies SOB/CP  Objective: Vital signs in last 24 hours: Temp:  [96.1 F (35.6 C)-98.7 F (37.1 C)] 98.7 F (37.1 C) (07/06 0800) Pulse Rate:  [94-125] 109 (07/06 0358) Resp:  [16-32] 18 (07/06 0358) BP: (75-111)/(57-76) 96/67 mmHg (07/06 0358) SpO2:  [88 %-100 %] 99 % (07/06 0358) Weight:  [153 lb (69.4 kg)] 153 lb (69.4 kg) (07/05 2243) Last BM Date: 01/19/14 (pta, per pt report)  Intake/Output from previous day: 07/05 0701 - 07/06 0700 In: 1846.5 [P.O.:240; I.V.:1606.5] Out: 250 [Urine:250] Intake/Output this shift:    PE: Abd: soft, +BS, minimally tender, incision c/d/i with staples, ND Heart: regular, tachy Lungs: CTAB EXT: no edema, pain in LE  Lab Results:   Recent Labs  01/27/14 1555 01/28/14 0840  WBC 11.3* 9.6  HGB 10.1* 11.0*  HCT 31.8* 34.4*  PLT 310 298   BMET  Recent Labs  01/27/14 1555  NA 137  K 4.5  CL 103  CO2 18*  GLUCOSE 150*  BUN 16  CREATININE 1.23*  CALCIUM 8.5   PT/INR No results found for this basename: LABPROT, INR,  in the last 72 hours CMP     Component Value Date/Time   NA 137 01/27/2014 1555   NA 141 01/03/2014 1405   K 4.5 01/27/2014 1555   CL 103 01/27/2014 1555   CO2 18* 01/27/2014 1555   GLUCOSE 150* 01/27/2014 1555   GLUCOSE 78 01/03/2014 1405   BUN 16 01/27/2014 1555   BUN 18 01/03/2014 1405   CREATININE 1.23* 01/27/2014 1555   CALCIUM 8.5 01/27/2014 1555   PROT 7.4 01/20/2014 0018   PROT 6.2 01/03/2014 1405   ALBUMIN 3.7 01/20/2014 0018   AST 18 01/20/2014 0018   ALT 9 01/20/2014 0018   ALKPHOS 89 01/20/2014 0018   BILITOT <0.2* 01/20/2014 0018   GFRNONAA 42* 01/27/2014 1555   GFRAA 48* 01/27/2014 1555   Lipase     Component Value Date/Time   LIPASE 101* 01/20/2014 0018       Studies/Results: Ct Angio  Chest Pe W/cm &/or Wo Cm  01/27/2014   CLINICAL DATA:  Hypoxemia.  Clinical concern for pulmonary embolism.  EXAM: CT ANGIOGRAPHY CHEST WITH CONTRAST  TECHNIQUE: Multidetector CT imaging of the chest was performed using the standard protocol during bolus administration of intravenous contrast. Multiplanar CT image reconstructions and MIPs were obtained to evaluate the vascular anatomy.  CONTRAST:  100mL OMNIPAQUE IOHEXOL 350 MG/ML SOLN  COMPARISON:  Chest radiographs dated 01/20/2014 and chest CT dated 01/10/2014.  FINDINGS: Multiple moderately large bilateral occluding and non occluding pulmonary arterial filling defects beginning just beyond the distal main pulmonary arteries. The maximum inner diameter of the right ventricle is 6.3 cm on image number 54 and the maximum inner diameter of the left ventricle is 2.4 cm on image number 48. The right ventricular to left ventricular ratio is 2.62.  Small bilateral pleural effusions. Moderate amount of upper abdominal ascites with progression. Previously demonstrated large left renal cyst. Cholecystectomy clips.  The previously demonstrated 4 mm nodule in the left lower lobe is currently not visualized. A 2 mm nodule in the posterior right middle lobe is unchanged on image number 40. Interval visualization of a 3 mm nodule in the superior  segment of the left lower lobe on image number 29.  No enlarged lymph nodes.  Thoracic spine degenerative changes.  Review of the MIP images confirms the above findings.  IMPRESSION: 1. Positive for acute PE with CT evidence of right heart strain (RV/LV Ratio = 2.62) consistent with at least submassive (intermediate risk)PE. The presence of right heart strain has been associated with an increased risk of morbidity and mortality. Consultation with Pulmonary and Critical Care Medicine is recommended. 2. Overall little change in minimal nodularity in both lungs. 3. Moderate amount of ascites, increased. Critical Value/emergent results were  called by telephone at the time of interpretation on 01/27/2014 at 9:34 PM to Noreene LarssonJill, the patient's nurse, who verbally acknowledged these results. She stated that she will inform the patient's physician.   Electronically Signed   By: Gordan PaymentSteve  Reid M.D.   On: 01/27/2014 21:35    Anti-infectives: Anti-infectives   Start     Dose/Rate Route Frequency Ordered Stop   01/20/14 0315  Ampicillin-Sulbactam (UNASYN) 3 g in sodium chloride 0.9 % 100 mL IVPB     3 g 100 mL/hr over 60 Minutes Intravenous  Once 01/20/14 0300 01/20/14 0520       Assessment/Plan   1. POD 8, ex lap for pneumoperitoneum, negative laparotomy 2. Post op ileus, resolving 3. Bilateral PEs  Plan: 1. Patient on heparin drip for PEs.  Will order venous dopplers.  Patient has been on DVT prophylaxis post op.  If doppler negative, ? Heme consult for coagulopathy work up. 2. Will d/w Dr. Corliss Skainssuei about xarelto vs coumadin 3. Keep on fulls, add ensure, start on miralax and given suppository.  LOS: 9 days    Gray Doering E 01/28/2014, 9:23 AM Pager: [phone]

## 2014-01-28 NOTE — Progress Notes (Signed)
ANTICOAGULATION CONSULT NOTE - Follow up Consult  Pharmacy Consult for heparin Indication: pulmonary embolus  No Known Allergies Patient Measurements: Height: 5\' 4"  (162.6 cm) Weight: 153 lb (69.4 kg) IBW/kg (Calculated) : 54.7 Vital Signs: Temp: 98.4 F (36.9 C) (07/06 1947) Temp src: Oral (07/06 1947) BP: 122/85 mmHg (07/06 1947) Pulse Rate: 95 (07/06 1616) Labs:  Recent Labs  01/27/14 1555 01/28/14 0840 01/28/14 1930  HGB 10.1* 11.0*  --   HCT 31.8* 34.4*  --   PLT 310 298  --   HEPARINUNFRC  --  0.27* 0.38  CREATININE 1.23*  --   --   CKTOTAL 50  --   --   CKMB 2.8  --   --   TROPONINI 0.30*  --   --    Estimated Creatinine Clearance: 37.2 ml/min (by C-G formula based on Cr of 1.23).  Medical History: Past Medical History  Diagnosis Date  . High cholesterol   . Sleep apnea     HX OF APNEA LOST WEIGHT AND NO LONGER   . H/O hiatal hernia   . GERD (gastroesophageal reflux disease)   . Pernicious anemia    Medications:  Facility-administered medications prior to admission  Medication Dose Route Frequency Provider Last Rate Last Dose  . cyanocobalamin ((VITAMIN B-12)) injection 1,000 mcg  1,000 mcg Intramuscular Once Kermit Baloiffany L Reed, DO       Prescriptions prior to admission  Medication Sig Dispense Refill  . lansoprazole (PREVACID) 30 MG capsule Take 30 mg by mouth daily as needed (acid relfux).      . magnesium oxide (MAG-OX) 400 (241.3 MG) MG tablet Take 1 tablet (400 mg total) by mouth 2 (two) times daily.  60 tablet  0  . Probiotic Product (PROBIOTIC DAILY PO) Take 1 capsule by mouth daily.      . ranitidine (ZANTAC) 150 MG tablet Take 150 mg by mouth as needed for heartburn.       Scheduled:  . antiseptic oral rinse  15 mL Mouth Rinse BID  . feeding supplement (ENSURE COMPLETE)  237 mL Oral BID BM  . pantoprazole (PROTONIX) IV  40 mg Intravenous Q24H  . polyethylene glycol  17 g Oral Daily   Infusions:  . dextrose 5 % and 0.45 % NaCl with KCl 10  mEq/L 100 mL/hr at 01/28/14 1635  . heparin 1,200 Units/hr (01/28/14 1900)   Assessment: 78yo female admitted 6/27 for ischemic bowel, now s/p ex lap for pneumoperitoneum found to have acute PE on CT with evidence of right heart strain started on heparin infusion.  Heparin level is therapeutic at 0.38.   Goal of Therapy:  Heparin level 0.3-0.7 units/ml (Aim for higher end of goal) Monitor platelets by anticoagulation protocol: Yes   Plan:  1) Continue heparin drip to 1200 units/hr 2) Monitor daily HL, CBC, and s/s of bleeding   Link SnufferJessica Mindel Friscia, PharmD, BCPS Clinical Pharmacist [phone] 01/28/2014, 8:04 PM

## 2014-01-28 NOTE — Progress Notes (Signed)
NUTRITION FOLLOW UP  DOCUMENTATION CODES  Per approved criteria   -Non-severe (moderate) malnutrition in the context of chronic illness    Pt meets criteria for MODERATE MALNUTRITION in the context of CHRONIC illness as evidenced by moderate loss of subcutaneous fat, moderate muscle loss, and 13% weight loss in less than 2 months.   Intervention:    Continue Ensure Complete PO BID, each supplement provides 350 kcal and 13 grams of protein.  Diet advancement as tolerated per surgeon.  Nutrition Dx:   Inadequate oral intake related to decreased appetite as evidenced by >13% weight loss and moderate muscle and fat wasting, ongoing.  Goal:   Intake to meet >90% of estimated nutrition needs.  Monitor:   PO intake, labs, weight trend.  Assessment:   78 yo AAF who has had two prior admissions for free air in abdomen (oct and earlier this month) with a negative workup. Her previous main c/o had been progressive 100 lb weight loss and diarrhea over the past year. She had had normal wbc, normal lactates prior admissions. Last admission, SBFT showed no extravasation of contrast and no pain on exam. She comes in now co of upper abd pain after eating nuts. C/o epigastric/ruq pain.  S/P exploratory laparotomy on 6/28 for pneumoperitoneum. Diet education provided on previous RD assessment on 6/30. See initial nutrition assessment for details. Diet has been advanced to full liquids. Patient with a poor appetite. Also receiving Ensure Complete PO BID. Ileus is resolving per surgery notes.  Height: Ht Readings from Last 1 Encounters:  01/27/14 5\' 4"  (1.626 m)    Weight Status:   Wt Readings from Last 1 Encounters:  01/27/14 153 lb (69.4 kg)  01/20/14  145 lb 11.6 oz (66.1 kg)  Re-estimated needs:  Kcal: 1650-1850 Protein: 75-90 gm Fluid: 1.7-1.9 L  Skin: closed incision on abdomen   Diet Order: Full Liquid   Intake/Output Summary (Last 24 hours) at 01/28/14 1430 Last data filed at  01/28/14 1100  Gross per 24 hour  Intake 1899.48 ml  Output    250 ml  Net 1649.48 ml    Last BM: 6/27   Labs:   Recent Labs Lab 01/23/14 1010 01/25/14 0525 01/27/14 1555  NA 140 140 137  K 4.1 3.5* 4.5  CL 104 104 103  CO2 23 22 18*  BUN 13 10 16   CREATININE 1.24* 1.22* 1.23*  CALCIUM 8.6 8.2* 8.5  GLUCOSE 100* 101* 150*    CBG (last 3)   Recent Labs  01/27/14 1508  GLUCAP 88    Scheduled Meds: . antiseptic oral rinse  15 mL Mouth Rinse BID  . feeding supplement (ENSURE COMPLETE)  237 mL Oral BID BM  . pantoprazole (PROTONIX) IV  40 mg Intravenous Q24H  . polyethylene glycol  17 g Oral Daily    Continuous Infusions: . dextrose 5 % and 0.45 % NaCl with KCl 10 mEq/L 100 mL/hr at 01/27/14 2131  . heparin 1,200 Units/hr (01/28/14 1050)    Joaquin CourtsKimberly Audie Wieser, RD, LDN, CNSC Pager [phone] After Hours Pager [phone]

## 2014-01-28 NOTE — Progress Notes (Signed)
Paged Alicia ChapelKelly Osborne PA at [phone]. Called back at 1626. Informed her of pt being positive for DVT LLE. No new orders received. Will continue to monitor.

## 2014-01-28 NOTE — Progress Notes (Signed)
VASCULAR LAB PRELIMINARY  PRELIMINARY  PRELIMINARY  PRELIMINARY  Bilateral lower extremity venous Dopplers completed.    Preliminary report:  There is acute non occlusive DVT noted in the left common femoral vein and acute, occlusive DVT noted in the left proximal to mid femoral vein and in the left peroneal vein.  All other veins appear thrombus free.  Iasia Forcier, RVT 01/28/2014, 3:22 PM

## 2014-01-28 NOTE — Care Management Note (Unsigned)
    Page 1 of 1   01/30/2014     3:24:19 PM CARE MANAGEMENT NOTE 01/30/2014  Patient:  Alicia Smith,Alicia Smith   Account Number:  [id number]  Date Initiated:  01/24/2014  Documentation initiated by:  Ronny FlurryWILE,HEATHER  Subjective/Objective Assessment:     Action/Plan:   Anticipated DC Date:     Anticipated DC Plan:           Choice offered to / List presented to:             Status of service:   Medicare Important Message given?  YES (If response is "NO", the following Medicare IM given date fields will be blank) Date Medicare IM given:  01/24/2014 Medicare IM given by:  Ronny FlurryWILE,HEATHER Date Additional Medicare IM given:  01/28/2014 Additional Medicare IM given by:  Caylin Nass GRAVES-BIGELOW  Discharge Disposition:    Per UR Regulation:    If discussed at Long Length of Stay Meetings, dates discussed:   01/24/2014  01/29/2014  01/31/2014    Comments:  01-30-14 1453 [address] [place] Graves-Bigelow, RN,BSN [phone] CM did speak to pt in reference to disposition needs and she would like to retrun home once medically stable. Pt states hse is from home alone and can have family come in to stay. CM will monitor as it gets closer to d/c. Pt now with PICC line and TNA, NG tube for bowel rest.  01-28-14 [address] Graves-Bigelow, RN,BSN [phone] Per MD notes POD 8, ex lap for pneumoperitoneum, negative laparotomy Post op ileus, resolving. Bilateral PEs On Iv heparin. CM will continue to monitor for disposition needs.   01-24-14 Left IM in room , attempted to explain, patient requesting to rest at present . Left IM in room . No signed copy in chart . Patient wishing not to be disturbed at present. Ronny FlurryHeather Wile RN BSN

## 2014-01-28 NOTE — Progress Notes (Signed)
Patient continues to be nauseated. Spitting up scant (<5cc) of yellow liquid. Declines anti-emetics at this time. Will continue to monitor.   Rochele PagesHancock, Wenceslao Loper A, RN

## 2014-01-28 NOTE — Progress Notes (Signed)
Paged Dr. Janee Mornhompson at 180 regarding pt c/o N/V. Returned call at [phone]. New orders received.

## 2014-01-28 NOTE — Progress Notes (Signed)
UR Completed Jazlynne Milliner Graves-Bigelow, RN,BSN [phone]  

## 2014-01-29 ENCOUNTER — Inpatient Hospital Stay (HOSPITAL_COMMUNITY): Payer: Medicare Other

## 2014-01-29 DIAGNOSIS — R000 Tachycardia, unspecified: Secondary | ICD-10-CM

## 2014-01-29 DIAGNOSIS — D638 Anemia in other chronic diseases classified elsewhere: Secondary | ICD-10-CM

## 2014-01-29 DIAGNOSIS — I82409 Acute embolism and thrombosis of unspecified deep veins of unspecified lower extremity: Secondary | ICD-10-CM

## 2014-01-29 DIAGNOSIS — R112 Nausea with vomiting, unspecified: Secondary | ICD-10-CM

## 2014-01-29 DIAGNOSIS — D500 Iron deficiency anemia secondary to blood loss (chronic): Secondary | ICD-10-CM

## 2014-01-29 DIAGNOSIS — I2699 Other pulmonary embolism without acute cor pulmonale: Secondary | ICD-10-CM

## 2014-01-29 LAB — CBC
HEMATOCRIT: 33.8 % — AB (ref 36.0–46.0)
Hemoglobin: 10.9 g/dL — ABNORMAL LOW (ref 12.0–15.0)
MCH: 26.7 pg (ref 26.0–34.0)
MCHC: 32.2 g/dL (ref 30.0–36.0)
MCV: 82.8 fL (ref 78.0–100.0)
Platelets: 319 10*3/uL (ref 150–400)
RBC: 4.08 MIL/uL (ref 3.87–5.11)
RDW: 14.3 % (ref 11.5–15.5)
WBC: 9.3 10*3/uL (ref 4.0–10.5)

## 2014-01-29 LAB — GLUCOSE, CAPILLARY
Glucose-Capillary: 94 mg/dL (ref 70–99)
Glucose-Capillary: 109 mg/dL — ABNORMAL HIGH (ref 70–99)

## 2014-01-29 LAB — HEPARIN LEVEL (UNFRACTIONATED): Heparin Unfractionated: 0.5 IU/mL (ref 0.30–0.70)

## 2014-01-29 MED ORDER — PHENOL 1.4 % MT LIQD
1.0000 | OROMUCOSAL | Status: DC | PRN
  Administered 2014-02-01 (×3): 1 via OROMUCOSAL
  Filled 2014-01-29 (×2): qty 177

## 2014-01-29 MED ORDER — ACETAMINOPHEN 325 MG PO TABS: 650.0000 mg | ORAL_TABLET | ORAL | Status: DC | PRN

## 2014-01-29 MED ORDER — SODIUM CHLORIDE 0.9 % IJ SOLN
10.0000 mL | INTRAMUSCULAR | Status: DC | PRN
  Administered 2014-02-03 – 2014-02-05 (×3): 10 mL
  Administered 2014-02-07: 30 mL

## 2014-01-29 MED ORDER — KCL IN DEXTROSE-NACL 10-5-0.45 MEQ/L-%-% IV SOLN
INTRAVENOUS | Status: AC
  Administered 2014-01-30: 04:00:00 via INTRAVENOUS
  Filled 2014-01-29 (×2): qty 1000

## 2014-01-29 MED ORDER — SODIUM CHLORIDE 0.9 % IJ SOLN
10.0000 mL | Freq: Two times a day (BID) | INTRAMUSCULAR | Status: DC
  Administered 2014-01-29: 20 mL
  Administered 2014-01-30 (×2): 10 mL
  Administered 2014-01-31: 20 mL
  Administered 2014-01-31 – 2014-02-05 (×9): 10 mL

## 2014-01-29 MED ORDER — INSULIN ASPART 100 UNIT/ML ~~LOC~~ SOLN: 0.0000 [IU] | Freq: Four times a day (QID) | SUBCUTANEOUS | Status: DC

## 2014-01-29 MED ORDER — TRACE MINERALS CR-CU-F-FE-I-MN-MO-SE-ZN IV SOLN
INTRAVENOUS | Status: AC
  Administered 2014-01-29: 19:00:00 via INTRAVENOUS
  Filled 2014-01-29: qty 1000

## 2014-01-29 MED ORDER — MORPHINE SULFATE 2 MG/ML IJ SOLN
1.0000 mg | INTRAMUSCULAR | Status: DC | PRN
  Administered 2014-01-29 – 2014-02-05 (×20): 2 mg via INTRAVENOUS
  Filled 2014-01-29 (×21): qty 1

## 2014-01-29 MED ORDER — FAT EMULSION 20 % IV EMUL
250.0000 mL | INTRAVENOUS | Status: AC
  Administered 2014-01-29: 250 mL via INTRAVENOUS
  Filled 2014-01-29: qty 250

## 2014-01-29 NOTE — Progress Notes (Signed)
Patient ID: Alicia Smith, female   DOB: Aug 27, 1935, 78 y.o.   MRN: [id number] 9 Days Post-Op  Subjective: Pt with multiple episodes of emesis overnight.  Had 2 small BMs and 1 large BM.  No abdominal pain.  Wants to get up and move.    Objective: Vital signs in last 24 hours: Temp:  [98.1 F (36.7 C)-99.4 F (37.4 C)] 98.5 F (36.9 C) (07/07 0800) Pulse Rate:  [95-108] 108 (07/07 0305) Resp:  [14-21] 19 (07/07 0305) BP: (109-129)/(72-92) 123/88 mmHg (07/07 0305) SpO2:  [97 %-99 %] 99 % (07/07 0305) Last BM Date: 01/28/14 (smears)  Intake/Output from previous day: 07/06 0701 - 07/07 0700 In: 2693 [I.V.:2693] Out: 403 [Urine:250; Emesis/NG output:153] Intake/Output this shift:    PE: Abd: soft, distended today, borborygmi present, incision c/d/i with staples present Heart: tachy, sounds regular, monitor showing irregular, but looks sinus tach with occasional PVCs to me. Lungs: CTAB   Lab Results:   Recent Labs  01/28/14 0840 01/29/14 0440  WBC 9.6 9.3  HGB 11.0* 10.9*  HCT 34.4* 33.8*  PLT 298 319   BMET  Recent Labs  01/27/14 1555  NA 137  K 4.5  CL 103  CO2 18*  GLUCOSE 150*  BUN 16  CREATININE 1.23*  CALCIUM 8.5   PT/INR No results found for this basename: LABPROT, INR,  in the last 72 hours CMP     Component Value Date/Time   NA 137 01/27/2014 1555   NA 141 01/03/2014 1405   K 4.5 01/27/2014 1555   CL 103 01/27/2014 1555   CO2 18* 01/27/2014 1555   GLUCOSE 150* 01/27/2014 1555   GLUCOSE 78 01/03/2014 1405   BUN 16 01/27/2014 1555   BUN 18 01/03/2014 1405   CREATININE 1.23* 01/27/2014 1555   CALCIUM 8.5 01/27/2014 1555   PROT 7.4 01/20/2014 0018   PROT 6.2 01/03/2014 1405   ALBUMIN 3.7 01/20/2014 0018   AST 18 01/20/2014 0018   ALT 9 01/20/2014 0018   ALKPHOS 89 01/20/2014 0018   BILITOT <0.2* 01/20/2014 0018   GFRNONAA 42* 01/27/2014 1555   GFRAA 48* 01/27/2014 1555   Lipase     Component Value Date/Time   LIPASE 101* 01/20/2014 0018        Studies/Results: Ct Angio Chest Pe W/cm &/or Wo Cm  01/27/2014   CLINICAL DATA:  Hypoxemia.  Clinical concern for pulmonary embolism.  EXAM: CT ANGIOGRAPHY CHEST WITH CONTRAST  TECHNIQUE: Multidetector CT imaging of the chest was performed using the standard protocol during bolus administration of intravenous contrast. Multiplanar CT image reconstructions and MIPs were obtained to evaluate the vascular anatomy.  CONTRAST:  100mL OMNIPAQUE IOHEXOL 350 MG/ML SOLN  COMPARISON:  Chest radiographs dated 01/20/2014 and chest CT dated 01/10/2014.  FINDINGS: Multiple moderately large bilateral occluding and non occluding pulmonary arterial filling defects beginning just beyond the distal main pulmonary arteries. The maximum inner diameter of the right ventricle is 6.3 cm on image number 54 and the maximum inner diameter of the left ventricle is 2.4 cm on image number 48. The right ventricular to left ventricular ratio is 2.62.  Small bilateral pleural effusions. Moderate amount of upper abdominal ascites with progression. Previously demonstrated large left renal cyst. Cholecystectomy clips.  The previously demonstrated 4 mm nodule in the left lower lobe is currently not visualized. A 2 mm nodule in the posterior right middle lobe is unchanged on image number 40. Interval visualization of a 3 mm nodule in the superior segment of  the left lower lobe on image number 29.  No enlarged lymph nodes.  Thoracic spine degenerative changes.  Review of the MIP images confirms the above findings.  IMPRESSION: 1. Positive for acute PE with CT evidence of right heart strain (RV/LV Ratio = 2.62) consistent with at least submassive (intermediate risk)PE. The presence of right heart strain has been associated with an increased risk of morbidity and mortality. Consultation with Pulmonary and Critical Care Medicine is recommended. 2. Overall little change in minimal nodularity in both lungs. 3. Moderate amount of ascites,  increased. Critical Value/emergent results were called by telephone at the time of interpretation on 01/27/2014 at 9:34 PM to Noreene LarssonJill, the patient's nurse, who verbally acknowledged these results. She stated that she will inform the patient's physician.   Electronically Signed   By: Gordan PaymentSteve  Reid M.D.   On: 01/27/2014 21:35    Anti-infectives: Anti-infectives   Start     Dose/Rate Route Frequency Ordered Stop   01/20/14 0315  Ampicillin-Sulbactam (UNASYN) 3 g in sodium chloride 0.9 % 100 mL IVPB     3 g 100 mL/hr over 60 Minutes Intravenous  Once 01/20/14 0300 01/20/14 0520       Assessment/Plan  1. POD 9, ex lap for pneumoperitoneum, negative laparotomy  2. Post op ileus, started throwing up again 3. Bilateral PEs 4. Multiple lower extremity DVTs in left femoral vein and peroneal vein   Plan: 1. If patient does not begin to open up soon and really eat, we will need to consider TNA as she is 9 days out. 2. Heme consult to rule out coagulopathy.  Patient has multiple DVTs, PEs, and was on chemical prophylaxis and mobilizing after surgery 3. EKG shows sinus tach with PVCs as I suspected.  No further needs for this. 4. DC bedrest, start PT/OT again and mobilization 5. Check abdominal film today, may need NGT replaced. 6. NPO for now 7. Hold on oral anticoagulant in case she needs NGT replaced.   LOS: 10 days    Alicia Smith 01/29/2014, 8:28 AM Pager: [phone]

## 2014-01-29 NOTE — Progress Notes (Signed)
NG tube placed per MD order. Pt tolerated procedure well, no s/s of acute distress noted.

## 2014-01-29 NOTE — Progress Notes (Signed)
Patient had large episode of yellow emesis. Still refusing antiemetics. When discussed the possibility of NG tube placement, patient adamantly stated "I will not get that tube back in my nose." Will pass along in report this morning. Will continue to monitor.   Rochele PagesHancock, Lisseth Brazeau A, RN

## 2014-01-29 NOTE — Consult Note (Signed)
INPATIENT HEMATOLOGY- ONCOLOGY CONSULTATION  Patient Care Team: Kermit Baloiffany L Reed, DO as PCP - General (Geriatric Medicine) Shirley FriarVincent C. Schooler, MD as Consulting Physician (Gastroenterology)  REASON FOR CONSULT: Deep Venous Thrombosis (DVT)/ Pulmonary Embolism I have seen the patient, examined her and edited the notes as follows  Consulting Physician: Artis DelayNi Maiya Kates, MD PCP: Dr. Renato Gailseed.   HPI: Alicia Smith 78 y.o. African American female admitted on 01/20/2014 with recurrent diarrhea, severe, 1 week history of right and epigastric pain, nausea and vomiting. She had been admitted twice in the recent past for free air in abdomen (Oct 2014 and June 19) and discharged in stable condition, with otherwise negative workup. She has lost a total of 100 lbs during this last year.The patient had not been ambulating prior to this admission due to all symptoms mentioned above resulting in increased debilitation.  She had no respiratory or neurological cardiac complaints on admission.  She was tachycardic due to dehydration and pain. Her white count and platelets on admission were normal. She did have anemia,which has not worsened since (she has a history of chronic kidney disease III) . On admission she was placed on prophylactic Lovenox.  CT of the abdomen and pelvis showed free air, perforated viscus in the setting of small bowel obstruction, requiring exploratory laparotomy on same day showing pneumoperitoneum but otherwise no other abnormalities.  She continued to have decreased mobility, declining physical therapy assistance until recently. On 7/1, she complained of Left lower extremity pain which worsened over the following 24 hrs. Status was complicated by an episode of desaturation, diaphoresis and tachycardia. CT angiogram on 7/5 revealed multiple moderate pulmonary embolisms and right heart strain. Small pulmonary nodules in the left lower lobe of the lungs were noted, stable from prior studies suggesting a  benign etiology. Dopplers showed left lower extremity DVTs. She was placed on heparin drip by Pharmacy in place of Lovenox. Risk factors include prolonged immobility as mentioned above, irregular heartbeat and recent surgery. She was not on ASA or NSAIDs. She denies any recent long distance travel. Denies recent trauma. She denies tobacco use. She does partake marijuana in social occasions, none over the last 6 months. She is not on hormonal replacement but had been on birth control pills in the past.  No history of peripartum thromboembolic event or history of recurrent miscarriages.There is no family history of blood clots or miscarriages. Her screening programs are up-to-date. Denies any history of cancer. She is now hemodynamically stable. Hematology was consulted for recommendations regarding her thrombotic event.   MEDICAL HISTORY:  Past Medical History  Diagnosis Date  . High cholesterol   . Sleep apnea     HX OF APNEA LOST WEIGHT AND NO LONGER   . H/O hiatal hernia   . GERD (gastroesophageal reflux disease)   . Pernicious anemia     SURGICAL HISTORY: Past Surgical History  Procedure Laterality Date  . Abdominal hysterectomy  1984    Dr. Gaynell FaceMarshall  . [place]olecyst[place]  [address]    [place]  . Cesarean section  1959    [place]  . Esophagogastroduodenoscopy Left 05/17/2013    Procedure: ESOPHAGOGASTRODUODENOSCOPY (EGD);  Surgeon: Willis ModenaWilliam Outlaw, MD;  [facility];  Service: Endoscopy;  Laterality: Left;  . Laparotomy N/A 01/20/2014    Procedure: EXPLORATORY LAPAROTOMY POSSIBLE BOWEL RESECTION POSSIBLE COLOSTOMY;  Surgeon: Atilano InaEric M Wilson, MD;  [facility];  Service: General;  Laterality: N/A;    SOCIAL HISTORY: History   Social History  . Marital  Status: Divorced    Spouse Name: N/A    Number of Children: N/A  . Years of Education: N/A   Occupational History  . Not on file.   Social History Main Topics  . Smoking status: Former Smoker    Types: Cigarettes     Quit date: 07/26/1982  . Smokeless tobacco: Former Neurosurgeon  . Alcohol Use: Yes     Comment: Social  . Drug Use: Yes    Special: Marijuana     Comment: no one on the past six months  . Sexual Activity: Not on file   Other Topics Concern  . Not on file   Social History Narrative  . No narrative on file    FAMILY HISTORY: Family History  Problem Relation Age of Onset  . Cancer Mother   . Cancer Father   . Cancer Sister     breast  . Diabetes Sister   . Hypertension Sister   . Cancer Brother     prostate  . Kidney disease Sister   . Heart disease Brother     ALLERGIES:  has No Known Allergies.  MEDICATIONS:  Scheduled Meds: . antiseptic oral rinse  15 mL Mouth Rinse BID  . pantoprazole (PROTONIX) IV  40 mg Intravenous Q24H   Continuous Infusions: . dextrose 5 % and 0.45 % NaCl with KCl 10 mEq/L 100 mL/hr at 01/29/14 0150  . heparin 1,200 Units/hr (01/29/14 0700)   PRN Meds:.acetaminophen, ondansetron (ZOFRAN) IV, ondansetron  ROS: Constitutional: Denies fevers, chills or abnormal night sweats Eyes: Denies blurriness of vision, double vision or watery eyes Ears, nose, mouth, throat, and face: Denies mucositis or sore throat Respiratory: Denies cough, dyspnea or wheezes Cardiovascular: She does have intermittent palpitations,  denies chest discomfort or lower extremity swelling, but did have left leg and knee pain in the events prior to DVT/ pulmonary emboli diagnosis Gastrointestinal: Positive for nausea and vomiting, heartburn and change in bowel habits with difficulty passing flatus and recent constipation due to pneumoperitoneum. Constipation is now resolved. No hematochezia. Prior to admission she had a 2 month history of diarrhea. Abdominal pain as above, now better controlled. Skin: Denies abnormal skin rashes Lymphatics: Denies new lymphadenopathy or easy bruising Neurological: Denies numbness, tingling or new weaknesses Behavioral/Psych: Mood is stable, no new  changes  All other systems were reviewed with the patient and are negative.   Family History:    Family History  Problem Relation Age of Onset  . Cancer Mother   . Cancer Father   . Cancer Sister     breast  . Diabetes Sister   . Hypertension Sister   . Cancer Brother     prostate  . Kidney disease Sister   . Heart disease Brother     No family history of  Deep Venous thrombosis (DVT) or Pulmonary Embolism  Social History:  reports that she quit smoking about 31 years ago. Her smoking use included Cigarettes. She smoked 0.00 packs per day. She has quit using smokeless tobacco. She reports that she drinks alcohol. She reports that she uses illicit drugs (Marijuana).She lives in [place], originally from [place]. She is divorced. One daughter.    Physical Exam   ECOG PERFORMANCE STATUS:  3  Symptomatic, >50% in bed, but not bedbound (Capable of only limited self-care, confined to bed or chair 50% or more of waking hours)    Filed Vitals:   01/29/14 0800  BP: 121/82  Pulse:   Temp: 98.5 F (36.9 C)  Resp: 18   Filed Weights   01/20/14 1030 01/27/14 2243  Weight: 145 lb 11.6 oz (66.1 kg) 153 lb (69.4 kg)    GENERAL: alert, uncomfortable due to nausea and abdominal distension. Stoic appearance SKIN: skin color, texture, turgor are normal, no rashes or significant lesions EYES: normal, conjunctiva are pink and non-injected, sclera clear OROPHARYNX: no exudate, no erythema and lips, buccal mucosa, and tongue normal. NG tube in situ with drainage of bilious liquid. NECK: supple, thyroid normal size, non-tender, without nodularity LYMPH:  no palpable lymphadenopathy in the cervical, axillary or inguinal LUNGS: clear to auscultation and percussion with normal breathing effort HEART: regular rate & rhythm and no murmurs. Intermittent ectopic beats, and no lower extremity edema ABDOMEN:soft but distended, incisional tenderness. Well healed surgical scar. Musculoskeletal:no  cyanosis of digits and no clubbing  PSYCH: alert & oriented x 3 with fluent speech NEURO: no focal motor/sensory deficits   LABORATORY DATA:    Recent Labs Lab 01/27/14 1555 01/28/14 0840 01/29/14 0440  WBC 11.3* 9.6 9.3  HGB 10.1* 11.0* 10.9*  HCT 31.8* 34.4* 33.8*  PLT 310 298 319  MCV 83.7 85.1 82.8  MCH 26.6 27.2 26.7  MCHC 31.8 32.0 32.2  RDW 14.4 14.3 14.3    Chemistries   Recent Labs Lab 01/23/14 1010 01/25/14 0525 01/27/14 1555  NA 140 140 137  K 4.1 3.5* 4.5  CL 104 104 103  CO2 23 22 18*  GLUCOSE 100* 101* 150*  BUN 13 10 16   CREATININE 1.24* 1.22* 1.23*  CALCIUM 8.6 8.2* 8.5    Radiology Studies:   I reviewed the imaging myself   Ct Chest W Contrast  01/10/2014  COMPARISON:  CT, 05/15/2013.  FINDINGS: CT CHEST FINDINGS  No neck base or axillary masses or pathologically enlarged lymph nodes.  Heart is normal in size. Minimal coronary artery calcifications. Great vessels are normal in caliber. No mediastinal or hilar masses or adenopathy.  Mild lung base subsegmental atelectasis and/ scarring, greater on the left. 4 mm nodule in the lateral left lower lobe, image 31, series 3. There are few other tiny nodules bilaterally. No lung consolidation or edema. No pleural effusion or pneumothorax.  CT ABDOMEN AND PELVIS FINDINGS  Moderate amount of free intraperitoneal air primarily collecting anteriorly. Most of the small bowel is dilated. Maximum diameter of the small bowel is approximately 5.8 cm. There is decompressed distal ileum and a possible transition point in the central abdomen. There is an abnormal position of the ligament of Treitz, with the third portion and fourth portion of the duodenum extending around the superior mesenteric vessels to lie in the right mid abdomen. This change in position may be postsurgical. This apparent small bowel obstruction may be on the basis of adhesions. No bowel mass is seen. There is no wall thickening. No pneumatosis  intestinalis is visualized.  The colon is mostly decompressed. There are diverticula along the sigmoid colon. No diverticulitis. A normal appendix is visualized.  Liver is normal in size. No liver mass or focal lesion. Normal spleen. Gallbladder surgically absent. No bile duct dilation. Normal pancreas. No adrenal masses. Stable 6.2 cm left renal cyst. Mild renal cortical thinning. No other renal abnormalities. No hydronephrosis. Ureters are not well visualized. Bladder is mostly decompressed but otherwise unremarkable.  Uterus is surgically absent.  No pelvic masses.  No pathologically enlarged lymph nodes.  Small to moderate amount of ascites.  Bony structures are demineralized. There are degenerative changes throughout the visualized spine. No  osteoblastic or osteolytic lesions.  IMPRESSION: 1. Moderate amount of free intraperitoneal air suggesting a perforated viscus. This is similar to the appearance on the prior CT. 2. Small bowel obstruction with an apparent transition point in the right central abdomen suggesting obstruction based on adhesions. No pneumatosis intestinalis. No bowel wall thickening to suggest ischemia. Distal ileum is decompressed. Colon is mostly decompressed. 3. Small to moderate amount of ascites. 4. Few small pulmonary nodules, the largest a 4 mm nodule in the lateral left lower lobe, which is stable from the prior exam strongly suggesting a benign etiology. Mild lung base subsegmental atelectasis and/or scarring. 5. No CT evidence of malignancy. Patient's referring physician's office was contacted with this report at the time of this dictation.   Electronically Signed   By: Amie Portlandavid  Ormond M.D.   On: 01/10/2014 15:08   Ct Angio Chest Pe W/cm &/or Wo Cm  01/27/2014   COMPARISON:  Chest radiographs dated 01/20/2014 and chest CT dated 01/10/2014.  FINDINGS: Multiple moderately large bilateral occluding and non occluding pulmonary arterial filling defects beginning just beyond the distal  main pulmonary arteries. The maximum inner diameter of the right ventricle is 6.3 cm on image number 54 and the maximum inner diameter of the left ventricle is 2.4 cm on image number 48. The right ventricular to left ventricular ratio is 2.62.  Small bilateral pleural effusions. Moderate amount of upper abdominal ascites with progression. Previously demonstrated large left renal cyst. Cholecystectomy clips.  The previously demonstrated 4 mm nodule in the left lower lobe is currently not visualized. A 2 mm nodule in the posterior right middle lobe is unchanged on image number 40. Interval visualization of a 3 mm nodule in the superior segment of the left lower lobe on image number 29.  No enlarged lymph nodes.  Thoracic spine degenerative changes.  Review of the MIP images confirms the above findings.  IMPRESSION: 1. Positive for acute PE with CT evidence of right heart strain (RV/LV Ratio = 2.62) consistent with at least submassive (intermediate risk)PE. The presence of right heart strain has been associated with an increased risk of morbidity and mortality. Consultation with Pulmonary and Critical Care Medicine is recommended. 2. Overall little change in minimal nodularity in both lungs. 3. Moderate amount of ascites, increased. Critical Value/emergent results were called by telephone at the time of interpretation on 01/27/2014 at 9:34 PM to Noreene LarssonJill, the patient's nurse, who verbally acknowledged these results. She stated that she will inform the patient's physician.   Electronically Signed   By: Gordan PaymentSteve  Reid M.D.   On: 01/27/2014 21:35   Ct Abdomen Pelvis W Contrast  01/10/2014    COMPARISON:  CT, 05/15/2013.  FINDINGS: CT CHEST FINDINGS  No neck base or axillary masses or pathologically enlarged lymph nodes.  Heart is normal in size. Minimal coronary artery calcifications. Great vessels are normal in caliber. No mediastinal or hilar masses or adenopathy.  Mild lung base subsegmental atelectasis and/ scarring,  greater on the left. 4 mm nodule in the lateral left lower lobe, image 31, series 3. There are few other tiny nodules bilaterally. No lung consolidation or edema. No pleural effusion or pneumothorax.  CT ABDOMEN AND PELVIS FINDINGS  Moderate amount of free intraperitoneal air primarily collecting anteriorly. Most of the small bowel is dilated. Maximum diameter of the small bowel is approximately 5.8 cm. There is decompressed distal ileum and a possible transition point in the central abdomen. There is an abnormal position of the ligament of Treitz,  with the third portion and fourth portion of the duodenum extending around the superior mesenteric vessels to lie in the right mid abdomen. This change in position may be postsurgical. This apparent small bowel obstruction may be on the basis of adhesions. No bowel mass is seen. There is no wall thickening. No pneumatosis intestinalis is visualized.  The colon is mostly decompressed. There are diverticula along the sigmoid colon. No diverticulitis. A normal appendix is visualized.  Liver is normal in size. No liver mass or focal lesion. Normal spleen. Gallbladder surgically absent. No bile duct dilation. Normal pancreas. No adrenal masses. Stable 6.2 cm left renal cyst. Mild renal cortical thinning. No other renal abnormalities. No hydronephrosis. Ureters are not well visualized. Bladder is mostly decompressed but otherwise unremarkable.  Uterus is surgically absent.  No pelvic masses.  No pathologically enlarged lymph nodes.  Small to moderate amount of ascites.  Bony structures are demineralized. There are degenerative changes throughout the visualized spine. No osteoblastic or osteolytic lesions.  IMPRESSION: 1. Moderate amount of free intraperitoneal air suggesting a perforated viscus. This is similar to the appearance on the prior CT. 2. Small bowel obstruction with an apparent transition point in the right central abdomen suggesting obstruction based on adhesions.  No pneumatosis intestinalis. No bowel wall thickening to suggest ischemia. Distal ileum is decompressed. Colon is mostly decompressed. 3. Small to moderate amount of ascites. 4. Few small pulmonary nodules, the largest a 4 mm nodule in the lateral left lower lobe, which is stable from the prior exam strongly suggesting a benign etiology. Mild lung base subsegmental atelectasis and/or scarring. 5. No CT evidence of malignancy. Patient's referring physician's office was contacted with this report at the time of this dictation.   Electronically Signed   By: Amie Portland M.D.   On: 01/10/2014 15:08   ASSESSMENT/PLAN:    1.  Left lower extremity DVT/Pulmonary Embolism:  This appears to be provoked by central line, immobilization, irregular heartbeat and recent surgery. She is currently on Heparin per pharmacy. According to current guidelines, there is no indication to order thrombophilia screening tests.  Anticoagulation therapies would include warfarin, low molecular weight heparin such as Lovenox or newer agents such as Rivaroxaban. Elastic compression stockings at 20-30 mmHg to reduce risks of chronic thrombophlebitis recommended while these options being entertained. Preventive measures such as avoiding hormonal supplement, avoiding cigarette smoking, keeping up-to-date with screening programs for early cancer detection, frequent ambulation for short and  long distance travel and aggressive DVT prophylaxis in all surgical settings is recommended. She is to undergo a PICC line placement today for parenteral nutrition due to malnutrition.  In the future, depending on her bowel function, I felt that warfarin therapy might be the safest choice for her due to reversibility. Duration of anticoagulation therapy would be 6 months to a year, probably a year in this situation due to high risk of recurrence of thrombosis and poor mobility. I will defer to Gen. surgery service to determine when it would be safe to  switch her to oral anticoagulation therapy. I will sign off. Please call my office [phone] for outpatient appointment one month after dismissal. 2. Anemia: Due to chronic disease, dilution and recent blood loss from surgery.  No new bleeding issues. No transfusion recommended at this time  3. Full Code  Other medical issues as per primary team  **Disclaimer: This note was dictated with voice recognition software. Similar sounding words can inadvertently be transcribed and this note may contain transcription errors  which may not have been corrected upon publication of note.**    Marcos Eke, PA-C @T @ 10:12 AM Bertis Ruddy, Cailen Mihalik, MD 01/29/2014

## 2014-01-29 NOTE — Progress Notes (Signed)
PARENTERAL NUTRITION CONSULT NOTE - INITIAL  Pharmacy Consult for tPN Indication: *  No Known Allergies  Patient Measurements: Height: 5\' 4"  (162.6 cm) Weight: 153 lb (69.4 kg) IBW/kg (Calculated) : 54.7 Adjusted Body Weight:  Usual Weight:   Vital Signs: Temp: 98.5 F (36.9 C) (07/07 0800) Temp src: Oral (07/07 0800) BP: 121/82 mmHg (07/07 0800) Pulse Rate: 108 (07/07 0305) Intake/Output from previous day: 07/06 0701 - 07/07 0700 In: 2693 [I.V.:2693] Out: 403 [Urine:250; Emesis/NG output:153] Intake/Output from this shift:    Labs:  Recent Labs  01/27/14 1555 01/28/14 0840 01/29/14 0440  WBC 11.3* 9.6 9.3  HGB 10.1* 11.0* 10.9*  HCT 31.8* 34.4* 33.8*  PLT 310 298 319     Recent Labs  01/27/14 1555  NA 137  K 4.5  CL 103  CO2 18*  GLUCOSE 150*  BUN 16  CREATININE 1.23*  CALCIUM 8.5   Estimated Creatinine Clearance: 37.2 ml/min (by C-G formula based on Cr of 1.23).    Recent Labs  01/27/14 1508  GLUCAP 88    Medical History: Past Medical History  Diagnosis Date  . High cholesterol   . Sleep apnea     HX OF APNEA LOST WEIGHT AND NO LONGER   . H/O hiatal hernia   . GERD (gastroesophageal reflux disease)   . Pernicious anemia     Medications:  Facility-administered medications prior to admission  Medication Dose Route Frequency Provider Last Rate Last Dose  . cyanocobalamin ((VITAMIN B-12)) injection 1,000 mcg  1,000 mcg Intramuscular Once Kermit Baloiffany L Reed, DO       Prescriptions prior to admission  Medication Sig Dispense Refill  . lansoprazole (PREVACID) 30 MG capsule Take 30 mg by mouth daily as needed (acid relfux).      . magnesium oxide (MAG-OX) 400 (241.3 MG) MG tablet Take 1 tablet (400 mg total) by mouth 2 (two) times daily.  60 tablet  0  . Probiotic Product (PROBIOTIC DAILY PO) Take 1 capsule by mouth daily.      . ranitidine (ZANTAC) 150 MG tablet Take 150 mg by mouth as needed for heartburn.        Insulin Requirements in  the past 24 hours:  None, Start SSI   Current Nutrition:    Assessment:C/o epigastric/ruq pain  78 y/o F admitted 01/19/2014.  She has had 2 prior admission for free air in abdomen with a negative w/u. S/p ex lap 01/20/14 for pneumoperitoneum, negative laparotomy. Continues to have decreased appetite as evidenced by >13% weight loss and moderate muscle and fat wasting per RD note.   GI: Post-op ileus. Continues to have N/V and distention. +BM. IV PPI for GERD. TPN for inadequate po intake since surgery 6/28. Endo: No h/o DM but glucose 150 on BMET Lytes: WNL Renal: Scr 1.23 Pulm: RA Cards: HLD Anticoag: Heparin for mult PE + R heart strain, multiple DVTs. Hepatobil: Lipase elevated on 6/29. Recheck tomorrow.  Neuro:  ID: Afebrile. WBC 9.3. No abx. Best Practices: IV heparin, IV PPI, mouth care TPN Access: PICC ordered 7/7 TPN day#:0   Nutritional Goals:  1650-1850 kCal, 75-90 grams of protein per day  Goal TPN: Clinimix E 5/15 at 7970ml/hr + 20% lipids at 910ml/hr= 1673 kcal and 84g protein daily.  Plan:  F/u PICC placement Start SSI Decrease IVF when TPN starts. Start Clinimix E 5/15 at 5040ml/hr + lipids at 7610ml/hr   Jamaree Hosier S. Merilynn Finlandobertson, PharmD, BCPS Clinical Staff Pharmacist Pager [phone]  Misty Stanleyobertson, Ginnette Gates Stillinger 01/29/2014,10:19 AM

## 2014-01-29 NOTE — Progress Notes (Signed)
ANTICOAGULATION CONSULT NOTE - Follow up Consult  Pharmacy Consult for heparin Indication: pulmonary embolus  No Known Allergies Patient Measurements: Height: 5\' 4"  (162.6 cm) Weight: 153 lb (69.4 kg) IBW/kg (Calculated) : 54.7 Vital Signs: Temp: 98.5 F (36.9 C) (07/07 0800) Temp src: Oral (07/07 0800) BP: 121/82 mmHg (07/07 0800) Pulse Rate: 108 (07/07 0305) Labs:  Recent Labs  01/27/14 1555 01/28/14 0840 01/28/14 1930 01/29/14 0440  HGB 10.1* 11.0*  --  10.9*  HCT 31.8* 34.4*  --  33.8*  PLT 310 298  --  319  HEPARINUNFRC  --  0.27* 0.38 0.50  CREATININE 1.23*  --   --   --   CKTOTAL 50  --   --   --   CKMB 2.8  --   --   --   TROPONINI 0.30*  --   --   --    Estimated Creatinine Clearance: 37.2 ml/min (by C-G formula based on Cr of 1.23).  Medical History: Past Medical History  Diagnosis Date  . High cholesterol   . Sleep apnea     HX OF APNEA LOST WEIGHT AND NO LONGER   . H/O hiatal hernia   . GERD (gastroesophageal reflux disease)   . Pernicious anemia    Medications:  Facility-administered medications prior to admission  Medication Dose Route Frequency Provider Last Rate Last Dose  . cyanocobalamin ((VITAMIN B-12)) injection 1,000 mcg  1,000 mcg Intramuscular Once Kermit Baloiffany L Reed, DO       Prescriptions prior to admission  Medication Sig Dispense Refill  . lansoprazole (PREVACID) 30 MG capsule Take 30 mg by mouth daily as needed (acid relfux).      . magnesium oxide (MAG-OX) 400 (241.3 MG) MG tablet Take 1 tablet (400 mg total) by mouth 2 (two) times daily.  60 tablet  0  . Probiotic Product (PROBIOTIC DAILY PO) Take 1 capsule by mouth daily.      . ranitidine (ZANTAC) 150 MG tablet Take 150 mg by mouth as needed for heartburn.       Scheduled:  . antiseptic oral rinse  15 mL Mouth Rinse BID  . feeding supplement (ENSURE COMPLETE)  237 mL Oral BID BM  . pantoprazole (PROTONIX) IV  40 mg Intravenous Q24H   Infusions:  . dextrose 5 % and 0.45 % NaCl  with KCl 10 mEq/L 100 mL/hr at 01/29/14 0150  . heparin 1,200 Units/hr (01/29/14 0700)   Assessment: Alicia Smith admitted 6/27 for ischemic bowel, now s/p ex lap for pneumoperitoneum found to have acute PE on CT with evidence of right heart strain started on heparin infusion.   Heparin level is therapeutic at 0.50. H/H and plt stable. RN reports no s/s of bleeding.   Goal of Therapy:  Heparin level 0.3-0.7 units/ml (Aim for higher end of goal) Monitor platelets by anticoagulation protocol: Yes   Plan:  1) Continue heparin drip to 1200 units/hr 2) Monitor daily HL, CBC, and s/s of bleeding   Vinnie LevelBenjamin Masiyah Jorstad, PharmD.  Clinical Pharmacist Pager [phone]

## 2014-01-29 NOTE — Progress Notes (Signed)
Patient having BM, but distended and nauseated. Plain films pending. Hematology consult - patient developed DVT and PE post-op, despite being on adequate chemoprophylaxis. Start TNA since patient has not been eating well and does not seem ready to take adequate PO intake at this time.  Wilmon ArmsMatthew K. Corliss Skainssuei, MD, [facility]  General/ Trauma Surgery  01/29/2014 9:46 AM

## 2014-01-29 NOTE — Progress Notes (Signed)
Peripherally Inserted Central Catheter/Midline Placement  The IV Nurse has discussed with the patient and/or persons authorized to consent for the patient, the purpose of this procedure and the potential benefits and risks involved with this procedure.  The benefits include less needle sticks, lab draws from the catheter and patient may be discharged home with the catheter.  Risks include, but not limited to, infection, bleeding, blood clot (thrombus formation), and puncture of an artery; nerve damage and irregular heat beat.  Alternatives to this procedure were also discussed.  PICC/Midline Placement Documentation  PICC Triple Lumen 01/29/14 PICC Right Brachial 40 cm 0 cm (Active)  Indication for Insertion or Continuance of Line Administration of hyperosmolar/irritating solutions (i.e. TPN, Vancomycin, etc.) 01/29/2014  5:51 PM  Exposed Catheter (cm) 0 cm 01/29/2014  5:51 PM  Dressing Change Due 02/05/14 01/29/2014  5:51 PM       Poff, Deatra RobinsonJessica Ann 01/29/2014, 5:56 PM

## 2014-01-29 NOTE — Progress Notes (Signed)
R PICC tip confirmed per radiology in CAJ/SVC. TNA initiated

## 2014-01-29 NOTE — Progress Notes (Signed)
Occupational Therapy Treatment Patient Details Name: [name] MRN: [id number] DOB: 08/07/1935 Today's Date: 01/29/2014    History of present illness 78yo AAF admitted 6/27 with pneumoperitoneaum. Pt underwent an ex lap 01/20/14.   OT comments  Pt. Initially reluctant to participate in skilled OT but did well with encouragement.  Able to complete sit/stand multiple times and stand-pivot transfers and peri-care with min guard a.  Declined recliner secondary to con't. C/o nausea.    Follow Up Recommendations  Home health OT;Supervision/Assistance - 24 hour    Equipment Recommendations  3 in 1 bedside comode;Tub/shower seat          Precautions / Restrictions Precautions Precautions: Fall Restrictions Weight Bearing Restrictions: No       Mobility Bed Mobility Overal bed mobility: Needs Assistance Bed Mobility: Sidelying to Sit;Supine to Sit;Sit to Supine;Sit to Sidelying Rolling: Supervision Sidelying to sit: Supervision Supine to sit: Supervision Sit to supine: Min assist Sit to sidelying: Supervision General bed mobility comments: pt. requires min a to bring bles into bed and cues for scooting up in bed  Transfers Overall transfer level: Needs assistance Equipment used: 1 person hand held assist Transfers: Sit to/from Stand;Stand Pivot Transfers Sit to Stand: Min guard Stand pivot transfers: Min guard       General transfer comment: v/c's for hand placement, increased time    Balance                                   ADL Overall ADL's : Needs assistance/impaired                         Toilet Transfer: Min guard;Stand-pivot;BSC;Cueing for sequencing   Toileting- ArchitectClothing Manipulation and Hygiene: Min guard;Sit to/from stand;Cueing for safety       Functional mobility during ADLs: Min guard General ADL Comments: pt. initially reluctant for participation and had several c/o nausea but agreeable to oob                                                                   Pertinent Vitals/ Pain      No c/o pain, c/o nausea throughout session                                                          Frequency Min 2X/week     Progress Toward Goals  OT Goals(current goals can now be found in the care plan section)  Progress towards OT goals: Progressing toward goals     Plan Discharge plan remains appropriate    End of Session     Activity Tolerance Patient tolerated treatment well   Patient Left in bed;with call bell/phone within reach   Nurse Communication          Time: [id number] OT Time Calculation (min): 23 min  Charges: OT General Charges $OT Visit: 1 Procedure OT Treatments $Self Care/Home Management : 23-37 mins  Robet LeuMorris, Nakiea Metzner Lorraine, COTA/L 01/29/2014, 11:37 AM

## 2014-01-30 LAB — COMPREHENSIVE METABOLIC PANEL
ALT: 6 U/L (ref 0–35)
ANION GAP: 12 (ref 5–15)
AST: 18 U/L (ref 0–37)
Albumin: 2.4 g/dL — ABNORMAL LOW (ref 3.5–5.2)
Alkaline Phosphatase: 47 U/L (ref 39–117)
BILIRUBIN TOTAL: 0.2 mg/dL — AB (ref 0.3–1.2)
BUN: 16 mg/dL (ref 6–23)
CALCIUM: 8.1 mg/dL — AB (ref 8.4–10.5)
CHLORIDE: 103 meq/L (ref 96–112)
CO2: 22 meq/L (ref 19–32)
Creatinine, Ser: 1.24 mg/dL — ABNORMAL HIGH (ref 0.50–1.10)
GFR, EST AFRICAN AMERICAN: 48 mL/min — AB (ref >90)
GFR, EST NON AFRICAN AMERICAN: 41 mL/min — AB (ref >90)
Glucose, Bld: 105 mg/dL — ABNORMAL HIGH (ref 70–99)
Potassium: 3.9 mEq/L (ref 3.7–5.3)
Sodium: 137 mEq/L (ref 137–147)
Total Protein: 5.3 g/dL — ABNORMAL LOW (ref 6.0–8.3)

## 2014-01-30 LAB — GLUCOSE, CAPILLARY
GLUCOSE-CAPILLARY: 110 mg/dL — AB (ref 70–99)
GLUCOSE-CAPILLARY: 119 mg/dL — AB (ref 70–99)
GLUCOSE-CAPILLARY: 119 mg/dL — AB (ref 70–99)
GLUCOSE-CAPILLARY: 93 mg/dL (ref 70–99)
Glucose-Capillary: 100 mg/dL — ABNORMAL HIGH (ref 70–99)
Glucose-Capillary: 84 mg/dL (ref 70–99)
Glucose-Capillary: 129 mg/dL — ABNORMAL HIGH (ref 70–99)

## 2014-01-30 LAB — CBC
HEMATOCRIT: 31.2 % — AB (ref 36.0–46.0)
Hemoglobin: 9.9 g/dL — ABNORMAL LOW (ref 12.0–15.0)
MCH: 26.8 pg (ref 26.0–34.0)
MCHC: 31.7 g/dL (ref 30.0–36.0)
MCV: 84.3 fL (ref 78.0–100.0)
PLATELETS: 281 10*3/uL (ref 150–400)
RBC: 3.7 MIL/uL — AB (ref 3.87–5.11)
RDW: 14.4 % (ref 11.5–15.5)
WBC: 8.3 10*3/uL (ref 4.0–10.5)

## 2014-01-30 LAB — PHOSPHORUS: PHOSPHORUS: 3.3 mg/dL (ref 2.3–4.6)

## 2014-01-30 LAB — PREALBUMIN: Prealbumin: 7.8 mg/dL — ABNORMAL LOW (ref 17.0–34.0)

## 2014-01-30 LAB — TRIGLYCERIDES: Triglycerides: 72 mg/dL (ref <150)

## 2014-01-30 LAB — MAGNESIUM: MAGNESIUM: 1.6 mg/dL (ref 1.5–2.5)

## 2014-01-30 LAB — HEPARIN LEVEL (UNFRACTIONATED): Heparin Unfractionated: 0.44 IU/mL (ref 0.30–0.70)

## 2014-01-30 MED ORDER — TRACE MINERALS CR-CU-F-FE-I-MN-MO-SE-ZN IV SOLN
INTRAVENOUS | Status: AC
  Administered 2014-01-30: 17:00:00 via INTRAVENOUS
  Filled 2014-01-30: qty 2000

## 2014-01-30 MED ORDER — SALINE SPRAY 0.65 % NA SOLN
1.0000 | NASAL | Status: DC | PRN
  Administered 2014-02-01: 1 via NASAL
  Filled 2014-01-30: qty 44

## 2014-01-30 MED ORDER — KCL IN DEXTROSE-NACL 10-5-0.45 MEQ/L-%-% IV SOLN
INTRAVENOUS | Status: DC
  Administered 2014-02-01 – 2014-02-05 (×3): via INTRAVENOUS
  Filled 2014-01-30 (×9): qty 1000

## 2014-01-30 MED ORDER — FAT EMULSION 20 % IV EMUL
250.0000 mL | INTRAVENOUS | Status: AC
  Administered 2014-01-30: 250 mL via INTRAVENOUS
  Filled 2014-01-30: qty 250

## 2014-01-30 NOTE — Progress Notes (Signed)
Post-op ileus Continue NG for now.  Wilmon ArmsMatthew K. Corliss Skainssuei, MD, [facility]  General/ Trauma Surgery  01/30/2014 4:27 PM

## 2014-01-30 NOTE — Progress Notes (Signed)
NUTRITION FOLLOW UP  DOCUMENTATION CODES  Per approved criteria   -Non-severe (moderate) malnutrition in the context of chronic illness    Pt meets criteria for MODERATE MALNUTRITION in the context of CHRONIC illness as evidenced by moderate loss of subcutaneous fat, moderate muscle loss, and 13% weight loss in less than 2 months.   Intervention:    TPN per Pharmacy to meet nutrition goals as able, 1650-1850 kcals and 75-90 gm protein per day.  Nutrition Dx:   Inadequate oral intake related to decreased appetite as evidenced by >13% weight loss and moderate muscle and fat wasting, ongoing.  Goal:   Intake to meet >90% of estimated nutrition needs. Unmet.  Monitor:   TPN tolerance/adequacy, labs, weight trend, GI function and ability to resume PO diet.  Assessment:   78 yo AAF who has had two prior admissions for free air in abdomen (oct and earlier this month) with a negative workup. Her previous main c/o had been progressive 100 lb weight loss and diarrhea over the past year. She had had normal wbc, normal lactates prior admissions. Last admission, SBFT showed no extravasation of contrast and no pain on exam. She comes in now co of upper abd pain after eating nuts. C/o epigastric/ruq pain.  S/P exploratory laparotomy on 6/28 for pneumoperitoneum. Diet was advanced to full liquids on 7/5. Patient with ongoing ileus, unable to tolerate full liquids, vomiting. Patient made NPO and NGT placed on 7/7. PICC placed and TPN initiated on 7/7.  Patient is receiving TPN with Clinimix E 5/15 @ 40 ml/hr and lipids @ 10 ml/hr. Provides 1200 ml, 1162 kcal, and 48 grams protein per day. Meets 70% minimum estimated energy needs and 64% minimum estimated protein needs.   Height: Ht Readings from Last 1 Encounters:  01/27/14 5\' 4"  (1.626 m)    Weight Status:   Wt Readings from Last 1 Encounters:  01/27/14 153 lb (69.4 kg)  01/20/14  145 lb 11.6 oz (66.1 kg)  Re-estimated needs:  Kcal:  1650-1850 Protein: 75-90 gm Fluid: 1.7-1.9 L  Skin: closed incision on abdomen   Diet Order: NPO   Intake/Output Summary (Last 24 hours) at 01/30/14 0910 Last data filed at 01/30/14 0735  Gross per 24 hour  Intake   2438 ml  Output   2200 ml  Net    238 ml    Last BM: 7/7   Labs:   Recent Labs Lab 01/25/14 0525 01/27/14 1555 01/30/14 0405  NA 140 137 137  K 3.5* 4.5 3.9  CL 104 103 103  CO2 22 18* 22  BUN 10 16 16   CREATININE 1.22* 1.23* 1.24*  CALCIUM 8.2* 8.5 8.1*  MG  --   --  1.6  PHOS  --   --  3.3  GLUCOSE 101* 150* 105*    CBG (last 3)   Recent Labs  01/29/14 2343 01/30/14 0402 01/30/14 0758  GLUCAP 110* 100* 93    Scheduled Meds: . antiseptic oral rinse  15 mL Mouth Rinse BID  . insulin aspart  0-9 Units Subcutaneous 4 times per day  . pantoprazole (PROTONIX) IV  40 mg Intravenous Q24H  . sodium chloride  10-40 mL Intracatheter Q12H    Continuous Infusions: . dextrose 5 % and 0.45 % NaCl with KCl 10 mEq/L 60 mL/hr at 01/30/14 0700  . Marland Kitchen.TPN (CLINIMIX-E) Adult 40 mL/hr at 01/30/14 0700   And  . fat emulsion 500 kcal (01/30/14 0700)  . heparin 1,250 Units/hr (01/30/14 0806)  Molli Barrows, RD, LDN, [place] Pager [phone] After Hours Pager [phone]

## 2014-01-30 NOTE — Progress Notes (Signed)
[facility] Progress Note  10 Days Post-Op  Subjective: Pt doing better.  No more N/V since NG tube placed.  145650mL/24 hours.  Abdominal distension improved.  Not much abdominal pain.  Nose hurting her more than anything.  Wants to get OOB.  Objective: Vital signs in last 24 hours: Temp:  [98 F (36.7 C)-99.1 F (37.3 C)] 98.7 F (37.1 C) (07/08 0700) Pulse Rate:  [89-139] 139 (07/08 0715) Resp:  [14-24] 18 (07/08 0715) BP: (98-110)/(53-96) 108/89 mmHg (07/08 0715) SpO2:  [98 %-100 %] 98 % (07/08 0715) Last BM Date: 01/29/14  Intake/Output from previous day: 07/07 0701 - 07/08 0700 In: 2438 [I.V.:1788; TPN:650] Out: 2000 [Urine:250; Emesis/NG output:1750] Intake/Output this shift: Total I/O In: -  Out: 200 [Urine:200]  PE: Gen:  Alert, NAD, pleasant Abd: Soft, distended, minimally tender, diminished BS, no HSM, incisions C/D/I with staples in place   Lab Results:   Recent Labs  01/29/14 0440 01/30/14 0405  WBC 9.3 8.3  HGB 10.9* 9.9*  HCT 33.8* 31.2*  PLT 319 281   BMET  Recent Labs  01/27/14 1555 01/30/14 0405  NA 137 137  K 4.5 3.9  CL 103 103  CO2 18* 22  GLUCOSE 150* 105*  BUN 16 16  CREATININE 1.23* 1.24*  CALCIUM 8.5 8.1*   PT/INR No results found for this basename: LABPROT, INR,  in the last 72 hours CMP     Component Value Date/Time   NA 137 01/30/2014 0405   NA 141 01/03/2014 1405   K 3.9 01/30/2014 0405   CL 103 01/30/2014 0405   CO2 22 01/30/2014 0405   GLUCOSE 105* 01/30/2014 0405   GLUCOSE 78 01/03/2014 1405   BUN 16 01/30/2014 0405   BUN 18 01/03/2014 1405   CREATININE 1.24* 01/30/2014 0405   CALCIUM 8.1* 01/30/2014 0405   PROT 5.3* 01/30/2014 0405   PROT 6.2 01/03/2014 1405   ALBUMIN 2.4* 01/30/2014 0405   AST 18 01/30/2014 0405   ALT 6 01/30/2014 0405   ALKPHOS 47 01/30/2014 0405   BILITOT 0.2* 01/30/2014 0405   GFRNONAA 41* 01/30/2014 0405   GFRAA 48* 01/30/2014 0405   Lipase     Component Value Date/Time   LIPASE 101* 01/20/2014 0018        Studies/Results: Dg Chest Port 1 View  01/29/2014   CLINICAL DATA:  NG tube placement.  PICC line placement.  EXAM: PORTABLE CHEST - 1 VIEW  COMPARISON:  Chest x-ray 01/20/2014.  FINDINGS: The NG tube is coursing down the esophagus and into the stomach. The cardiac silhouette, mediastinal and hilar contours are stable. The lungs are clear. No pneumothorax. A prominent skin fold is noted over the right chest.  Re right-sided PICC line is noted. The tip is near the cavoatrial junction in the distal SVC. No complicating features.  IMPRESSION: Right PICC line tip is in the distal SVC.  NG tube is in the stomach.  No acute pulmonary findings.   Electronically Signed   By: Loralie ChampagneMark  Gallerani M.D.   On: 01/29/2014 18:39   Dg Abd 2 Views  01/29/2014   CLINICAL DATA:  Evaluate ileus.  EXAM: ABDOMEN - 2 VIEW  COMPARISON:  01/20/2014  FINDINGS: There are distended loops of small bowel throughout the abdomen with air-fluid levels on the decubitus view. No colonic distention is seen. Findings may reflect a diffuse small bowel adynamic ileus. Partial obstruction is possible. There is no free air.  Surgical staples lie along the anterior abdominal  wall midline. Changes from a cholecystectomy are stable.  IMPRESSION: Distended loops of small bowel with air-fluid levels consistent with either a diffuse small bowel adynamic ileus or a partial obstruction. No free air.   Electronically Signed   By: Amie Portlandavid  Ormond M.D.   On: 01/29/2014 09:34    Anti-infectives: Anti-infectives   Start     Dose/Rate Route Frequency Ordered Stop   01/20/14 0315  Ampicillin-Sulbactam (UNASYN) 3 g in sodium chloride 0.9 % 100 mL IVPB     3 g 100 mL/hr over 60 Minutes Intravenous  Once 01/20/14 0300 01/20/14 0520       Assessment/Plan 1. POD #10, ex lap for pneumoperitoneum, negative laparotomy  2. Post op ileus, started throwing up again  3. Bilateral PEs  4. Multiple lower extremity DVTs in left femoral vein and peroneal vein    Plan:  1. PICC/TNA, NPO, NG tube, bowel rest, IVF, pain control, antiemetics 2. Heme recommend OP Hematology workup and coumadin for anticoagulation for 6-12 months. Patient has multiple DVTs, PEs, despite being on chemical prophylaxis and mobilizing after surgery  3. EKG shows sinus tach with PVCs as I suspected. No further needs for this.  4. Resume PT/OT again and mobilization  5. KUB yesterday with ileus picture, NG replaced and 172350mL/24 hours 6. Hold on oral anticoagulant while NG in place 7. Filled in Dr. Renato Gailseed with what's been going on with her hospitalization.  She says she's been searching for a malignancy given her significant weight loss and now given this new hypercoagulopathy leading to PE's and DVT's.  She will be happy to follow her for her anticoagulation and we made he an appt with their NP on 02/14/14.  Dr. Renato Gailseed recommended Xarelto because she's worried about her compliance with medication with Coumadin.     LOS: 11 days    DORT, Aundra MilletMEGAN 01/30/2014, 10:55 AM Pager: [phone]

## 2014-01-30 NOTE — Progress Notes (Signed)
Physical Therapy Treatment Patient Details Name: Alicia ReapMary Smith MRN: [id number] DOB: 1936/01/03 Today's Date: 01/30/2014    History of Present Illness 78yo AAF admitted 6/27 with pneumoperitoneaum. Pt underwent an ex lap 01/20/14. Rapid response with pt syncope, patient found to have LLE DVT, Bilateral PEs.     PT Comments    Patient seen for therapy re-evaluation in step down unit. Patient demonstrates deficits in mobility as indicated, anticipate patient will progress well with mobility as she did prior to recent PE events. Will continue to see and progress as tolerated.    Follow Up Recommendations  Supervision/Assistance - 24 hour;No PT follow up     Equipment Recommendations   (shower chair)    Recommendations for Other Services       Precautions / Restrictions Precautions Precautions: Fall Restrictions Weight Bearing Restrictions: No    Mobility  Bed Mobility Overal bed mobility: Needs Assistance Bed Mobility: Supine to Sit Rolling: Supervision   Supine to sit: Min assist     General bed mobility comments: Min assist to pull to sitting position  Transfers Overall transfer level: Needs assistance Equipment used: Rolling walker (2 wheeled) Transfers: Sit to/from Stand Sit to Stand: Min guard Stand pivot transfers: Min guard       General transfer comment: v/c's for hand placement, increased time  Ambulation/Gait Ambulation/Gait assistance: Min guard Ambulation Distance (Feet): 110 Feet Assistive device: Rolling walker (2 wheeled) Gait Pattern/deviations: Step-to pattern;Decreased stride length;Trunk flexed;Narrow base of support Gait velocity: typical for abdominal surgery   General Gait Details: VCs for upright posture and increased cadence   Stairs            Wheelchair Mobility    Modified Rankin (Stroke Patients Only)       Balance     Sitting balance-Leahy Scale: Good       Standing balance-Leahy Scale: Poor                       Cognition Arousal/Alertness: Awake/alert Behavior During Therapy: WFL for tasks assessed/performed Overall Cognitive Status: Within Functional Limits for tasks assessed                      Exercises      General Comments General comments (skin integrity, edema, etc.): pt assisted to toilet with use of RW, VCs for hand placement and safety when standing from toilet, min guard for peri care      Pertinent Vitals/Pain VSS, reports minimal pain in abdominal area "gas pain"    Home Living                      Prior Function            PT Goals (current goals can now be found in the care plan section) Acute Rehab PT Goals Patient Stated Goal: to go home PT Goal Formulation: With patient Time For Goal Achievement: 01/29/14 Potential to Achieve Goals: Good Progress towards PT goals: Progressing toward goals    Frequency  Min 4X/week    PT Plan Current plan remains appropriate    Co-evaluation             End of Session Equipment Utilized During Treatment: Gait belt Activity Tolerance: Patient tolerated treatment well Patient left: with call bell/phone within reach;in bed     Time: 1202-1228 PT Time Calculation (min): 26 min  Charges:  $Gait Training: 8-22 mins $Therapeutic Activity: 8-22 mins  G CodesFabio Smith:      Alicia Smith 01/30/2014, 1:11 PM Alicia Smith, PT DPT  [phone]

## 2014-01-30 NOTE — Progress Notes (Addendum)
PARENTERAL NUTRITION CONSULT NOTE - f/u  Pharmacy Consult for tPN Indication: *  No Known Allergies  Patient Measurements: Height: 5\' 4"  (162.6 cm) Weight: 153 lb (69.4 kg) IBW/kg (Calculated) : 54.7 Adjusted Body Weight:  Usual Weight:   Vital Signs: Temp: 98.7 F (37.1 C) (07/08 0700) Temp src: Oral (07/08 0700) BP: 108/89 mmHg (07/08 0715) Pulse Rate: 139 (07/08 0715) Intake/Output from previous day: 07/07 0701 - 07/08 0700 In: 2438 [I.V.:1788; TPN:650] Out: 2000 [Urine:250; Emesis/NG output:1750] Intake/Output from this shift: Total I/O In: -  Out: 200 [Urine:200]  Labs:  Recent Labs  01/28/14 0840 01/29/14 0440 01/30/14 0405  WBC 9.6 9.3 8.3  HGB 11.0* 10.9* 9.9*  HCT 34.4* 33.8* 31.2*  PLT 298 319 281     Recent Labs  01/27/14 1555 01/30/14 0405  NA 137 137  K 4.5 3.9  CL 103 103  CO2 18* 22  GLUCOSE 150* 105*  BUN 16 16  CREATININE 1.23* 1.24*  CALCIUM 8.5 8.1*  MG  --  1.6  PHOS  --  3.3  PROT  --  5.3*  ALBUMIN  --  2.4*  AST  --  18  ALT  --  6  ALKPHOS  --  47  BILITOT  --  0.2*  TRIG  --  72   Estimated Creatinine Clearance: 36.9 ml/min (by C-G formula based on Cr of 1.24).    Recent Labs  01/29/14 2343 01/30/14 0402 01/30/14 0758  GLUCAP 110* 100* 93    Medical History: Past Medical History  Diagnosis Date  . High cholesterol   . Sleep apnea     HX OF APNEA LOST WEIGHT AND NO LONGER   . H/O hiatal hernia   . GERD (gastroesophageal reflux disease)   . Pernicious anemia     Medications:  Facility-administered medications prior to admission  Medication Dose Route Frequency Provider Last Rate Last Dose  . cyanocobalamin ((VITAMIN B-12)) injection 1,000 mcg  1,000 mcg Intramuscular Once Kermit Baloiffany L Reed, DO       Prescriptions prior to admission  Medication Sig Dispense Refill  . lansoprazole (PREVACID) 30 MG capsule Take 30 mg by mouth daily as needed (acid relfux).      . magnesium oxide (MAG-OX) 400 (241.3 MG) MG  tablet Take 1 tablet (400 mg total) by mouth 2 (two) times daily.  60 tablet  0  . Probiotic Product (PROBIOTIC DAILY PO) Take 1 capsule by mouth daily.      . ranitidine (ZANTAC) 150 MG tablet Take 150 mg by mouth as needed for heartburn.        Insulin Requirements in the past 24 hours:  None  Current Nutrition:  TPN at 6240ml/hr + Lipids at 7810ml/hr  Assessment:C/o epigastric/RUQ pain  78 y/o F admitted 01/19/2014.  She has had 2 prior admission for free air in abdomen with a negative w/u. S/p ex lap 01/20/14 for pneumoperitoneum, negative laparotomy. Continues to have decreased appetite as evidenced by >13% weight loss and moderate muscle and fat wasting per RD note.   GI: Post-op ileus. Continues to have N/V and distention. +BM 7/7. IV PPI for GERD. TPN for inadequate po intake since surgery 6/28. NG/Emesis: 1750 out. Triglycerides 72. Endo: No h/o DM. CBG 94-110 last 12 hrs.  Lytes: WNL Renal: Scr 1.24 stable. UOP only 250cc recorded. Pulm: RA Cards: HLD. BP ok but HR 89-129 Anticoag: Heparin for mult PE + R heart strain, multiple DVTs. Hepatobil: Lipase elevated on 6/28. Neuro:  ID: Afebrile. WBC 8.3. No abx. Best Practices: IV heparin, IV PPI, mouth care TPN Access: PICC placed 7/7 TPN day#:0   Nutritional Goals:  1650-1850 kCal, 75-90 grams of protein per day  Goal TPN: Clinimix E 5/15 at 5970ml/hr + 20% lipids at 4910ml/hr= 1673 kcal and 84g protein daily.  Plan:  Recheck lipase Decrease IVF with TPN rate increase. Increase Clinimix E 5/15 to 970ml/hr + lipids at 3810ml/hr   Cait Locust S. Merilynn Finlandobertson, PharmD, BCPS Clinical Staff Pharmacist Pager [phone]  Misty Stanleyobertson, Donica Derouin Stillinger 01/30/2014,9:35 AM

## 2014-01-30 NOTE — Progress Notes (Signed)
ANTICOAGULATION CONSULT NOTE - Follow up Consult  Pharmacy Consult for heparin Indication: pulmonary embolus  No Known Allergies Patient Measurements: Height: 5\' 4"  (162.6 cm) Weight: 153 lb (69.4 kg) IBW/kg (Calculated) : 54.7 Vital Signs: Temp: 98.7 F (37.1 C) (07/08 0700) Temp src: Oral (07/08 0700) BP: 108/89 mmHg (07/08 0715) Pulse Rate: 139 (07/08 0715) Labs:  Recent Labs  01/27/14 1555  01/28/14 0840 01/28/14 1930 01/29/14 0440 01/30/14 0405  HGB 10.1*  --  11.0*  --  10.9* 9.9*  HCT 31.8*  --  34.4*  --  33.8* 31.2*  PLT 310  --  298  --  319 281  HEPARINUNFRC  --   < > 0.27* 0.38 0.50 0.44  CREATININE 1.23*  --   --   --   --  1.24*  CKTOTAL 50  --   --   --   --   --   CKMB 2.8  --   --   --   --   --   TROPONINI 0.30*  --   --   --   --   --   < > = values in this interval not displayed. Estimated Creatinine Clearance: 36.9 ml/min (by C-G formula based on Cr of 1.24).  Medical History: Past Medical History  Diagnosis Date  . High cholesterol   . Sleep apnea     HX OF APNEA LOST WEIGHT AND NO LONGER   . H/O hiatal hernia   . GERD (gastroesophageal reflux disease)   . Pernicious anemia    Medications:  Facility-administered medications prior to admission  Medication Dose Route Frequency Provider Last Rate Last Dose  . cyanocobalamin ((VITAMIN B-12)) injection 1,000 mcg  1,000 mcg Intramuscular Once Kermit Baloiffany L Reed, DO       Prescriptions prior to admission  Medication Sig Dispense Refill  . lansoprazole (PREVACID) 30 MG capsule Take 30 mg by mouth daily as needed (acid relfux).      . magnesium oxide (MAG-OX) 400 (241.3 MG) MG tablet Take 1 tablet (400 mg total) by mouth 2 (two) times daily.  60 tablet  0  . Probiotic Product (PROBIOTIC DAILY PO) Take 1 capsule by mouth daily.      . ranitidine (ZANTAC) 150 MG tablet Take 150 mg by mouth as needed for heartburn.       Scheduled:  . antiseptic oral rinse  15 mL Mouth Rinse BID  . insulin aspart  0-9  Units Subcutaneous 4 times per day  . pantoprazole (PROTONIX) IV  40 mg Intravenous Q24H  . sodium chloride  10-40 mL Intracatheter Q12H   Infusions:  . dextrose 5 % and 0.45 % NaCl with KCl 10 mEq/L 60 mL/hr at 01/30/14 0700  . Marland Kitchen.TPN (CLINIMIX-E) Adult 40 mL/hr at 01/30/14 0700   And  . fat emulsion 500 kcal (01/30/14 0700)  . heparin 1,250 Units/hr (01/30/14 [id number])   Assessment: 78yo female admitted 6/27 for ischemic bowel, now s/p ex lap for pneumoperitoneum found to have acute PE on CT with evidence of right heart strain started on heparin infusion.   Heparin level is therapeutic at 0.44 but would ideally like to keep level above 0.5 due to acute submassive PE. Hgb has trended down slightly today. Plt stable. RN reports no s/s of bleeding.   Goal of Therapy:  Heparin level 0.3-0.7 units/ml (Aim for higher end of goal) Monitor platelets by anticoagulation protocol: Yes   Plan:  1) Increase heparin drip to 1250 units/hr 2) Monitor  daily HL, CBC, and s/s of bleeding  3) Holding off on oral anticoagulation while NG in place   Vinnie LevelBenjamin Rosie Torrez, PharmD.  Clinical Pharmacist Pager [phone]

## 2014-01-31 ENCOUNTER — Inpatient Hospital Stay (HOSPITAL_COMMUNITY): Payer: Medicare Other

## 2014-01-31 LAB — CBC
HCT: 27.9 % — ABNORMAL LOW (ref 36.0–46.0)
Hemoglobin: 8.9 g/dL — ABNORMAL LOW (ref 12.0–15.0)
MCH: 26.3 pg (ref 26.0–34.0)
MCHC: 31.9 g/dL (ref 30.0–36.0)
MCV: 82.5 fL (ref 78.0–100.0)
PLATELETS: 279 10*3/uL (ref 150–400)
RBC: 3.38 MIL/uL — AB (ref 3.87–5.11)
RDW: 14.6 % (ref 11.5–15.5)
WBC: 10.3 10*3/uL (ref 4.0–10.5)

## 2014-01-31 LAB — COMPREHENSIVE METABOLIC PANEL
ALBUMIN: 2.3 g/dL — AB (ref 3.5–5.2)
ALK PHOS: 47 U/L (ref 39–117)
ALT: <5 U/L (ref 0–35)
AST: 13 U/L (ref 0–37)
Anion gap: 11 (ref 5–15)
BUN: 18 mg/dL (ref 6–23)
CHLORIDE: 105 meq/L (ref 96–112)
CO2: 22 mEq/L (ref 19–32)
Calcium: 7.9 mg/dL — ABNORMAL LOW (ref 8.4–10.5)
Creatinine, Ser: 1.15 mg/dL — ABNORMAL HIGH (ref 0.50–1.10)
GFR calc Af Amer: 52 mL/min — ABNORMAL LOW (ref >90)
GFR calc non Af Amer: 45 mL/min — ABNORMAL LOW (ref >90)
Glucose, Bld: 121 mg/dL — ABNORMAL HIGH (ref 70–99)
POTASSIUM: 3.7 meq/L (ref 3.7–5.3)
SODIUM: 138 meq/L (ref 137–147)
Total Bilirubin: <0.2 mg/dL — ABNORMAL LOW (ref 0.3–1.2)
Total Protein: 5.4 g/dL — ABNORMAL LOW (ref 6.0–8.3)

## 2014-01-31 LAB — MAGNESIUM: MAGNESIUM: 1.7 mg/dL (ref 1.5–2.5)

## 2014-01-31 LAB — GLUCOSE, CAPILLARY
GLUCOSE-CAPILLARY: 122 mg/dL — AB (ref 70–99)
GLUCOSE-CAPILLARY: 123 mg/dL — AB (ref 70–99)
Glucose-Capillary: 116 mg/dL — ABNORMAL HIGH (ref 70–99)
Glucose-Capillary: 106 mg/dL — ABNORMAL HIGH (ref 70–99)
Glucose-Capillary: 109 mg/dL — ABNORMAL HIGH (ref 70–99)

## 2014-01-31 LAB — PHOSPHORUS: Phosphorus: 3.6 mg/dL (ref 2.3–4.6)

## 2014-01-31 LAB — LIPASE, BLOOD: LIPASE: 209 U/L — AB (ref 11–59)

## 2014-01-31 LAB — HEPARIN LEVEL (UNFRACTIONATED): Heparin Unfractionated: 0.47 IU/mL (ref 0.30–0.70)

## 2014-01-31 MED ORDER — TRACE MINERALS CR-CU-F-FE-I-MN-MO-SE-ZN IV SOLN
INTRAVENOUS | Status: AC
  Administered 2014-01-31: 17:00:00 via INTRAVENOUS
  Filled 2014-01-31: qty 2000

## 2014-01-31 MED ORDER — INSULIN ASPART 100 UNIT/ML ~~LOC~~ SOLN
0.0000 [IU] | Freq: Three times a day (TID) | SUBCUTANEOUS | Status: DC
  Administered 2014-02-01: 1 [IU] via SUBCUTANEOUS

## 2014-01-31 MED ORDER — FAT EMULSION 20 % IV EMUL
250.0000 mL | INTRAVENOUS | Status: AC
  Administered 2014-01-31: 250 mL via INTRAVENOUS
  Filled 2014-01-31: qty 250

## 2014-01-31 NOTE — Progress Notes (Signed)
PARENTERAL NUTRITION CONSULT NOTE - f/u  Pharmacy Consult for tPN Indication: Ileus  No Known Allergies  Patient Measurements: Height: 5\' 4"  (162.6 cm) Weight: 153 lb (69.4 kg) IBW/kg (Calculated) : 54.7 Adjusted Body Weight:  Usual Weight:   Vital Signs: Temp: 99 F (37.2 C) (07/09 0800) Temp src: Oral (07/09 0800) BP: 96/67 mmHg (07/09 0331) Pulse Rate: 91 (07/09 0800) Intake/Output from previous day: 07/08 0701 - 07/09 0700 In: 2950 [I.V.:1360; TPN:1590] Out: 3225 [Urine:1175; Emesis/NG output:2050] Intake/Output from this shift: Total I/O In: 122.5 [I.V.:42.5; TPN:80] Out: 200 [Urine:200]  Labs:  Recent Labs  01/29/14 0440 01/30/14 0405 01/31/14 0335  WBC 9.3 8.3 10.3  HGB 10.9* 9.9* 8.9*  HCT 33.8* 31.2* 27.9*  PLT 319 281 279     Recent Labs  01/30/14 0405 01/31/14 0335  NA 137 138  K 3.9 3.7  CL 103 105  CO2 22 22  GLUCOSE 105* 121*  BUN 16 18  CREATININE 1.24* 1.15*  CALCIUM 8.1* 7.9*  MG 1.6 1.7  PHOS 3.3 3.6  PROT 5.3* 5.4*  ALBUMIN 2.4* 2.3*  AST 18 13  ALT 6 <5  ALKPHOS 47 47  BILITOT 0.2* <0.2*  PREALBUMIN 7.8*  --   TRIG 72  --    Estimated Creatinine Clearance: 39.8 ml/min (by C-G formula based on Cr of 1.15).    Recent Labs  01/31/14 0343 01/31/14 0537 01/31/14 0736  GLUCAP 122* 116* 123*    Medical History: Past Medical History  Diagnosis Date  . High cholesterol   . Sleep apnea     HX OF APNEA LOST WEIGHT AND NO LONGER   . H/O hiatal hernia   . GERD (gastroesophageal reflux disease)   . Pernicious anemia     Medications:  Facility-administered medications prior to admission  Medication Dose Route Frequency Provider Last Rate Last Dose  . cyanocobalamin ((VITAMIN B-12)) injection 1,000 mcg  1,000 mcg Intramuscular Once Kermit Balo, DO       Prescriptions prior to admission  Medication Sig Dispense Refill  . lansoprazole (PREVACID) 30 MG capsule Take 30 mg by mouth daily as needed (acid relfux).      .  magnesium oxide (MAG-OX) 400 (241.3 MG) MG tablet Take 1 tablet (400 mg total) by mouth 2 (two) times daily.  60 tablet  0  . Probiotic Product (PROBIOTIC DAILY PO) Take 1 capsule by mouth daily.      . ranitidine (ZANTAC) 150 MG tablet Take 150 mg by mouth as needed for heartburn.        Insulin Requirements in the past 24 hours:  None  Current Nutrition:  TPN at 59ml/hr + Lipids at 75ml/hr  Assessment:C/o epigastric/RUQ pain  78 y/o F admitted 01/19/2014.  She has had 2 prior admission for free air in abdomen with a negative w/u. S/p ex lap 01/20/14 for pneumoperitoneum, negative laparotomy. Continues to have decreased appetite as evidenced by >13% weight loss and moderate muscle and fat wasting per RD note.   GI: Post-op ileus. Continues to have N/V and distention. +BM 7/7. IV PPI for GERD. TPN for inadequate po intake since surgery 6/28. NG/Emesis: up to 2050 out. Triglycerides 72. Lipase 101>>209 (pancreatitis vs gallbladder vs pancreatic tumor). Prealbumin low at 7.8  Endo: No h/o DM. CBG 93-129 last 12 hrs. Decrease CBG checks to q8hr.  Lytes: Ca 7.9 adjusts to 9.26  Renal: Scr 1.15 down. UOP 1175 (0.7) Pulm: RA Cards: HLD. BP low 96/67,but HR 89-139 Anticoag: Heparin for  mult PE + R heart strain, multiple DVTs. Heparin level 0.47 in goal range. Hgb is dropping Hepatobil: Lipase elevated on 6/28 101>>now 209 Neuro:  ID: Tmax 99. WBC 10.3 slightly up Heme: Hgb down 10.9>9.9>8.9. Plts stable Best Practices: IV heparin, IV PPI, mouth care TPN Access: PICC placed 7/7 TPN day#:2   Nutritional Goals:  1650-1850 kCal, 75-90 grams of protein per day  Goal TPN: Clinimix E 5/15 at 8170ml/hr + 20% lipids at 8410ml/hr= 1673 kcal and 84g protein daily.  Plan:  Check amylase Decrease CBG checks to every 8 hrs. Decrease IVF with TPN rate increase. Increase Clinimix E 5/15 to 1970ml/hr + lipids at 110ml/hr   [employer] S. Merilynn Finlandobertson, PharmD, BCPS Clinical Staff Pharmacist Pager  [phone]  Misty Stanleyobertson, Rachard Isidro Stillinger 01/31/2014,9:28 AM

## 2014-01-31 NOTE — Progress Notes (Signed)
Occupational Therapy Treatment Patient Details Name: Adrain Butrick MRN: [id number] DOB: 1936-05-10 Today's Date: 01/31/2014    History of present illness 78yo AAF admitted 6/27 with pneumoperitoneaum. Pt underwent an ex lap 01/20/14.   OT comments  Pt stating, "I can do all that, no problem," when requesting pt work on ADL with OT, but then stated the nurse tech was doing her bathing and dressing for her.  Educated pt on benefits of participating in activity and minimizing time in bed.  Pt verbalized understanding.  Follow Up Recommendations  Home health OT;Supervision/Assistance - 24 hour    Equipment Recommendations  3 in 1 bedside comode;Tub/shower seat    Recommendations for Other Services      Precautions / Restrictions Precautions Precautions: Fall Precaution Comments: NG tube Restrictions Weight Bearing Restrictions: No       Mobility Bed Mobility   Bed Mobility: Supine to Sit;Sit to Supine     Supine to sit: Supervision;HOB elevated Sit to supine: Min assist      Transfers     Transfers: Sit to/from [employer] Sit to Stand: Min guard Stand pivot transfers: Min guard            Balance                                   ADL Overall ADL's : Needs assistance/impaired Eating/Feeding: NPO   Grooming: Wash/dry hands;Sitting;Set up                   Toilet Transfer: Min guard;Stand-pivot;BSC   Toileting- Clothing Manipulation and Hygiene: Min guard;Sit to/from stand       Functional mobility during ADLs: Min guard General ADL Comments: pt requiring encouragement to participate, educated on the importance of participating in her bathing and dressing rather than relying on NT to do it all for her and getting up to [facility] or toilet rather than using the bedpan, pt verbalized understanding      Vision                     Perception     Praxis      Cognition   Behavior During Therapy: [facility] for tasks  assessed/performed Overall Cognitive Status: Within Functional Limits for tasks assessed                       Extremity/Trunk Assessment               Exercises     Shoulder Instructions       General Comments      Pertinent Vitals/ Pain       No pain, VSS  Home Living                                          Prior Functioning/Environment              Frequency Min 2X/week     Progress Toward Goals  OT Goals(current goals can now be found in the care plan section)  Progress towards OT goals: Progressing toward goals     Plan Discharge plan remains appropriate    Co-evaluation                 End of Session     Activity  Tolerance Patient tolerated treatment well   Patient Left in bed;with call bell/phone within reach   Nurse Communication          Time: [id number] OT Time Calculation (min): 15 min  Charges: OT General Charges $OT Visit: 1 Procedure OT Treatments $Self Care/Home Management : 8-22 mins  Evern BioMayberry, Shakila Mak Lynn 01/31/2014, 10:25 AM [phone]

## 2014-01-31 NOTE — Progress Notes (Signed)
[facility] Progress Note  11 Days Post-Op  Subjective: Pt feels much better.  Says nausea/vomiting resolved.  Abdominal pain and distension improved.  Passed some flatus, no BM yet.  Wants NG tube out because its irritating to her.    Objective: Vital signs in last 24 hours: Temp:  [98.2 F (36.8 C)-99 F (37.2 C)] 99 F (37.2 C) (07/09 0800) Pulse Rate:  [89-96] 92 (07/09 1000) Resp:  [14-21] 19 (07/09 1000) BP: (91-128)/(59-72) 96/67 mmHg (07/09 0331) SpO2:  [96 %-100 %] 100 % (07/09 1000) Last BM Date: 01/29/14  Intake/Output from previous day: 07/08 0701 - 07/09 0700 In: 2950 [I.V.:1360; TPN:1590] Out: 3225 [Urine:1175; Emesis/NG output:2050] Intake/Output this shift: Total I/O In: 245.5 [I.V.:85.5; TPN:160] Out: 375 [Urine:375]  PE: Gen: Alert, NAD, pleasant  Abd: Soft, less distended, minimally tender, improved BS today, no HSM, incisions C/D/I with staples in place, NG output green 208850mL/24 hr   Lab Results:   Recent Labs  01/30/14 0405 01/31/14 0335  WBC 8.3 10.3  HGB 9.9* 8.9*  HCT 31.2* 27.9*  PLT 281 279   BMET  Recent Labs  01/30/14 0405 01/31/14 0335  NA 137 138  K 3.9 3.7  CL 103 105  CO2 22 22  GLUCOSE 105* 121*  BUN 16 18  CREATININE 1.24* 1.15*  CALCIUM 8.1* 7.9*   PT/INR No results found for this basename: LABPROT, INR,  in the last 72 hours CMP     Component Value Date/Time   NA 138 01/31/2014 0335   NA 141 01/03/2014 1405   K 3.7 01/31/2014 0335   CL 105 01/31/2014 0335   CO2 22 01/31/2014 0335   GLUCOSE 121* 01/31/2014 0335   GLUCOSE 78 01/03/2014 1405   BUN 18 01/31/2014 0335   BUN 18 01/03/2014 1405   CREATININE 1.15* 01/31/2014 0335   CALCIUM 7.9* 01/31/2014 0335   PROT 5.4* 01/31/2014 0335   PROT 6.2 01/03/2014 1405   ALBUMIN 2.3* 01/31/2014 0335   AST 13 01/31/2014 0335   ALT <5 01/31/2014 0335   ALKPHOS 47 01/31/2014 0335   BILITOT <0.2* 01/31/2014 0335   GFRNONAA 45* 01/31/2014 0335   GFRAA 52* 01/31/2014 0335   Lipase      Component Value Date/Time   LIPASE 209* 01/31/2014 0335       Studies/Results: Dg Chest Port 1 View  01/29/2014   CLINICAL DATA:  NG tube placement.  PICC line placement.  EXAM: PORTABLE CHEST - 1 VIEW  COMPARISON:  Chest x-ray 01/20/2014.  FINDINGS: The NG tube is coursing down the esophagus and into the stomach. The cardiac silhouette, mediastinal and hilar contours are stable. The lungs are clear. No pneumothorax. A prominent skin fold is noted over the right chest.  Re right-sided PICC line is noted. The tip is near the cavoatrial junction in the distal SVC. No complicating features.  IMPRESSION: Right PICC line tip is in the distal SVC.  NG tube is in the stomach.  No acute pulmonary findings.   Electronically Signed   By: Loralie ChampagneMark  Gallerani M.D.   On: 01/29/2014 18:39    Anti-infectives: Anti-infectives   Start     Dose/Rate Route Frequency Ordered Stop   01/20/14 0315  Ampicillin-Sulbactam (UNASYN) 3 g in sodium chloride 0.9 % 100 mL IVPB     3 g 100 mL/hr over 60 Minutes Intravenous  Once 01/20/14 0300 01/20/14 0520       Assessment/Plan 1. POD #11, ex lap for pneumoperitoneum, negative laparotomy  2. Post op ileus, started throwing up again  3. Bilateral PEs  4. Multiple lower extremity DVTs in left femoral vein and peroneal vein   Plan:  1. PICC/TNA, NPO, NG tube, bowel rest, IVF, pain control, antiemetics  2. Heme recommend OP Hematology workup and coumadin for anticoagulation for 6-12 months. Patient has multiple DVTs, PEs, despite being on chemical prophylaxis and mobilizing after surgery  3. EKG shows sinus tach with PVCs as I suspected. No further needs for this.  4. Resume PT/OT again and mobilization  5. KUB yesterday with ileus picture, NG replaced and 2052mL/24 hours  6. Hold on oral anticoagulant while NG in place, on heparin drip for now 7. Filled in Dr. Renato Gails with what's been going on with her hospitalization. She says she's been searching for a malignancy given  her significant weight loss and now given this new hypercoagulopathy leading to PE's and DVT's. She will be happy to follow her for her anticoagulation and we made he an appt with their NP on 02/14/14. Dr. Renato Gails recommended Xarelto because she's worried about her compliance with medication with Coumadin. 8.  KUB shows improvement from 01/29/14, if NG output doesn't improve, may need CT scan, will discuss with Dr. Corliss Skains, but clinically she's improving despite high NG output 9.  Transfer out of SDU? If floor can take on heparin drip    LOS: 12 days    DORT, Ronav Furney 01/31/2014, 10:35 AM Pager: [phone]

## 2014-01-31 NOTE — Progress Notes (Signed)
Physical Therapy Treatment Patient Details Name: Alicia Smith MRN: [id number] DOB: 02/25/1936 Today's Date: 01/31/2014    History of Present Illness 78yo AAF admitted 6/27 with pneumoperitoneaum. Pt underwent an ex lap 01/20/14.    PT Comments    Patient agreeable to mobility, tolerated well, no pain. Continue with POC.  Follow Up Recommendations  Supervision/Assistance - 24 hour;No PT follow up     Equipment Recommendations       Recommendations for Other Services       Precautions / Restrictions Precautions Precautions: Fall Precaution Comments: NG tube Restrictions Weight Bearing Restrictions: No    Mobility  Bed Mobility Overal bed mobility: Needs Assistance Bed Mobility: Rolling;Supine to Sit Rolling: Supervision   Supine to sit: Supervision     General bed mobility comments: able to perform without physical assist today  Transfers Overall transfer level: Needs assistance Equipment used: Rolling walker (2 wheeled) Transfers: Sit to/from [employer];Stand Pivot Transfers Sit to Stand: Min guard Stand pivot transfers: Min guard       General transfer comment: from various surfaces including bed, chair and toilet  Ambulation/Gait Ambulation/Gait assistance: Supervision Ambulation Distance (Feet): 120 Feet Assistive device: Rolling walker (2 wheeled) Gait Pattern/deviations: Step-to pattern;Decreased stride length;Trunk flexed;Narrow base of support Gait velocity: decreased modestly but improving compared to prior session   General Gait Details: VCs for upright posture and increased cadence   Stairs            Wheelchair Mobility    Modified Rankin (Stroke Patients Only)       Balance     Sitting balance-Leahy Scale: Good       Standing balance-Leahy Scale: Fair                      Cognition Arousal/Alertness: Awake/alert Behavior During Therapy: WFL for tasks assessed/performed Overall Cognitive Status: Within Functional  Limits for tasks assessed                      Exercises      General Comments        Pertinent Vitals/Pain No pain today, VSS    Home Living                      Prior Function            PT Goals (current goals can now be found in the care plan section) Acute Rehab PT Goals Patient Stated Goal: to go home PT Goal Formulation: With patient Time For Goal Achievement: 01/29/14 Potential to Achieve Goals: Good Progress towards PT goals: Progressing toward goals    Frequency  Min 4X/week    PT Plan Current plan remains appropriate    Co-evaluation             End of Session Equipment Utilized During Treatment: Gait belt Activity Tolerance: Patient tolerated treatment well Patient left: with call bell/phone within reach;in bed     Time: [id number] PT Time Calculation (min): 23 min  Charges:  $Gait Training: 8-22 mins $Therapeutic Activity: 8-22 mins                    G CodesFabio Asa:      Fraidy Mccarrick J 01/31/2014, 4:21 PM Charlotte Crumbevon Bertie Simien, PT DPT  [phone]

## 2014-01-31 NOTE — Progress Notes (Signed)
Utilization review completed.  

## 2014-01-31 NOTE — Progress Notes (Signed)
Abdomen softer with NG tube, but still no flatus or BM   Dg Abd Portable 1v  01/31/2014   CLINICAL DATA:  Postoperative ileus  EXAM: PORTABLE ABDOMEN - 1 VIEW  COMPARISON:  01/29/2014  FINDINGS: Gaseous distention of bowel loops in the mid abdomen, decreased since previous exam.  Small amount of stool and retained contrast in colon with gas and stool in rectum.  Stomach decompressed by nasogastric tube.  Ventral surgical clips post laparotomy.  RIGHT upper quadrant surgical clips likely cholecystectomy.  No bowel wall thickening.  IMPRESSION: Decreased gaseous distention of bowel loops since previous exam.   Electronically Signed   By: Ulyses SouthwardMark  Boles M.D.   On: 01/31/2014 11:31     Post-operative ileus.  Continue NG tube, NPO, TNA If no improvement, will consider CT scan with contrast tomorrow.  Alicia ArmsMatthew K. Corliss Skainssuei, MD, [facility]  General/ Trauma Surgery  01/31/2014 3:06 PM

## 2014-02-01 ENCOUNTER — Encounter (HOSPITAL_COMMUNITY): Payer: Self-pay | Admitting: Radiology

## 2014-02-01 ENCOUNTER — Inpatient Hospital Stay (HOSPITAL_COMMUNITY): Payer: Medicare Other

## 2014-02-01 DIAGNOSIS — E46 Unspecified protein-calorie malnutrition: Secondary | ICD-10-CM

## 2014-02-01 LAB — CBC
HCT: 26.2 % — ABNORMAL LOW (ref 36.0–46.0)
Hemoglobin: 8.5 g/dL — ABNORMAL LOW (ref 12.0–15.0)
MCH: 26.6 pg (ref 26.0–34.0)
MCHC: 32.4 g/dL (ref 30.0–36.0)
MCV: 81.9 fL (ref 78.0–100.0)
Platelets: 286 10*3/uL (ref 150–400)
RBC: 3.2 MIL/uL — ABNORMAL LOW (ref 3.87–5.11)
RDW: 14.7 % (ref 11.5–15.5)
WBC: 10.8 10*3/uL — AB (ref 4.0–10.5)

## 2014-02-01 LAB — LIPASE, BLOOD: Lipase: 104 U/L — ABNORMAL HIGH (ref 11–59)

## 2014-02-01 LAB — HEPARIN LEVEL (UNFRACTIONATED): Heparin Unfractionated: 0.6 IU/mL (ref 0.30–0.70)

## 2014-02-01 LAB — GLUCOSE, CAPILLARY
Glucose-Capillary: 113 mg/dL — ABNORMAL HIGH (ref 70–99)
Glucose-Capillary: 119 mg/dL — ABNORMAL HIGH (ref 70–99)
Glucose-Capillary: 123 mg/dL — ABNORMAL HIGH (ref 70–99)

## 2014-02-01 LAB — AMYLASE: Amylase: 123 U/L — ABNORMAL HIGH (ref 0–105)

## 2014-02-01 MED ORDER — IOHEXOL 300 MG/ML  SOLN
25.0000 mL | INTRAMUSCULAR | Status: AC
  Administered 2014-02-01 (×2): 25 mL via ORAL

## 2014-02-01 MED ORDER — TRACE MINERALS CR-CU-F-FE-I-MN-MO-SE-ZN IV SOLN
INTRAVENOUS | Status: AC
  Administered 2014-02-01: 17:00:00 via INTRAVENOUS
  Filled 2014-02-01: qty 2000

## 2014-02-01 MED ORDER — IOHEXOL 300 MG/ML  SOLN
100.0000 mL | Freq: Once | INTRAMUSCULAR | Status: AC | PRN
  Administered 2014-02-01: 100 mL via INTRAVENOUS

## 2014-02-01 MED ORDER — FAT EMULSION 20 % IV EMUL
250.0000 mL | INTRAVENOUS | Status: AC
  Administered 2014-02-01: 250 mL via INTRAVENOUS
  Filled 2014-02-01: qty 250

## 2014-02-01 NOTE — Progress Notes (Signed)
PARENTERAL NUTRITION CONSULT NOTE - f/u  Pharmacy Consult for tPN Indication: Ileus  No Known Allergies  Patient Measurements: Height: 5\' 4"  (162.6 cm) Weight: 153 lb (69.4 kg) IBW/kg (Calculated) : 54.7  Vital Signs: Temp: 98.5 F (36.9 C) (07/10 0737) Temp src: Oral (07/10 0737) BP: 97/66 mmHg (07/10 0737) Pulse Rate: 101 (07/10 0737) Intake/Output from previous day: 07/09 0701 - 07/10 0700 In: 2818.5 [I.V.:988.5; TPN:1830] Out: 1275 [Urine:375; Emesis/NG output:900] Intake/Output from this shift: Total I/O In: 123 [I.V.:43; TPN:80] Out: 100 [Emesis/NG output:100]  Labs:  Recent Labs  01/30/14 0405 01/31/14 0335 02/01/14 0500  WBC 8.3 10.3 10.8*  HGB 9.9* 8.9* 8.5*  HCT 31.2* 27.9* 26.2*  PLT 281 279 286    Recent Labs  01/30/14 0405 01/31/14 0335  NA 137 138  K 3.9 3.7  CL 103 105  CO2 22 22  GLUCOSE 105* 121*  BUN 16 18  CREATININE 1.24* 1.15*  CALCIUM 8.1* 7.9*  MG 1.6 1.7  PHOS 3.3 3.6  PROT 5.3* 5.4*  ALBUMIN 2.4* 2.3*  AST 18 13  ALT 6 <5  ALKPHOS 47 47  BILITOT 0.2* <0.2*  PREALBUMIN 7.8*  --   TRIG 72  --    Estimated Creatinine Clearance: 39.8 ml/min (by C-G formula based on Cr of 1.15).   Recent Labs  01/31/14 1340 01/31/14 2143 02/01/14 0511  GLUCAP 106* 109* 113*   Medical History: Past Medical History  Diagnosis Date  . High cholesterol   . Sleep apnea     HX OF APNEA LOST WEIGHT AND NO LONGER   . H/O hiatal hernia   . GERD (gastroesophageal reflux disease)   . Pernicious anemia    Medications:  Facility-administered medications prior to admission  Medication Dose Route Frequency Provider Last Rate Last Dose  . cyanocobalamin ((VITAMIN B-12)) injection 1,000 mcg  1,000 mcg Intramuscular Once Kermit Baloiffany L Reed, DO       Prescriptions prior to admission  Medication Sig Dispense Refill  . lansoprazole (PREVACID) 30 MG capsule Take 30 mg by mouth daily as needed (acid relfux).      . magnesium oxide (MAG-OX) 400 (241.3  MG) MG tablet Take 1 tablet (400 mg total) by mouth 2 (two) times daily.  60 tablet  0  . Probiotic Product (PROBIOTIC DAILY PO) Take 1 capsule by mouth daily.      . ranitidine (ZANTAC) 150 MG tablet Take 150 mg by mouth as needed for heartburn.       Insulin Requirements in the past 24 hours:  None  Current Nutrition:  TPN at 8970ml/hr + Lipids at 4510ml/hr  Assessment:C/o epigastric/RUQ pain  78 y/o F admitted 01/19/2014.  She has had 2 prior admission for free air in abdomen with a negative w/u. S/p ex lap 01/20/14 for pneumoperitoneum, negative laparotomy. Continues to have decreased appetite as evidenced by >13% weight loss and moderate muscle and fat wasting per RD note.   GI: Post-op ileus. Continues to have N/V and distention. +BM 7/7. IV PPI for GERD. TPN for inadequate po intake since surgery 6/28. NG/Emesis: up to 2050 out. Triglycerides 72. Prealbumin is low at 7.8  Endo: No h/o DM. CBG 106-113 last 12 hrs. Decrease CBG checks to q8hr.  Lytes: No new labs today - will continue to monitor  Renal: Scr stable with good UOP Pulm: RA Cards: HLD. BP low 96/67,but having some ST this morning with HR 100's Anticoag: Heparin for mult PE + R heart strain, multiple DVTs. Heparin  level 0.60 in goal range. Hgb is dropping but platelets are stable Hepatobil: Lipase trending down (104) Neuro: No acute issues noted ID: Afebrile - WBC - 10.8 Heme: Hgb down 10.9>9.9>8.9. Plts stable Best Practices: IV heparin, IV PPI, mouth care TPN Access: PICC placed 7/7  TPN day#:3  Nutritional Goals:  1650-1850 kCal, 75-90 grams of protein per day  Goal TPN: Clinimix E 5/15 at 72ml/hr + 20% lipids at 17ml/hr= 1673 kcal and 84g protein daily.  Plan:  Continue CBG checks to every 8 hrs. Continue IVF  Continue Clinimix E 5/15 to 40ml/hr + lipids at 52ml/hr  Nadara Mustard, PharmD., MS Clinical Pharmacist Pager:  [phone] Thank you for allowing pharmacy to be part of this patients care  team. 02/01/2014,9:11 AM

## 2014-02-01 NOTE — Progress Notes (Signed)
Patient ID: Alicia Smith, female   DOB: Dec 24, 1935, 78 y.o.   MRN: [id number]  Subjective: Wants NGT out, 942m of output.  Passing flatus, no BM.  No n/v.  Lipase is down. Afebrile.  VSS.  States she is getting OOB with walker.   Objective:  Vital signs:  Filed Vitals:   01/31/14 2008 01/31/14 2320 02/01/14 0456 02/01/14 0737  BP: 112/74 117/72 98/68 97/66   Pulse: 95 98 93 101  Temp: 98 F (36.7 C) 98.1 F (36.7 C) 97.9 F (36.6 C) 98.5 F (36.9 C)  TempSrc: Oral Oral Oral Oral  Resp: 17 14 15 17   Height:      Weight:      SpO2: 100% 100% 100% 100%    Last BM Date: 01/29/14  Intake/Output   Yesterday:  07/09 0701 - 07/10 0700 In: 2818.5 [I.V.:988.5; TPN:1830] Out: 17741[Urine:375; Emesis/NG output:900] This shift:  Total I/O In: 123 [I.V.:43; TPN:80] Out: 100 [Emesis/NG output:100]   Physical Exam: General: Pt awake/alert/oriented x4 in acute distress Chest: cta.  No chest wall pain w good excursion CV:  Pulses intact.  Regular rhythm MS: Normal AROM mjr joints.  No obvious deformity Abdomen: Soft.  Nondistended.  +bs.  Non tender.  Midline incision-staples in place, wound edges are approximated, no erythema.   No evidence of peritonitis.  No incarcerated hernias. Ext:  SCDs BLE.  No mjr edema.  No cyanosis Skin: No petechiae / purpura   Problem List:   Active Problems:   Pneumoperitoneum   AKI (acute kidney injury)   Malnutrition of moderate degree    Results:   Labs: Results for orders placed during the hospital encounter of 01/19/14 (from the past 48 hour(s))  GLUCOSE, CAPILLARY     Status: None   Collection Time    01/30/14 11:13 AM      Result Value Ref Range   Glucose-Capillary 84  70 - 99 mg/dL  GLUCOSE, CAPILLARY     Status: Abnormal   Collection Time    01/30/14  3:41 PM      Result Value Ref Range   Glucose-Capillary 119 (*) 70 - 99 mg/dL   Comment 1 Documented in Chart     Comment 2 Notify RN    GLUCOSE, CAPILLARY     Status:  Abnormal   Collection Time    01/30/14  8:18 PM      Result Value Ref Range   Glucose-Capillary 129 (*) 70 - 99 mg/dL  GLUCOSE, CAPILLARY     Status: Abnormal   Collection Time    01/30/14 11:39 PM      Result Value Ref Range   Glucose-Capillary 119 (*) 70 - 99 mg/dL  CBC     Status: Abnormal   Collection Time    01/31/14  3:35 AM      Result Value Ref Range   WBC 10.3  4.0 - 10.5 K/uL   RBC 3.38 (*) 3.87 - 5.11 MIL/uL   Hemoglobin 8.9 (*) 12.0 - 15.0 g/dL   HCT 27.9 (*) 36.0 - 46.0 %   MCV 82.5  78.0 - 100.0 fL   MCH 26.3  26.0 - 34.0 pg   MCHC 31.9  30.0 - 36.0 g/dL   RDW 14.6  11.5 - 15.5 %   Platelets 279  150 - 400 K/uL  LIPASE, BLOOD     Status: Abnormal   Collection Time    01/31/14  3:35 AM      Result Value Ref Range  Lipase 209 (*) 11 - 59 U/L  COMPREHENSIVE METABOLIC PANEL     Status: Abnormal   Collection Time    01/31/14  3:35 AM      Result Value Ref Range   Sodium 138  137 - 147 mEq/L   Potassium 3.7  3.7 - 5.3 mEq/L   Chloride 105  96 - 112 mEq/L   CO2 22  19 - 32 mEq/L   Glucose, Bld 121 (*) 70 - 99 mg/dL   BUN 18  6 - 23 mg/dL   Creatinine, Ser 1.15 (*) 0.50 - 1.10 mg/dL   Calcium 7.9 (*) 8.4 - 10.5 mg/dL   Total Protein 5.4 (*) 6.0 - 8.3 g/dL   Albumin 2.3 (*) 3.5 - 5.2 g/dL   AST 13  0 - 37 U/L   ALT <5  0 - 35 U/L   Alkaline Phosphatase 47  39 - 117 U/L   Total Bilirubin <0.2 (*) 0.3 - 1.2 mg/dL   GFR calc non Af Amer 45 (*) >90 mL/min   GFR calc Af Amer 52 (*) >90 mL/min   Comment: (NOTE)     The eGFR has been calculated using the CKD EPI equation.     This calculation has not been validated in all clinical situations.     eGFR's persistently <90 mL/min signify possible Chronic Kidney     Disease.   Anion gap 11  5 - 15  MAGNESIUM     Status: None   Collection Time    01/31/14  3:35 AM      Result Value Ref Range   Magnesium 1.7  1.5 - 2.5 mg/dL  PHOSPHORUS     Status: None   Collection Time    01/31/14  3:35 AM      Result Value  Ref Range   Phosphorus 3.6  2.3 - 4.6 mg/dL  GLUCOSE, CAPILLARY     Status: Abnormal   Collection Time    01/31/14  3:43 AM      Result Value Ref Range   Glucose-Capillary 122 (*) 70 - 99 mg/dL  HEPARIN LEVEL (UNFRACTIONATED)     Status: None   Collection Time    01/31/14  4:38 AM      Result Value Ref Range   Heparin Unfractionated 0.47  0.30 - 0.70 IU/mL   Comment:            IF HEPARIN RESULTS ARE BELOW     EXPECTED VALUES, AND PATIENT     DOSAGE HAS BEEN CONFIRMED,     SUGGEST FOLLOW UP TESTING     OF ANTITHROMBIN III LEVELS.  GLUCOSE, CAPILLARY     Status: Abnormal   Collection Time    01/31/14  5:37 AM      Result Value Ref Range   Glucose-Capillary 116 (*) 70 - 99 mg/dL  GLUCOSE, CAPILLARY     Status: Abnormal   Collection Time    01/31/14  7:36 AM      Result Value Ref Range   Glucose-Capillary 123 (*) 70 - 99 mg/dL   Comment 1 Documented in Chart     Comment 2 Notify RN    GLUCOSE, CAPILLARY     Status: Abnormal   Collection Time    01/31/14  1:40 PM      Result Value Ref Range   Glucose-Capillary 106 (*) 70 - 99 mg/dL  GLUCOSE, CAPILLARY     Status: Abnormal   Collection Time    01/31/14  9:43 PM      Result Value Ref Range   Glucose-Capillary 109 (*) 70 - 99 mg/dL  HEPARIN LEVEL (UNFRACTIONATED)     Status: None   Collection Time    02/01/14  5:00 AM      Result Value Ref Range   Heparin Unfractionated 0.60  0.30 - 0.70 IU/mL   Comment:            IF HEPARIN RESULTS ARE BELOW     EXPECTED VALUES, AND PATIENT     DOSAGE HAS BEEN CONFIRMED,     SUGGEST FOLLOW UP TESTING     OF ANTITHROMBIN III LEVELS.  CBC     Status: Abnormal   Collection Time    02/01/14  5:00 AM      Result Value Ref Range   WBC 10.8 (*) 4.0 - 10.5 K/uL   RBC 3.20 (*) 3.87 - 5.11 MIL/uL   Hemoglobin 8.5 (*) 12.0 - 15.0 g/dL   HCT 26.2 (*) 36.0 - 46.0 %   MCV 81.9  78.0 - 100.0 fL   MCH 26.6  26.0 - 34.0 pg   MCHC 32.4  30.0 - 36.0 g/dL   RDW 14.7  11.5 - 15.5 %   Platelets  286  150 - 400 K/uL  LIPASE, BLOOD     Status: Abnormal   Collection Time    02/01/14  5:00 AM      Result Value Ref Range   Lipase 104 (*) 11 - 59 U/L  AMYLASE     Status: Abnormal   Collection Time    02/01/14  5:00 AM      Result Value Ref Range   Amylase 123 (*) 0 - 105 U/L  GLUCOSE, CAPILLARY     Status: Abnormal   Collection Time    02/01/14  5:11 AM      Result Value Ref Range   Glucose-Capillary 113 (*) 70 - 99 mg/dL    Imaging / Studies: Dg Abd Portable 1v  01/31/2014   CLINICAL DATA:  Postoperative ileus  EXAM: PORTABLE ABDOMEN - 1 VIEW  COMPARISON:  01/29/2014  FINDINGS: Gaseous distention of bowel loops in the mid abdomen, decreased since previous exam.  Small amount of stool and retained contrast in colon with gas and stool in rectum.  Stomach decompressed by nasogastric tube.  Ventral surgical clips post laparotomy.  RIGHT upper quadrant surgical clips likely cholecystectomy.  No bowel wall thickening.  IMPRESSION: Decreased gaseous distention of bowel loops since previous exam.   Electronically Signed   By: Lavonia Dana M.D.   On: 01/31/2014 11:31   9:51 AM   Antibiotics: Anti-infectives   Start     Dose/Rate Route Frequency Ordered Stop   01/20/14 0315  Ampicillin-Sulbactam (UNASYN) 3 g in sodium chloride 0.9 % 100 mL IVPB     3 g 100 mL/hr over 60 Minutes Intravenous  Once 01/20/14 0300 01/20/14 0520      Assessment/Plan  POD #12, s/p exploratory laparotomy for pneumoperitoneum, negative lap---Dr. Redmond Pulling 6/28 Post op ileus PCM -continue NGT decompression and await bowel function.  Passing flatus, 939m/24h NGT, no BM yet.  Lipase up, etiology is unclear.  Given lack of improvement, we will proceed with a CT of abdomen and pelvis.  -IS -mobilize, PT/OT -TPN -Heme recommend OP Hematology workup and coumadin for anticoagulation for 6-12 months. Patient has multiple DVTs, PEs, despite being on chemical prophylaxis and mobilizing after surgery  -DC staples and  apply steristrips Acute renal  insufficiency  -stable, repeat labs in AM since she is getting a CT with contrast today. Bilateral PEs  Multiple lower extremity DVTs in left femoral vein and peroneal vein -Heparin gtt per pharmacy -outpatient follow up with Dr. Reed(PCP) who is a ware of ew hypercoagulopathy leading to PE's and DVT's. She will be happy to follow her for her anticoagulation and we made he an appt with their NP on 02/14/14. Dr. Mariea Clonts recommended Xarelto because she's worried about her compliance with medication with Coumadin.   Erby Pian, [facility] Pager [phone] Office [phone]  02/01/2014 9:51 AM

## 2014-02-01 NOTE — Progress Notes (Signed)
ANTICOAGULATION CONSULT NOTE - Follow up Consult  Pharmacy Consult for heparin Indication: pulmonary embolus  No Known Allergies Patient Measurements: Height: 5\' 4"  (162.6 cm) Weight: 153 lb (69.4 kg) IBW/kg (Calculated) : 54.7 Vital Signs: Temp: 97.9 F (36.6 C) (07/10 0456) Temp src: Oral (07/10 0456) BP: 98/68 mmHg (07/10 0456) Pulse Rate: 93 (07/10 0456) Labs:  Recent Labs  01/30/14 0405 01/31/14 0335 01/31/14 0438 02/01/14 0500  HGB 9.9* 8.9*  --  8.5*  HCT 31.2* 27.9*  --  26.2*  PLT 281 279  --  286  HEPARINUNFRC 0.44  --  0.47 0.60  CREATININE 1.24* 1.15*  --   --    Estimated Creatinine Clearance: 39.8 ml/min (by C-G formula based on Cr of 1.15).  Medical History: Past Medical History  Diagnosis Date  . High cholesterol   . Sleep apnea     HX OF APNEA LOST WEIGHT AND NO LONGER   . H/O hiatal hernia   . GERD (gastroesophageal reflux disease)   . Pernicious anemia    Medications:  Facility-administered medications prior to admission  Medication Dose Route Frequency Provider Last Rate Last Dose  . cyanocobalamin ((VITAMIN B-12)) injection 1,000 mcg  1,000 mcg Intramuscular Once Kermit Baloiffany L Reed, DO       Prescriptions prior to admission  Medication Sig Dispense Refill  . lansoprazole (PREVACID) 30 MG capsule Take 30 mg by mouth daily as needed (acid relfux).      . magnesium oxide (MAG-OX) 400 (241.3 MG) MG tablet Take 1 tablet (400 mg total) by mouth 2 (two) times daily.  60 tablet  0  . Probiotic Product (PROBIOTIC DAILY PO) Take 1 capsule by mouth daily.      . ranitidine (ZANTAC) 150 MG tablet Take 150 mg by mouth as needed for heartburn.       Scheduled:  . antiseptic oral rinse  15 mL Mouth Rinse BID  . insulin aspart  0-9 Units Subcutaneous 3 times per day  . pantoprazole (PROTONIX) IV  40 mg Intravenous Q24H  . sodium chloride  10-40 mL Intracatheter Q12H   Infusions:  . dextrose 5 % and 0.45 % NaCl with KCl 10 mEq/L 30 mL/hr at 02/01/14 0600   . Marland Kitchen.TPN (CLINIMIX-E) Adult 70 mL/hr at 02/01/14 0600   And  . fat emulsion 500 kcal (02/01/14 0600)  . heparin 1,300 Units/hr (02/01/14 0600)   Assessment: 78yo female admitted 6/27 for ischemic bowel, now s/p ex lap for pneumoperitoneum found to have acute PE on CT with evidence of right heart strain started on heparin infusion.   Heparin level remains therapeutic at 0.6 and is in upper goal range as desired due to acute submassive PE. Hgb continues to trend down. Plt stable. RN reports no s/s of bleeding.   Goal of Therapy:  Heparin level 0.3-0.7 units/ml (Aim for higher end of goal) Monitor platelets by anticoagulation protocol: Yes   Plan:  1) Continue heparin drip at 1300 units/hr 2) Monitor daily HL, CBC, and s/s of bleeding  3) It seems plan is to transition to NOAC once NG tube is out - Dr Renato Gailseed recommends Xarelto    Alicia Smith, PharmD.  Clinical Pharmacist Pager [phone]

## 2014-02-01 NOTE — Progress Notes (Signed)
Prolonged ileus - CT scan today.  Wilmon ArmsMatthew K. Corliss Skainssuei, MD, [facility]  General/ Trauma Surgery  02/01/2014 2:03 PM

## 2014-02-01 NOTE — Progress Notes (Signed)
PT Cancellation Note  Patient Details Name: Alicia Smith MRN: [id number] DOB: 1935-12-02   Cancelled Treatment:    Reason Eval/Treat Not Completed: Patient at procedure or test/unavailable;Patient declined, no reason specified Attempted to see x3  Fabio AsaWerner, Mylee Falin J 02/01/2014, 5:22 PM Charlotte Crumbevon Artis Beggs, PT DPT  [phone]

## 2014-02-02 LAB — GLUCOSE, CAPILLARY
GLUCOSE-CAPILLARY: 119 mg/dL — AB (ref 70–99)
Glucose-Capillary: 120 mg/dL — ABNORMAL HIGH (ref 70–99)

## 2014-02-02 LAB — CBC
HCT: 29.2 % — ABNORMAL LOW (ref 36.0–46.0)
Hemoglobin: 9.3 g/dL — ABNORMAL LOW (ref 12.0–15.0)
MCH: 26.3 pg (ref 26.0–34.0)
MCHC: 31.8 g/dL (ref 30.0–36.0)
MCV: 82.5 fL (ref 78.0–100.0)
PLATELETS: 344 10*3/uL (ref 150–400)
RBC: 3.54 MIL/uL — ABNORMAL LOW (ref 3.87–5.11)
RDW: 15 % (ref 11.5–15.5)
WBC: 12.1 10*3/uL — ABNORMAL HIGH (ref 4.0–10.5)

## 2014-02-02 LAB — BASIC METABOLIC PANEL
ANION GAP: 11 (ref 5–15)
BUN: 21 mg/dL (ref 6–23)
CO2: 25 meq/L (ref 19–32)
CREATININE: 1.02 mg/dL (ref 0.50–1.10)
Calcium: 8.2 mg/dL — ABNORMAL LOW (ref 8.4–10.5)
Chloride: 103 mEq/L (ref 96–112)
GFR calc non Af Amer: 52 mL/min — ABNORMAL LOW (ref >90)
GFR, EST AFRICAN AMERICAN: 60 mL/min — AB (ref >90)
Glucose, Bld: 111 mg/dL — ABNORMAL HIGH (ref 70–99)
Potassium: 3.9 mEq/L (ref 3.7–5.3)
Sodium: 139 mEq/L (ref 137–147)

## 2014-02-02 LAB — LIPASE, BLOOD: Lipase: 87 U/L — ABNORMAL HIGH (ref 11–59)

## 2014-02-02 LAB — HEPARIN LEVEL (UNFRACTIONATED): Heparin Unfractionated: 0.59 IU/mL (ref 0.30–0.70)

## 2014-02-02 MED ORDER — INSULIN ASPART 100 UNIT/ML ~~LOC~~ SOLN: 0.0000 [IU] | Freq: Two times a day (BID) | SUBCUTANEOUS | Status: DC

## 2014-02-02 MED ORDER — TRACE MINERALS CR-CU-F-FE-I-MN-MO-SE-ZN IV SOLN
INTRAVENOUS | Status: AC
  Administered 2014-02-02: 18:00:00 via INTRAVENOUS
  Filled 2014-02-02: qty 2000

## 2014-02-02 MED ORDER — FAT EMULSION 20 % IV EMUL
250.0000 mL | INTRAVENOUS | Status: AC
  Administered 2014-02-02: 250 mL via INTRAVENOUS
  Filled 2014-02-02: qty 250

## 2014-02-02 NOTE — Progress Notes (Signed)
ANTICOAGULATION CONSULT NOTE - Follow up Consult  Pharmacy Consult for heparin Indication: pulmonary embolus  No Known Allergies Patient Measurements: Height: 5\' 4"  (162.6 cm) Weight: 153 lb (69.4 kg) IBW/kg (Calculated) : 54.7 Vital Signs: Temp: 99.5 F (37.5 C) (07/11 1147) Temp src: Oral (07/11 1147) BP: 97/65 mmHg (07/11 1147) Pulse Rate: 96 (07/11 1147) Labs:  Recent Labs  01/31/14 0335 01/31/14 0438 02/01/14 0500 02/02/14 0250  HGB 8.9*  --  8.5* 9.3*  HCT 27.9*  --  26.2* 29.2*  PLT 279  --  286 344  HEPARINUNFRC  --  0.47 0.60 0.59  CREATININE 1.15*  --   --  1.02   Estimated Creatinine Clearance: 44.9 ml/min (by C-G formula based on Cr of 1.02).  Medical History: Past Medical History  Diagnosis Date  . High cholesterol   . Sleep apnea     HX OF APNEA LOST WEIGHT AND NO LONGER   . H/O hiatal hernia   . GERD (gastroesophageal reflux disease)   . Pernicious anemia    Medications:  Facility-administered medications prior to admission  Medication Dose Route Frequency Provider Last Rate Last Dose  . cyanocobalamin ((VITAMIN B-12)) injection 1,000 mcg  1,000 mcg Intramuscular Once Kermit Baloiffany L Reed, DO       Prescriptions prior to admission  Medication Sig Dispense Refill  . lansoprazole (PREVACID) 30 MG capsule Take 30 mg by mouth daily as needed (acid relfux).      . magnesium oxide (MAG-OX) 400 (241.3 MG) MG tablet Take 1 tablet (400 mg total) by mouth 2 (two) times daily.  60 tablet  0  . Probiotic Product (PROBIOTIC DAILY PO) Take 1 capsule by mouth daily.      . ranitidine (ZANTAC) 150 MG tablet Take 150 mg by mouth as needed for heartburn.       Scheduled:  . antiseptic oral rinse  15 mL Mouth Rinse BID  . insulin aspart  0-9 Units Subcutaneous Q12H  . pantoprazole (PROTONIX) IV  40 mg Intravenous Q24H  . sodium chloride  10-40 mL Intracatheter Q12H   Infusions:  . dextrose 5 % and 0.45 % NaCl with KCl 10 mEq/L 30 mL/hr at 02/02/14 0500  . Marland Kitchen.TPN  (CLINIMIX-E) Adult 70 mL/hr at 02/02/14 0500   And  . fat emulsion 500 kcal (02/02/14 0500)  . Marland Kitchen.TPN (CLINIMIX-E) Adult     And  . fat emulsion    . heparin 1,300 Units/hr (02/02/14 0500)   Assessment: Alicia Smith admitted 6/27 for ischemic bowel, now s/p ex lap for pneumoperitoneum found to have acute PE on CT with evidence of right heart strain started on heparin infusion.   Heparin drip rate 1300 uts/hr heparin level remains therapeutic at 0.59, CBC stable,  no s/s of bleeding noted.    Goal of Therapy:  Heparin level 0.3-0.7 units/ml (Aim for higher end of goal) Monitor platelets by anticoagulation protocol: Yes   Plan:  1) Continue heparin drip at 1300 units/hr 2) Monitor daily HL, CBC, and s/s of bleeding    Leota SauersLisa Johonna Binette Pharm.D. CPP, BCPS Clinical Pharmacist [phone] 02/02/2014 2:13 PM

## 2014-02-02 NOTE — Progress Notes (Signed)
Physical Therapy Treatment Patient Details Name: Ronnae Kaser MRN: [id number] DOB: Aug 09, 1935 Today's Date: 2014/02/19    History of Present Illness 78yo AAF admitted 6/27 with pneumoperitoneaum. Pt underwent an ex lap 01/20/14.    PT Comments    Patient able to tolerate increased activity today and requires less physical assist for activities today. Encouraged patient to continue activity and ambulation with staff. Will continue with current POC.   Follow Up Recommendations  Supervision/Assistance - 24 hour;No PT follow up     Equipment Recommendations       Recommendations for Other Services       Precautions / Restrictions Precautions Precautions: Fall Precaution Comments: NG tube Restrictions Weight Bearing Restrictions: No    Mobility  Bed Mobility Overal bed mobility: Needs Assistance Bed Mobility: Rolling;Supine to Sit Rolling: Supervision   Supine to sit: Supervision     General bed mobility comments: able to perform without physical assist today  Transfers Overall transfer level: Needs assistance Equipment used: Rolling walker (2 wheeled) Transfers: Sit to/from Stand Sit to Stand: Supervision         General transfer comment: VCs for safe hand placement, no physical assist required  Ambulation/Gait Ambulation/Gait assistance: Supervision Ambulation Distance (Feet): 210 Feet Assistive device: Rolling walker (2 wheeled) Gait Pattern/deviations: Step-to pattern;Decreased stride length;Trunk flexed;Narrow base of support Gait velocity: decreased modestly but improving compared to prior session   General Gait Details: VCs for upright posture and increased cadence, one standing rest break   Stairs            Wheelchair Mobility    Modified Rankin (Stroke Patients Only)       Balance     Sitting balance-Leahy Scale: Good       Standing balance-Leahy Scale: Fair                      Cognition Arousal/Alertness:  Awake/alert Behavior During Therapy: WFL for tasks assessed/performed Overall Cognitive Status: Within Functional Limits for tasks assessed                      Exercises      General Comments General comments (skin integrity, edema, etc.): discussed continued mobility with patient, encouraged ambulation with staff.       Pertinent Vitals/Pain Patient reports sore throat today, no other pain at this time, VSS    Home Living                      Prior Function            PT Goals (current goals can now be found in the care plan section) Acute Rehab PT Goals Patient Stated Goal: to go home PT Goal Formulation: With patient Time For Goal Achievement: 01/29/14 Potential to Achieve Goals: Good Progress towards PT goals: Progressing toward goals    Frequency  Min 4X/week    PT Plan Current plan remains appropriate    Co-evaluation             End of Session Equipment Utilized During Treatment: Gait belt Activity Tolerance: Patient tolerated treatment well Patient left: with call bell/phone within reach;in bed     Time: 4098-1191 PT Time Calculation (min): 25 min  Charges:  $Gait Training: 8-22 mins $Therapeutic Activity: 8-22 mins                    G CodesFabio Asa 02/19/2014,  10:56 AM Charlotte Crumbevon Ladina Shutters, PT DPT  [phone]

## 2014-02-02 NOTE — Progress Notes (Signed)
PARENTERAL NUTRITION CONSULT NOTE - f/u  Pharmacy Consult for tPN Indication: Ileus  No Known Allergies  Patient Measurements: Height: 5\' 4"  (162.6 cm) Weight: 153 lb (69.4 kg) IBW/kg (Calculated) : 54.7  Vital Signs: Temp: 98 F (36.7 C) (07/11 0725) Temp src: Oral (07/11 0725) BP: 94/62 mmHg (07/11 0725) Pulse Rate: 92 (07/11 0725) Intake/Output from previous day: 07/10 0701 - 07/11 0700 In: 3316 [I.V.:916; NG/GT:640; TPN:1760] Out: 1850 [Urine:700; Emesis/NG output:1150] Intake/Output from this shift: Total I/O In: -  Out: 200 [Emesis/NG output:200]  Labs:  Recent Labs  01/31/14 0335 02/01/14 0500 02/02/14 0250  WBC 10.3 10.8* 12.1*  HGB 8.9* 8.5* 9.3*  HCT 27.9* 26.2* 29.2*  PLT 279 286 344    Recent Labs  01/31/14 0335 02/02/14 0250  NA 138 139  K 3.7 3.9  CL 105 103  CO2 22 25  GLUCOSE 121* 111*  BUN 18 21  CREATININE 1.15* 1.02  CALCIUM 7.9* 8.2*  MG 1.7  --   PHOS 3.6  --   PROT 5.4*  --   ALBUMIN 2.3*  --   AST 13  --   ALT <5  --   ALKPHOS 47  --   BILITOT <0.2*  --    Estimated Creatinine Clearance: 44.9 ml/min (by C-G formula based on Cr of 1.02).   Recent Labs  02/01/14 1341 02/01/14 2126 02/02/14 0502  GLUCAP 119* 123* 120*   Medical History: Past Medical History  Diagnosis Date  . High cholesterol   . Sleep apnea     HX OF APNEA LOST WEIGHT AND NO LONGER   . H/O hiatal hernia   . GERD (gastroesophageal reflux disease)   . Pernicious anemia    Medications:  Facility-administered medications prior to admission  Medication Dose Route Frequency Provider Last Rate Last Dose  . cyanocobalamin ((VITAMIN B-12)) injection 1,000 mcg  1,000 mcg Intramuscular Once Kermit Baloiffany L Reed, DO       Prescriptions prior to admission  Medication Sig Dispense Refill  . lansoprazole (PREVACID) 30 MG capsule Take 30 mg by mouth daily as needed (acid relfux).      . magnesium oxide (MAG-OX) 400 (241.3 MG) MG tablet Take 1 tablet (400 mg total)  by mouth 2 (two) times daily.  60 tablet  0  . Probiotic Product (PROBIOTIC DAILY PO) Take 1 capsule by mouth daily.      . ranitidine (ZANTAC) 150 MG tablet Take 150 mg by mouth as needed for heartburn.       Insulin Requirements in the past 24 hours:  Received 1 unit for CBG of 123 - will change checks to q12 hr  Current Nutrition:  TPN at 3970ml/hr + Lipids at 5310ml/hr  Assessment:C/o epigastric/RUQ pain  78 y/o F admitted 01/19/2014.  She has had 2 prior admission for free air in abdomen with a negative w/u. S/p ex lap 01/20/14 for pneumoperitoneum, negative laparotomy. Continues to have decreased appetite as evidenced by >13% weight loss and moderate muscle and fat wasting per RD note.   GI: Post-op ileus. Continues to have N/V and distention. +BM 7/7. IV PPI for GERD. TPN for inadequate po intake since surgery 6/28. NG/Emesis:  output (1150). Triglycerides 72. Prealbumin is low at 7.8  CT yesterday revealed abd. and pelvic ascites.  Endo: No h/o DM. CBG  120's. Decrease CBG checks to every 12hr.  Lytes: Corrected calcium for lower albumin is 9.5 and WNL, other electrolytes fine.  Renal: Scr stable with UOP of (0.904ml/kg/hr)  Pulm: RA Cards: HLD. BP low end, HR 90's Anticoag: Heparin for mult PE + R heart strain, multiple DVTs. Heparin level 0.60 in goal range.  Hepatobil: Lipase trending down (104) > (84) Neuro: No acute issues noted ID: Afebrile - WBC - 10.8>12.1 (up trend) - no ABX currently Heme: Hgb up, Plts stable Best Practices: IV heparin, IV PPI, mouth care TPN Access: PICC placed 7/7  TPN day: # 4  Nutritional Goals:  1650-1850 kCal, 75-90 grams of protein per day  Goal TPN: Clinimix E 5/15 at 29ml/hr + 20% lipids at 74ml/hr= 1673 kcal and 84g protein daily.  Plan:  Decrease CBG checks to every 12 hrs. Continue IVF  Continue Clinimix E 5/15 to 67ml/hr + lipids at 25ml/hr  Nadara Mustard, PharmD., MS Clinical Pharmacist Pager:  [phone] Thank you for allowing  pharmacy to be part of this patients care team. 02/02/2014,8:55 AM

## 2014-02-02 NOTE — Progress Notes (Signed)
13 Days Post-Op  Subjective: She reports passing some small flatus Denies abdominal pain  Objective: Vital signs in last 24 hours: Temp:  [98 F (36.7 C)-99.6 F (37.6 C)] 98 F (36.7 C) (07/11 0725) Pulse Rate:  [88-108] 92 (07/11 0725) Resp:  [10-23] 19 (07/11 0725) BP: (94-112)/(55-97) 94/62 mmHg (07/11 0725) SpO2:  [99 %-100 %] 100 % (07/11 0725) Last BM Date: 01/30/14  Intake/Output from previous day: 07/10 0701 - 07/11 0700 In: 3316 [I.V.:916; NG/GT:640; TPN:1760] Out: 1850 [Urine:700; Emesis/NG output:1150] Intake/Output this shift: Total I/O In: -  Out: 200 [Emesis/NG output:200]  Abdomen soft but full, rare BS  Lab Results:   Recent Labs  02/01/14 0500 02/02/14 0250  WBC 10.8* 12.1*  HGB 8.5* 9.3*  HCT 26.2* 29.2*  PLT 286 344   BMET  Recent Labs  01/31/14 0335 02/02/14 0250  NA 138 139  K 3.7 3.9  CL 105 103  CO2 22 25  GLUCOSE 121* 111*  BUN 18 21  CREATININE 1.15* 1.02  CALCIUM 7.9* 8.2*   PT/INR No results found for this basename: LABPROT, INR,  in the last 72 hours ABG No results found for this basename: PHART, PCO2, PO2, HCO3,  in the last 72 hours  Studies/Results: Ct Abdomen Pelvis W Contrast  02/01/2014   CLINICAL DATA:  Postoperative ileus.  EXAM: CT ABDOMEN AND PELVIS WITH CONTRAST  TECHNIQUE: Multidetector CT imaging of the abdomen and pelvis was performed using the standard protocol following bolus administration of intravenous contrast.  CONTRAST:  OMNIPAQUE IOHEXOL 300 MG/ML  SOLN  COMPARISON:  CT scan 01/10/2014  FINDINGS: The lungs are clear. No pleural effusion. There is an NG tube coursing down the esophagus and into the stomach.  Moderate abdominal ascites is noted along with mesenteric edema and diffuse body wall edema. Large amount of pelvic ascites is noted. No free air is identified. There is contrast throughout the small bowel and colon without findings for obstruction. No leaking oral contrast. The proximal small  bowel is dilated. There is a transition to decompressed/normal caliber mid distal small bowel loops. This is likely due to adhesions.  The solid abdominal organs are intact. No findings for pancreatitis. The gallbladder surgically absent. No common bile duct dilatation. The aorta and branch vessels are patent. The major venous structures are patent. Stable left renal cyst.  The stomach and duodenum are unremarkable. Diffuse colonic diverticulosis. The bladder is unremarkable. No pelvic mass or adenopathy. No inguinal mass or adenopathy.  The bony structures are unremarkable.  IMPRESSION: Moderate amount of abdominal ascites and large amount of pelvic ascites. No leaking oral contrast or free air to suggest perforation.  Persistently dilated proximal small bowel with a transition to decompressed mid distal small bowel. Findings likely due to adhesions.  Diffuse body wall edema.   Electronically Signed   By: Loralie Champagne M.D.   On: 02/01/2014 16:17   Dg Abd Portable 1v  01/31/2014   CLINICAL DATA:  Postoperative ileus  EXAM: PORTABLE ABDOMEN - 1 VIEW  COMPARISON:  01/29/2014  FINDINGS: Gaseous distention of bowel loops in the mid abdomen, decreased since previous exam.  Small amount of stool and retained contrast in colon with gas and stool in rectum.  Stomach decompressed by nasogastric tube.  Ventral surgical clips post laparotomy.  RIGHT upper quadrant surgical clips likely cholecystectomy.  No bowel wall thickening.  IMPRESSION: Decreased gaseous distention of bowel loops since previous exam.   Electronically Signed   By: Ulyses Southward  M.D.   On: 01/31/2014 11:31    Anti-infectives: Anti-infectives   Start     Dose/Rate Route Frequency Ordered Stop   01/20/14 0315  Ampicillin-Sulbactam (UNASYN) 3 g in sodium chloride 0.9 % 100 mL IVPB     3 g 100 mL/hr over 60 Minutes Intravenous  Once 01/20/14 0300 01/20/14 0520      Assessment/Plan: s/p Procedure(s): EXPLORATORY LAPAROTOMY POSSIBLE BOWEL  RESECTION POSSIBLE COLOSTOMY (N/A)  Post op ileus.  I don't believe there is obstruction recurring Continuing NG.  If output continues to decrease, will clamp it tomorrow  LOS: 14 days    Alicia Smith A 02/02/2014

## 2014-02-03 LAB — CBC
HCT: 26.6 % — ABNORMAL LOW (ref 36.0–46.0)
Hemoglobin: 8.6 g/dL — ABNORMAL LOW (ref 12.0–15.0)
MCH: 26.5 pg (ref 26.0–34.0)
MCHC: 32.3 g/dL (ref 30.0–36.0)
MCV: 81.8 fL (ref 78.0–100.0)
PLATELETS: 341 10*3/uL (ref 150–400)
RBC: 3.25 MIL/uL — AB (ref 3.87–5.11)
RDW: 14.8 % (ref 11.5–15.5)
WBC: 10.5 10*3/uL (ref 4.0–10.5)

## 2014-02-03 LAB — HEPARIN LEVEL (UNFRACTIONATED): Heparin Unfractionated: 0.5 IU/mL (ref 0.30–0.70)

## 2014-02-03 MED ORDER — FAT EMULSION 20 % IV EMUL
250.0000 mL | INTRAVENOUS | Status: AC
  Administered 2014-02-03: 250 mL via INTRAVENOUS
  Filled 2014-02-03: qty 250

## 2014-02-03 MED ORDER — BISACODYL 10 MG RE SUPP
10.0000 mg | Freq: Once | RECTAL | Status: AC
  Administered 2014-02-03: 10 mg via RECTAL
  Filled 2014-02-03: qty 1

## 2014-02-03 MED ORDER — TRACE MINERALS CR-CU-F-FE-I-MN-MO-SE-ZN IV SOLN
INTRAVENOUS | Status: AC
  Administered 2014-02-03: 18:00:00 via INTRAVENOUS
  Filled 2014-02-03: qty 2000

## 2014-02-03 NOTE — Progress Notes (Signed)
PARENTERAL NUTRITION CONSULT NOTE - f/u  Pharmacy Consult for tPN Indication: Ileus  No Known Allergies  Patient Measurements: Height: 5\' 4"  (162.6 cm) Weight: 153 lb (69.4 kg) IBW/kg (Calculated) : 54.7  Vital Signs: Temp: 98.3 F (36.8 C) (07/12 0741) Temp src: Oral (07/12 0741) BP: 93/56 mmHg (07/12 0758) Pulse Rate: 99 (07/12 0758) Intake/Output from previous day: 07/11 0701 - 07/12 0700 In: 1710 [I.V.:750; TPN:960] Out: 1951 [Urine:751; Emesis/NG output:1200]  Labs:  Recent Labs  02/01/14 0500 02/02/14 0250 02/03/14 0330  WBC 10.8* 12.1* 10.5  HGB 8.5* 9.3* 8.6*  HCT 26.2* 29.2* 26.6*  PLT 286 344 341    Recent Labs  02/02/14 0250  NA 139  K 3.9  CL 103  CO2 25  GLUCOSE 111*  BUN 21  CREATININE 1.02  CALCIUM 8.2*   Estimated Creatinine Clearance: 44.9 ml/min (by C-G formula based on Cr of 1.02).   Recent Labs  02/01/14 2126 02/02/14 0502 02/02/14 2128  GLUCAP 123* 120* 119*   Medical History: Past Medical History  Diagnosis Date  . High cholesterol   . Sleep apnea     HX OF APNEA LOST WEIGHT AND NO LONGER   . H/O hiatal hernia   . GERD (gastroesophageal reflux disease)   . Pernicious anemia    Medications:  Facility-administered medications prior to admission  Medication Dose Route Frequency Provider Last Rate Last Dose  . cyanocobalamin ((VITAMIN B-12)) injection 1,000 mcg  1,000 mcg Intramuscular Once Kermit Balo, DO       Prescriptions prior to admission  Medication Sig Dispense Refill  . lansoprazole (PREVACID) 30 MG capsule Take 30 mg by mouth daily as needed (acid relfux).      . magnesium oxide (MAG-OX) 400 (241.3 MG) MG tablet Take 1 tablet (400 mg total) by mouth 2 (two) times daily.  60 tablet  0  . Probiotic Product (PROBIOTIC DAILY PO) Take 1 capsule by mouth daily.      . ranitidine (ZANTAC) 150 MG tablet Take 150 mg by mouth as needed for heartburn.       Insulin Requirements in the past 24 hours:  Received no  additional insulin and CBG's < 150 - will cancel SSI and follow serum glucose for now  Current Nutrition:  TPN at 77ml/hr + Lipids at 69ml/hr  Assessment:C/o epigastric/RUQ pain 78 y/o F admitted 01/19/2014.  She has had 2 prior admission for free air in abdomen with a negative w/u. S/p ex lap 01/20/14 for pneumoperitoneum, negative laparotomy. Continues to have decreased appetite as evidenced by >13% weight loss and moderate muscle and fat wasting per RD note.   GI: Post-op ileus. Continues to have N/V and distention. +BM 7/7. IV PPI for GERD. TPN for inadequate po intake since surgery 6/28.  NG/Emesis:  output still high ( 7/11) - she pulled NG out this AM and refuses to have it replaced despite high risk  Endo: No h/o DM.   Lytes: Corrected calcium for lower albumin is 9.5 and WNL, other electrolytes fine.  Fluids:  D5%0.45NS with 27meq/L KCL at 34ml/hr  Renal: Scr stable with UOP of (0.36ml/kg/hr) Pulm: RA Cards: HLD. BP low end, HR 90's Anticoag: Heparin for mult PE + R heart strain, multiple DVTs. Heparin level 0.60 in goal range.  Hepatobil: Lipase trending down (104) > (84) Neuro: No acute issues noted ID: Afebrile - WBC - 10.5 - no ABX currently Heme: No labs today but no s/s of bleeding problems Best Practices: IV heparin,  IV PPI, mouth care TPN Access: PICC placed 7/7  TPN day: # 5  Nutritional Goals:  1650-1850 kCal, 75-90 grams of protein per day  Goal TPN: Clinimix E 5/15 at 8270ml/hr + 20% lipids at 1610ml/hr= 1673 kcal and 84g protein daily.  Plan:  - Stop CBG checks and evaluate serum glucose - Continue IVF at current rate - Continue Clinimix E 5/15 to 3170ml/hr + lipids at 6310ml/hr - F/U AM labs  Nadara MustardNita Toini Failla, PharmD., MS Clinical Pharmacist Pager:  [phone] Thank you for allowing pharmacy to be part of this patients care team. 02/03/2014,8:12 AM

## 2014-02-03 NOTE — Progress Notes (Signed)
Received orders to transfer, pt aware, family notified. Report called to RN on 6N. No s/s of acute distress noted.

## 2014-02-03 NOTE — Progress Notes (Signed)
ANTICOAGULATION CONSULT NOTE - Follow up Consult  Pharmacy Consult for heparin Indication: pulmonary embolus  No Known Allergies Patient Measurements: Height: 5\' 5"  (165.1 cm) Weight: 152 lb 1.9 oz (69 kg) IBW/kg (Calculated) : 57 Vital Signs: Temp: 99.4 F (37.4 C) (07/12 0953) Temp src: Oral (07/12 0953) BP: 93/61 mmHg (07/12 0953) Pulse Rate: 89 (07/12 0953) Labs:  Recent Labs  02/01/14 0500 02/02/14 0250 02/03/14 0330  HGB 8.5* 9.3* 8.6*  HCT 26.2* 29.2* 26.6*  PLT 286 344 341  HEPARINUNFRC 0.60 0.59 0.50  CREATININE  --  1.02  --    Estimated Creatinine Clearance: 45.8 ml/min (by C-G formula based on Cr of 1.02).  Medical History: Past Medical History  Diagnosis Date  . High cholesterol   . Sleep apnea     HX OF APNEA LOST WEIGHT AND NO LONGER   . H/O hiatal hernia   . GERD (gastroesophageal reflux disease)   . Pernicious anemia    Medications:  Facility-administered medications prior to admission  Medication Dose Route Frequency Provider Last Rate Last Dose  . cyanocobalamin ((VITAMIN B-12)) injection 1,000 mcg  1,000 mcg Intramuscular Once Kermit Baloiffany L Reed, DO       Prescriptions prior to admission  Medication Sig Dispense Refill  . lansoprazole (PREVACID) 30 MG capsule Take 30 mg by mouth daily as needed (acid relfux).      . magnesium oxide (MAG-OX) 400 (241.3 MG) MG tablet Take 1 tablet (400 mg total) by mouth 2 (two) times daily.  60 tablet  0  . Probiotic Product (PROBIOTIC DAILY PO) Take 1 capsule by mouth daily.      . ranitidine (ZANTAC) 150 MG tablet Take 150 mg by mouth as needed for heartburn.       Scheduled:  . antiseptic oral rinse  15 mL Mouth Rinse BID  . bisacodyl  10 mg Rectal Once  . pantoprazole (PROTONIX) IV  40 mg Intravenous Q24H  . sodium chloride  10-40 mL Intracatheter Q12H   Infusions:  . dextrose 5 % and 0.45 % NaCl with KCl 10 mEq/L 30 mL/hr at 02/02/14 0500  . Marland Kitchen.TPN (CLINIMIX-E) Adult 70 mL/hr at 02/02/14 1748   And  .  fat emulsion 250 mL (02/02/14 1900)  . Marland Kitchen.TPN (CLINIMIX-E) Adult     And  . fat emulsion    . heparin 1,300 Units/hr (02/02/14 0500)   Assessment: Alicia Smith admitted 6/27 for ischemic bowel, now s/p ex lap for pneumoperitoneum found to have acute PE on CT with evidence of right heart strain started on heparin infusion.   Heparin drip rate 1300 uts/hr heparin level remains therapeutic at 0.5, H/H down slightly without noted s/s of bleeding.    Goal of Therapy:  Heparin level 0.3-0.7 units/ml  Monitor platelets by anticoagulation protocol: Yes   Plan:  1) Continue heparin drip at 1300 units/hr 2) Monitor daily HL, CBC, and s/s of bleeding   Nadara MustardNita Donnia Poplaski, PharmD., MS Clinical Pharmacist Pager:  [phone] Thank you for allowing pharmacy to be part of this patients care team. 02/03/2014 11:22 AM

## 2014-02-03 NOTE — Progress Notes (Signed)
Patient examined and I agree with the assessment and plan Patient removed NGT. Feeling better. To floor. Violeta GelinasBurke Delores Thelen, MD, MPH, FACS Trauma: [phone] General Surgery: [phone]  02/03/2014 1:40 PM

## 2014-02-03 NOTE — Progress Notes (Signed)
Was called to  pt's room , pt had pulled out NG tube said it "hurt and its my body" I explained to patient that she is still putting out significant amount of volume 900 my shift and the act could lead to more emesis and possible aspiration, requested repeatedly for pt to let me put tube back in  However patient refused and stated " Its not going in they can send me home if they want to"

## 2014-02-03 NOTE — Progress Notes (Signed)
Patient ID: Alicia Smith, female   DOB: September 30, 1935, 78 y.o.   MRN: [id number]  Subjective: Sitting up in a chair, pulled out NGT, states she feels great.  No n/v.  Has not passed gas since yesterday.  VSS.  Afebrile.  Walking in hallways.   Objective:  Vital signs:  Filed Vitals:   02/02/14 2245 02/03/14 0400 02/03/14 0741 02/03/14 0758  BP: 110/68 111/84  93/56  Pulse: 103 92  99  Temp: 98.7 F (37.1 C) 98.8 F (37.1 C) 98.3 F (36.8 C)   TempSrc: Oral Oral Oral   Resp: 18 18  19   Height:      Weight:      SpO2: 97% 100%  100%    Last BM Date: 01/30/14  Intake/Output   Yesterday:  07/11 0701 - 07/12 0700 In: 1710 [I.V.:750; TPN:960] Out: 1951 [Urine:751; Emesis/NG output:1200] This shift:    I/O last 3 completed shifts: In: 2940 [I.V.:1180] Out: 3051 [Urine:1251; Emesis/NG output:1800]    Physical Exam:  General: Pt awake/alert/oriented x4 in acute distress  Chest: cta. No chest wall pain w good excursion  CV: Pulses intact. Regular rhythm  MS: Normal AROM mjr joints. No obvious deformity  Abdomen: Soft. Nondistended. +bs. Non tender. Midline incision-steri strips in place, wound edges are approximated, no erythema. No evidence of peritonitis. No incarcerated hernias.  Ext: SCDs BLE. No mjr edema. No cyanosis  Skin: No petechiae / purpura    Problem List:   Active Problems:   Pneumoperitoneum   AKI (acute kidney injury)   Malnutrition of moderate degree    Results:   Labs: Results for orders placed during the hospital encounter of 01/19/14 (from the past 48 hour(s))  GLUCOSE, CAPILLARY     Status: Abnormal   Collection Time    02/01/14  1:41 PM      Result Value Ref Range   Glucose-Capillary 119 (*) 70 - 99 mg/dL   Comment 1 Documented in Chart     Comment 2 Notify RN    GLUCOSE, CAPILLARY     Status: Abnormal   Collection Time    02/01/14  9:26 PM      Result Value Ref Range   Glucose-Capillary 123 (*) 70 - 99 mg/dL  HEPARIN LEVEL  (UNFRACTIONATED)     Status: None   Collection Time    02/02/14  2:50 AM      Result Value Ref Range   Heparin Unfractionated 0.59  0.30 - 0.70 IU/mL   Comment:            IF HEPARIN RESULTS ARE BELOW     EXPECTED VALUES, AND PATIENT     DOSAGE HAS BEEN CONFIRMED,     SUGGEST FOLLOW UP TESTING     OF ANTITHROMBIN III LEVELS.  CBC     Status: Abnormal   Collection Time    02/02/14  2:50 AM      Result Value Ref Range   WBC 12.1 (*) 4.0 - 10.5 K/uL   RBC 3.54 (*) 3.87 - 5.11 MIL/uL   Hemoglobin 9.3 (*) 12.0 - 15.0 g/dL   HCT 29.2 (*) 36.0 - 46.0 %   MCV 82.5  78.0 - 100.0 fL   MCH 26.3  26.0 - 34.0 pg   MCHC 31.8  30.0 - 36.0 g/dL   RDW 15.0  11.5 - 15.5 %   Platelets 344  150 - 400 K/uL  BASIC METABOLIC PANEL     Status: Abnormal   Collection Time  02/02/14  2:50 AM      Result Value Ref Range   Sodium 139  137 - 147 mEq/L   Potassium 3.9  3.7 - 5.3 mEq/L   Chloride 103  96 - 112 mEq/L   CO2 25  19 - 32 mEq/L   Glucose, Bld 111 (*) 70 - 99 mg/dL   BUN 21  6 - 23 mg/dL   Creatinine, Ser 1.02  0.50 - 1.10 mg/dL   Calcium 8.2 (*) 8.4 - 10.5 mg/dL   GFR calc non Af Amer 52 (*) >90 mL/min   GFR calc Af Amer 60 (*) >90 mL/min   Comment: (NOTE)     The eGFR has been calculated using the CKD EPI equation.     This calculation has not been validated in all clinical situations.     eGFR's persistently <90 mL/min signify possible Chronic Kidney     Disease.   Anion gap 11  5 - 15  LIPASE, BLOOD     Status: Abnormal   Collection Time    02/02/14  2:50 AM      Result Value Ref Range   Lipase 87 (*) 11 - 59 U/L  GLUCOSE, CAPILLARY     Status: Abnormal   Collection Time    02/02/14  5:02 AM      Result Value Ref Range   Glucose-Capillary 120 (*) 70 - 99 mg/dL   Comment 1 Documented in Chart     Comment 2 Notify RN    GLUCOSE, CAPILLARY     Status: Abnormal   Collection Time    02/02/14  9:28 PM      Result Value Ref Range   Glucose-Capillary 119 (*) 70 - 99 mg/dL    Comment 1 Notify RN    HEPARIN LEVEL (UNFRACTIONATED)     Status: None   Collection Time    02/03/14  3:30 AM      Result Value Ref Range   Heparin Unfractionated 0.50  0.30 - 0.70 IU/mL   Comment:            IF HEPARIN RESULTS ARE BELOW     EXPECTED VALUES, AND PATIENT     DOSAGE HAS BEEN CONFIRMED,     SUGGEST FOLLOW UP TESTING     OF ANTITHROMBIN III LEVELS.  CBC     Status: Abnormal   Collection Time    02/03/14  3:30 AM      Result Value Ref Range   WBC 10.5  4.0 - 10.5 K/uL   RBC 3.25 (*) 3.87 - 5.11 MIL/uL   Hemoglobin 8.6 (*) 12.0 - 15.0 g/dL   HCT 26.6 (*) 36.0 - 46.0 %   MCV 81.8  78.0 - 100.0 fL   MCH 26.5  26.0 - 34.0 pg   MCHC 32.3  30.0 - 36.0 g/dL   RDW 14.8  11.5 - 15.5 %   Platelets 341  150 - 400 K/uL    Imaging / Studies: Ct Abdomen Pelvis W Contrast  02/01/2014   CLINICAL DATA:  Postoperative ileus.  EXAM: CT ABDOMEN AND PELVIS WITH CONTRAST  TECHNIQUE: Multidetector CT imaging of the abdomen and pelvis was performed using the standard protocol following bolus administration of intravenous contrast.  CONTRAST:  151m OMNIPAQUE IOHEXOL 300 MG/ML  SOLN  COMPARISON:  CT scan 01/10/2014  FINDINGS: The lungs are clear. No pleural effusion. There is an NG tube coursing down the esophagus and into the stomach.  Moderate abdominal ascites is noted  along with mesenteric edema and diffuse body wall edema. Large amount of pelvic ascites is noted. No free air is identified. There is contrast throughout the small bowel and colon without findings for obstruction. No leaking oral contrast. The proximal small bowel is dilated. There is a transition to decompressed/normal caliber mid distal small bowel loops. This is likely due to adhesions.  The solid abdominal organs are intact. No findings for pancreatitis. The gallbladder surgically absent. No common bile duct dilatation. The aorta and branch vessels are patent. The major venous structures are patent. Stable left renal cyst.  The  stomach and duodenum are unremarkable. Diffuse colonic diverticulosis. The bladder is unremarkable. No pelvic mass or adenopathy. No inguinal mass or adenopathy.  The bony structures are unremarkable.  IMPRESSION: Moderate amount of abdominal ascites and large amount of pelvic ascites. No leaking oral contrast or free air to suggest perforation.  Persistently dilated proximal small bowel with a transition to decompressed mid distal small bowel. Findings likely due to adhesions.  Diffuse body wall edema.   Electronically Signed   By: Kalman Jewels M.D.   On: 02/01/2014 16:17    Scheduled Meds: . antiseptic oral rinse  15 mL Mouth Rinse BID  . pantoprazole (PROTONIX) IV  40 mg Intravenous Q24H  . sodium chloride  10-40 mL Intracatheter Q12H   Continuous Infusions: . dextrose 5 % and 0.45 % NaCl with KCl 10 mEq/L 30 mL/hr at 02/02/14 0500  . Marland KitchenTPN (CLINIMIX-E) Adult 70 mL/hr at 02/02/14 1748   And  . fat emulsion 250 mL (02/02/14 1900)  . Marland KitchenTPN (CLINIMIX-E) Adult     And  . fat emulsion    . heparin 1,300 Units/hr (02/02/14 0500)   PRN Meds:.acetaminophen, morphine injection, ondansetron (ZOFRAN) IV, ondansetron, phenol, sodium chloride, sodium chloride   Antibiotics: Anti-infectives   Start     Dose/Rate Route Frequency Ordered Stop   01/20/14 0315  Ampicillin-Sulbactam (UNASYN) 3 g in sodium chloride 0.9 % 100 mL IVPB     3 g 100 mL/hr over 60 Minutes Intravenous  Once 01/20/14 0300 01/20/14 0520      Assessment/Plan  POD #14, s/p exploratory laparotomy for pneumoperitoneum, negative lap---Dr. Redmond Pulling 6/28  Post op ileus  PCM  -pulled out NGT, okay to leave out, may have SIPS ONLY TODAY. -IS  -mobilize, PT/OT  -TPN  -Heme recommend OP Hematology workup and coumadin for anticoagulation for 6-12 months. Patient has multiple DVTs, PEs, despite being on chemical prophylaxis and mobilizing after surgery  Acute renal insufficiency  -resolved Bilateral PEs  Multiple lower extremity  DVTs in left femoral vein and peroneal vein  -Heparin gtt per pharmacy  -outpatient follow up with Dr. Reed(PCP) who is a ware of ew hypercoagulopathy leading to PE's and DVT's. She will be happy to follow her for her anticoagulation and we made he an appt with their NP on 02/14/14. Dr. Mariea Clonts recommended Xarelto because she's worried about her compliance with medication with Coumadin.  Erby Pian, [facility] Surgery Pager [phone] Office [phone]  02/03/2014 8:41 AM

## 2014-02-04 LAB — CBC
HCT: 27.6 % — ABNORMAL LOW (ref 36.0–46.0)
Hemoglobin: 8.8 g/dL — ABNORMAL LOW (ref 12.0–15.0)
MCH: 26.5 pg (ref 26.0–34.0)
MCHC: 31.9 g/dL (ref 30.0–36.0)
MCV: 83.1 fL (ref 78.0–100.0)
Platelets: 318 10*3/uL (ref 150–400)
RBC: 3.32 MIL/uL — ABNORMAL LOW (ref 3.87–5.11)
RDW: 15.2 % (ref 11.5–15.5)
WBC: 8.3 10*3/uL (ref 4.0–10.5)

## 2014-02-04 LAB — COMPREHENSIVE METABOLIC PANEL
ALT: 7 U/L (ref 0–35)
ANION GAP: 11 (ref 5–15)
AST: 18 U/L (ref 0–37)
Albumin: 2.4 g/dL — ABNORMAL LOW (ref 3.5–5.2)
Alkaline Phosphatase: 65 U/L (ref 39–117)
BUN: 27 mg/dL — AB (ref 6–23)
CO2: 24 mEq/L (ref 19–32)
CREATININE: 1.08 mg/dL (ref 0.50–1.10)
Calcium: 8.4 mg/dL (ref 8.4–10.5)
Chloride: 103 mEq/L (ref 96–112)
GFR calc Af Amer: 56 mL/min — ABNORMAL LOW (ref >90)
GFR calc non Af Amer: 49 mL/min — ABNORMAL LOW (ref >90)
Glucose, Bld: 104 mg/dL — ABNORMAL HIGH (ref 70–99)
POTASSIUM: 4.2 meq/L (ref 3.7–5.3)
Sodium: 138 mEq/L (ref 137–147)
TOTAL PROTEIN: 5.9 g/dL — AB (ref 6.0–8.3)
Total Bilirubin: 0.2 mg/dL — ABNORMAL LOW (ref 0.3–1.2)

## 2014-02-04 LAB — MAGNESIUM: MAGNESIUM: 2.1 mg/dL (ref 1.5–2.5)

## 2014-02-04 LAB — PHOSPHORUS: Phosphorus: 4 mg/dL (ref 2.3–4.6)

## 2014-02-04 LAB — HEPARIN LEVEL (UNFRACTIONATED): Heparin Unfractionated: 0.49 IU/mL (ref 0.30–0.70)

## 2014-02-04 LAB — TRIGLYCERIDES: TRIGLYCERIDES: 61 mg/dL (ref <150)

## 2014-02-04 LAB — PREALBUMIN: Prealbumin: 8.2 mg/dL — ABNORMAL LOW (ref 17.0–34.0)

## 2014-02-04 MED ORDER — RIVAROXABAN 15 MG PO TABS
15.0000 mg | ORAL_TABLET | Freq: Two times a day (BID) | ORAL | Status: DC
  Administered 2014-02-04 – 2014-02-08 (×9): 15 mg via ORAL
  Filled 2014-02-04 (×11): qty 1

## 2014-02-04 MED ORDER — RIVAROXABAN 20 MG PO TABS: 20.0000 mg | ORAL_TABLET | Freq: Every day | ORAL | Status: DC

## 2014-02-04 MED ORDER — FAT EMULSION 20 % IV EMUL
250.0000 mL | INTRAVENOUS | Status: DC
  Administered 2014-02-04: 250 mL via INTRAVENOUS
  Filled 2014-02-04: qty 250

## 2014-02-04 MED ORDER — CLINIMIX E/DEXTROSE (5/15) 5 % IV SOLN
INTRAVENOUS | Status: DC
  Administered 2014-02-04: 17:00:00 via INTRAVENOUS
  Filled 2014-02-04: qty 2000

## 2014-02-04 NOTE — Progress Notes (Signed)
I have read and agree with this note.   Cathy Liviana Mills, OTR/L [phone]     

## 2014-02-04 NOTE — Discharge Instructions (Addendum)
[facility]      [facility] °[phone] ° °OPEN ABDOMINAL SURGERY: POST OP INSTRUCTIONS ° °Always review your discharge instruction sheet given to you by the facility where your surgery was performed. ° °IF YOU HAVE DISABILITY OR FAMILY LEAVE FORMS, YOU MUST BRING THEM TO THE OFFICE FOR PROCESSING.  PLEASE DO NOT GIVE THEM TO YOUR DOCTOR. ° °1. A prescription for pain medication may be given to you upon discharge.  Take your pain medication as prescribed, if needed.  If narcotic pain medicine is not needed, then you may take acetaminophen (Tylenol) or ibuprofen (Advil) as needed. °2. Take your usually prescribed medications unless otherwise directed. °3. If you need a refill on your pain medication, please contact your pharmacy. They will contact our office to request authorization.  Prescriptions will not be filled after 5pm or on week-ends. °4. You should follow a light diet the first few days after arrival home, such as soup and crackers, pudding, etc.unless your doctor has advised otherwise. A high-fiber, low fat diet can be resumed as tolerated.   Be sure to include lots of fluids daily. Most patients will experience some swelling and bruising on the chest and neck area.  Ice packs will help.  Swelling and bruising can take several days to resolve °5. Most patients will experience some swelling and bruising in the area of the incision. Ice pack will help. Swelling and bruising can take several days to resolve..  °6. It is common to experience some constipation if taking pain medication after surgery.  Increasing fluid intake and taking a stool softener will usually help or prevent this problem from occurring.  A mild laxative (Milk of Magnesia or Miralax) should be taken according to package directions if there are no bowel movements after 48 hours. °7.  You may have steri-strips (small skin tapes) in place directly over the incision.  These strips should be left on the skin for 7-10 days.  If your  surgeon used skin glue on the incision, you may shower in 24 hours.  The glue will flake off over the next 2-3 weeks.  Any sutures or staples will be removed at the office during your follow-up visit. You may find that a light gauze bandage over your incision may keep your staples from being rubbed or pulled. You may shower and replace the bandage daily. °8. ACTIVITIES:  You may resume regular (light) daily activities beginning the next day--such as daily self-care, walking, climbing stairs--gradually increasing activities as tolerated.  You may have sexual intercourse when it is comfortable.  Refrain from any heavy lifting or straining until approved by your doctor. °a. You may drive when you no longer are taking prescription pain medication, you can comfortably wear a seatbelt, and you can safely maneuver your car and apply brakes °b. Return to Work: ___________________________________ °9. You should see your doctor in the office for a follow-up appointment approximately two weeks after your surgery.  Make sure that you call for this appointment within a day or two after you arrive home to insure a convenient appointment time. °OTHER INSTRUCTIONS:  °_____________________________________________________________ °_____________________________________________________________ ° °WHEN TO CALL YOUR DOCTOR: °1. Fever over 101.0 °2. Inability to urinate °3. Nausea and/or vomiting °4. Extreme swelling or bruising °5. Continued bleeding from incision. °6. Increased pain, redness, or drainage from the incision. °7. Difficulty swallowing or breathing °8. Muscle cramping or spasms. °9. Numbness or tingling in hands or feet or around lips. ° °The clinic staff is available to   answer your questions during regular business hours.  Please dont hesitate to call and ask to speak to one of the nurses if you have concerns.  For further questions, please visit [email or web address]    Information on my medicine -  XARELTO (rivaroxaban)  This medication education was reviewed with me or my healthcare representative as part of my discharge preparation.  The pharmacist that spoke with me during my hospital stay was:  Lavonia Danamend, Caron George, [facility]  WHY WAS Carlena HurlXARELTO PRESCRIBED FOR YOU? Xarelto was prescribed to treat blood clots that may have been found in the veins of your legs (deep vein thrombosis) or in your lungs (pulmonary embolism) and to reduce the risk of them occurring again.  What do you need to know about Xarelto? The starting dose is one 15 mg tablet taken TWICE daily with food for the FIRST 21 DAYS then on 02/25/14 the dose is changed to one 20 mg tablet taken ONCE A DAY with your evening meal.  DO NOT stop taking Xarelto without talking to the health care provider who prescribed the medication.  Refill your prescription for 20 mg tablets before you run out.  After discharge, you should have regular check-up appointments with your healthcare provider that is prescribing your Xarelto.  In the future your dose may need to be changed if your kidney function changes by a significant amount.  What do you do if you miss a dose? If you are taking Xarelto TWICE DAILY and you miss a dose, take it as soon as you remember. You may take two 15 mg tablets (total 30 mg) at the same time then resume your regularly scheduled 15 mg twice daily the next day.  If you are taking Xarelto ONCE DAILY and you miss a dose, take it as soon as you remember on the same day then continue your regularly scheduled once daily regimen the next day. Do not take two doses of Xarelto at the same time.   Important Safety Information Xarelto is a blood thinner medicine that can cause bleeding. You should call your healthcare provider right away if you experience any of the following:   Bleeding from an injury or your nose that does not stop.   Unusual colored urine (red or dark brown) or unusual colored stools (red or black).    Unusual bruising for unknown reasons.   A serious fall or if you hit your head (even if there is no bleeding).  Some medicines may interact with Xarelto and might increase your risk of bleeding while on Xarelto. To help avoid this, consult your healthcare provider or pharmacist prior to using any new prescription or non-prescription medications, including herbals, vitamins, non-steroidal anti-inflammatory drugs (NSAIDs) and supplements.  This website has more information on Xarelto: [email or web address].

## 2014-02-04 NOTE — Progress Notes (Signed)
PT Cancellation Note  Patient Details Name: Alicia ReapMary Derr-Penn MRN: [id number] DOB: Jul 13, 1936   Cancelled Treatment:    Reason Eval/Treat Not Completed: Patient declined, Per pt just back from a walk in the halls. 02/04/2014  Russell BingKen Jeriah Corkum, PT [phone]  (pager)02/04/2014  Mathews BingKen Abbagail Scaff, PT [phone]  (pager)   Ernest Popowski, Eliseo GumKenneth V 02/04/2014, 4:12 PM

## 2014-02-04 NOTE — Progress Notes (Signed)
PARENTERAL NUTRITION CONSULT NOTE   Pharmacy Consult for TPN Indication: Ileus  No Known Allergies  Patient Measurements: Height: 5\' 5"  (165.1 cm) Weight: 152 lb 1.9 oz (69 kg) IBW/kg (Calculated) : 57  Vital Signs: Temp: 98.5 F (36.9 C) (07/13 0533) Temp src: Oral (07/13 0533) BP: 99/60 mmHg (07/13 0533) Pulse Rate: 88 (07/13 0533) Intake/Output from previous day: 07/12 0701 - 07/13 0700 In: 1143 [I.V.:263; TPN:880] Out: -   Labs:  Recent Labs  02/02/14 0250 02/03/14 0330 02/04/14 0451  WBC 12.1* 10.5 8.3  HGB 9.3* 8.6* 8.8*  HCT 29.2* 26.6* 27.6*  PLT 344 341 318    Recent Labs  02/02/14 0250 02/04/14 0451  NA 139 138  K 3.9 4.2  CL 103 103  CO2 25 24  GLUCOSE 111* 104*  BUN 21 27*  CREATININE 1.02 1.08  CALCIUM 8.2* 8.4  MG  --  2.1  PHOS  --  4.0  PROT  --  5.9*  ALBUMIN  --  2.4*  AST  --  18  ALT  --  7  ALKPHOS  --  65  BILITOT  --  0.2*  TRIG  --  61   Estimated Creatinine Clearance: 43.2 ml/min (by C-G formula based on Cr of 1.08).   Current Nutrition:  Clinimix E 5/15 at 8170ml/hr + 20% lipids at 7610ml/hr provides 84 gm protein, 1673 kcal  Nutritional Goals:  1650-1850 kCal, 75-90 grams of protein per day  Assessment: 78 y/o F admitted 01/19/2014.  She has had 2 prior admission for free air in abdomen with a negative w/u. S/p ex lap 01/20/14 for pneumoperitoneum, negative laparotomy. Continues to have decreased appetite as evidenced by >13% weight loss and moderate muscle and fat wasting per RD note.   GI: Post-op ileus resolving. No nausea this a.m. Pt with multiple BMs s/p suppository. IV PPI for GERD. Pt on clear liquid diet starting this a.m. Plan to begin weaning TPN when pt tolerating more po intake.  Endo: No h/o DM. CBGs/SSI d/c.  Lytes: Lytes wnl.  Renal: Scr stable. UOP not documented accurately. MIVF: D51/2NS with 5210meq/L KCL at 5330ml/hr  Pulm: RA  Cards: HLD. BP low end, HR ok  Anticoag: Heparin changed to Xarelto 7/12  for multiple PE, DVTs. Hgb low but stable, plt ok.  Hepatobil: LFTs wnl. TG 61.  Neuro: No acute issues noted  ID: No issues  Best Practices: Xarelto, IV PPI, mouth care  TPN Access: PICC placed 7/7  TPN day: # 6  Plan:  - Continue Clinimix E 5/15 at 6970ml/hr + 20% lipids at 7110ml/hr - meeting 100% of pt's estimated needs. - Will f/u po intake and ability to advance diet and being TPN weaning - F/U prealbumin  Christoper Fabianaron Monserat Prestigiacomo, PharmD, BCPS Clinical pharmacist, pager [phone] 02/04/2014,9:30 AM

## 2014-02-04 NOTE — Care Management Note (Signed)
02-04-14 Consult for New Xarelto. Patient has Medicaid , Xarelto is on Preferred Medicaid List therefore co pay $3. Confirned with patient [facility] Ronny FlurryHeather Vincentina Sollers RN BSN

## 2014-02-04 NOTE — Progress Notes (Signed)
OT Cancellation Note  Patient Details Name: Alicia Smith MRN: [id number] DOB: [birth year]   Cancelled Treatment:    Reason Eval/Treat Not Completed: Patient declined tx stating that she has been trying to take a nap but keeps getting woken up. Will continue to follow.  Maurene CapesKeene, Paiden Caraveo OTS [phone] 02/04/2014, 11:29 AM

## 2014-02-04 NOTE — Progress Notes (Signed)
I have seen and examined the patient and agree with the assessment and plans. Ileus finally resolving  Alicia Smith A. Magnus IvanBlackman  MD, FACS

## 2014-02-04 NOTE — Progress Notes (Signed)
Patient ID: Alicia Smith, female   DOB: March 26, 1936, 78 y.o.   MRN: [id number] 15 Days Post-Op  Subjective: Pt feels well this am.  No nausea.  Several BMs after suppository.    Objective: Vital signs in last 24 hours: Temp:  [98.3 F (36.8 C)-99.4 F (37.4 C)] 98.5 F (36.9 C) (07/13 0533) Pulse Rate:  [68-99] 88 (07/13 0533) Resp:  [17-19] 17 (07/13 0533) BP: (90-99)/(53-61) 99/60 mmHg (07/13 0533) SpO2:  [99 %-100 %] 100 % (07/13 0533) Weight:  [152 lb 1.9 oz (69 kg)] 152 lb 1.9 oz (69 kg) (07/12 0953) Last BM Date: 02/03/14  Intake/Output from previous day: 07/12 0701 - 07/13 0700 In: 1143 [I.V.:263; TPN:880] Out: -  Intake/Output this shift:    PE: Abd: soft, minimally distended, +BS, incision healing well. Heart: regular, occasional PVCs Lungs: CTAB  Lab Results:   Recent Labs  02/03/14 0330 02/04/14 0451  WBC 10.5 8.3  HGB 8.6* 8.8*  HCT 26.6* 27.6*  PLT 341 318   BMET  Recent Labs  02/02/14 0250 02/04/14 0451  NA 139 138  K 3.9 4.2  CL 103 103  CO2 25 24  GLUCOSE 111* 104*  BUN 21 27*  CREATININE 1.02 1.08  CALCIUM 8.2* 8.4   PT/INR No results found for this basename: LABPROT, INR,  in the last 72 hours CMP     Component Value Date/Time   NA 138 02/04/2014 0451   NA 141 01/03/2014 1405   K 4.2 02/04/2014 0451   CL 103 02/04/2014 0451   CO2 24 02/04/2014 0451   GLUCOSE 104* 02/04/2014 0451   GLUCOSE 78 01/03/2014 1405   BUN 27* 02/04/2014 0451   BUN 18 01/03/2014 1405   CREATININE 1.08 02/04/2014 0451   CALCIUM 8.4 02/04/2014 0451   PROT 5.9* 02/04/2014 0451   PROT 6.2 01/03/2014 1405   ALBUMIN 2.4* 02/04/2014 0451   AST 18 02/04/2014 0451   ALT 7 02/04/2014 0451   ALKPHOS 65 02/04/2014 0451   BILITOT 0.2* 02/04/2014 0451   GFRNONAA 49* 02/04/2014 0451   GFRAA 56* 02/04/2014 0451   Lipase     Component Value Date/Time   LIPASE 87* 02/02/2014 0250       Studies/Results: No results found.  Anti-infectives: Anti-infectives   Start      Dose/Rate Route Frequency Ordered Stop   01/20/14 0315  Ampicillin-Sulbactam (UNASYN) 3 g in sodium chloride 0.9 % 100 mL IVPB     3 g 100 mL/hr over 60 Minutes Intravenous  Once 01/20/14 0300 01/20/14 0520       Assessment/Plan POD #15, s/p exploratory laparotomy for pneumoperitoneum, negative lap---Dr. Andrey Campanile 6/28  Post op ileus, improving  PCM  -clear liquids  -IS  -mobilize, PT/OT  -TPN, will wean when tolerating more  -Heme recommend OP Hematology workup and coumadin for anticoagulation for 6-12 months. 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 Reed(PCP) who is a ware of ew hypercoagulopathy leading to PE's and DVT's. She will be happy to follow her for her anticoagulation and we made he an appt with their NP on 02/14/14. Dr. Renato Gails recommended Xarelto because she's worried about her compliance with medication with Coumadin.  -patient started on xarelto today, 15mg  BID for 21 days,  then increase to 20mg  daily.     LOS: 16 days    [name] 02/04/2014, 7:28 AM Pager: [phone]

## 2014-02-05 MED ORDER — FAT EMULSION 20 % IV EMUL
250.0000 mL | INTRAVENOUS | Status: AC
  Filled 2014-02-05: qty 250

## 2014-02-05 MED ORDER — PANTOPRAZOLE SODIUM 40 MG PO TBEC
40.0000 mg | DELAYED_RELEASE_TABLET | Freq: Every day | ORAL | Status: DC
  Administered 2014-02-05: 40 mg via ORAL
  Filled 2014-02-05 (×3): qty 1

## 2014-02-05 MED ORDER — TRACE MINERALS CR-CU-F-FE-I-MN-MO-SE-ZN IV SOLN
INTRAVENOUS | Status: AC
  Filled 2014-02-05: qty 2000

## 2014-02-05 NOTE — Progress Notes (Signed)
PT Cancellation Note  Patient Details Name: Alicia Smith MRN: [id number] DOB: 10/13/1935   Cancelled Treatment:    Reason Eval/Treat Not Completed: Patient declined,  People are bothering her too much. This therapist has tried to accommodate her multiple times and will not continue to try. 02/05/2014  [place] BingKen Lundon Rosier, PT [phone]  (pager)    Yanina Knupp, Eliseo GumKenneth V 02/05/2014, 12:03 PM

## 2014-02-05 NOTE — Progress Notes (Signed)
OT Cancellation Note  Patient Details Name: [name] MRN: [id number] DOB: 11/30/1935   Cancelled Treatment:    Reason Eval/Treat Not Completed: Fatigue/lethargy limiting ability to participate - pt states she wishes to nap.  She is insistent that she is able to perform all ADLs independently in order to discharge home.  Angelene GiovanniConarpe, Jejuan Scala M Dene Nazir Combesonarpe, OTR/L [phone]  02/05/2014, 4:13 PM

## 2014-02-05 NOTE — Progress Notes (Signed)
I have seen and examined the patient and agree with the assessment and plans. Regular diet today  Dempsey Ahonen A. Magnus IvanBlackman  MD, FACS

## 2014-02-05 NOTE — Progress Notes (Signed)
Patient ID: Alicia Smith, female   DOB: 05/28/1936, 78 y.o.   MRN: [id number] 16 Days Post-Op  Subjective: Patient was really good this morning. She had a large bowel movement. She is not taking much of her clear liquids because she is ready for solid food. She is not nauseated.  Objective: Vital signs in last 24 hours: Temp:  [98.4 F (36.9 C)-98.7 F (37.1 C)] 98.4 F (36.9 C) (07/14 0618) Pulse Rate:  [68-89] 68 (07/14 0618) Resp:  [16-17] 16 (07/14 0618) BP: (98-100)/(56-64) 98/62 mmHg (07/14 0618) SpO2:  [97 %-98 %] 97 % (07/14 0618) Last BM Date: 02/04/14  Intake/Output from previous day: 07/13 0701 - 07/14 0700 In: 1770 [P.O.:440; I.V.:370; TPN:960] Out: 200 [Urine:200] Intake/Output this shift:    PE: Abd: Soft, nontender, nondistended, active bowel sounds, incision is well-healed with Steri-Strips in place  Lab Results:   Recent Labs  02/03/14 0330 02/04/14 0451  WBC 10.5 8.3  HGB 8.6* 8.8*  HCT 26.6* 27.6*  PLT 341 318   BMET  Recent Labs  02/04/14 0451  NA 138  K 4.2  CL 103  CO2 24  GLUCOSE 104*  BUN 27*  CREATININE 1.08  CALCIUM 8.4   PT/INR No results found for this basename: LABPROT, INR,  in the last 72 hours CMP     Component Value Date/Time   NA 138 02/04/2014 0451   NA 141 01/03/2014 1405   K 4.2 02/04/2014 0451   CL 103 02/04/2014 0451   CO2 24 02/04/2014 0451   GLUCOSE 104* 02/04/2014 0451   GLUCOSE 78 01/03/2014 1405   BUN 27* 02/04/2014 0451   BUN 18 01/03/2014 1405   CREATININE 1.08 02/04/2014 0451   CALCIUM 8.4 02/04/2014 0451   PROT 5.9* 02/04/2014 0451   PROT 6.2 01/03/2014 1405   ALBUMIN 2.4* 02/04/2014 0451   AST 18 02/04/2014 0451   ALT 7 02/04/2014 0451   ALKPHOS 65 02/04/2014 0451   BILITOT 0.2* 02/04/2014 0451   GFRNONAA 49* 02/04/2014 0451   GFRAA 56* 02/04/2014 0451   Lipase     Component Value Date/Time   LIPASE 87* 02/02/2014 0250       Studies/Results: No results found.  Anti-infectives: Anti-infectives   Start     Dose/Rate Route Frequency Ordered Stop   01/20/14 0315  Ampicillin-Sulbactam (UNASYN) 3 g in sodium chloride 0.9 % 100 mL IVPB     3 g 100 mL/hr over 60 Minutes Intravenous  Once 01/20/14 0300 01/20/14 0520       Assessment/Plan   POD #17, s/p exploratory laparotomy for pneumoperitoneum, negative lap---Dr. Andrey Campanile 6/28  Post op ileus, resolving PCM  -Advance to soft diet as patient does not want full liquids. -IS  -mobilize, PT/OT  -TPN, will wean to off today -Heme recommend OP Hematology workup and coumadin for anticoagulation for 6-12 months. 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 Reed(PCP) who is a ware of ew hypercoagulopathy leading to PE's and DVT's. She will be happy to follow her for her anticoagulation and we made he an appt with their NP on 02/14/14. Dr. Renato Gails recommended Xarelto because she's worried about her compliance with medication with Coumadin.  -patient started on xarelto today, 15mg  BID for 21 days, then  increase to 20mg  daily.   -For plan for possible discharge home tomorrow.  LOS: 17 days    Debany Vantol E 02/05/2014, 8:13 AM Pager: [phone]

## 2014-02-06 ENCOUNTER — Telehealth: Payer: Self-pay | Admitting: *Deleted

## 2014-02-06 MED ORDER — ENSURE COMPLETE PO LIQD
237.0000 mL | Freq: Three times a day (TID) | ORAL | Status: DC
  Administered 2014-02-06: 237 mL via ORAL

## 2014-02-06 MED ORDER — ENSURE COMPLETE PO LIQD
237.0000 mL | Freq: Two times a day (BID) | ORAL | Status: DC
  Administered 2014-02-06: 237 mL via ORAL

## 2014-02-06 MED ORDER — ENSURE PUDDING PO PUDG: 1.0000 | Freq: Two times a day (BID) | ORAL | Status: DC

## 2014-02-06 NOTE — Discharge Summary (Signed)
Patient ID: Alicia Smith MRN: [id number] DOB/AGE: [birth year] 78 y.o.  Admit date: 01/19/2014 Discharge date: 02/08/2014  Procedures: Exploratory laparotomy by Dr. Gaynelle AduEric Wilson on 01/20/2014  Consults: hematology/oncology  Reason for Admission: 78yo AAF well known to our service who has had two prior admissions for free air in abdomen (oct and earlier this month) with a negative workup. Her previous main c/o had been progressive 100lb weight loss and diarrhea over the past year. She had had normal wbc, normal lactates prior admissions. Last admission, SBFT showed no extravasation of contrast and no pain on exam. She comes in now co of upper abd pain after eating nuts. C/o epigastric/ruq pain. Normal urination. No f/c. Reports pain severe but feels better after pain meds. Reports her diarrhea of 1.5 years had been improving with probiotic. +nausea no emesis.  Admission Diagnoses:  Abdominal pain  Pneumoperitoneum with dilated loops of SB  Elevated lactate  Vit B12 def  Anemia  Hospital Course: The patient was admitted and taken urgently to the operating room. She had an extensive exploratory laparotomy but this was found to be negative. There was no source identified for her pneumoperitoneum. There is no evidence of any fluid in the abdomen that was suspicious for a source either. The patient tolerated this procedure well and was moved to a floor postoperatively. She had an NG tube in place. Initially her postoperative course was fairly normal with a postop ileus for the first 5 days. She began passing some flatus and her NG tube was discontinued at this time. Her diet was able to be advanced over the next couple of days. However, she was not eating well. She continued to have a poor appetite for the next couple of days. She then began developing nausea and vomiting. A followup abdominal film was obtained which revealed a significant postoperative ileus. An NG tube was replaced with significant  output. After several more days of NG tube management the patient began passing flatus again on postoperative day 14. Her NG tube was once again removed. Her diet was able to be advanced as tolerated over the next several days. On postoperative day 16 she was noted to be on a soft diet. She had one small episode of emesis but improved after this. She had multiple large bowel movements. Her diet was kept at soft and she was closely monitored for any further issues. On postoperative day 18, the patient was eating better and was felt stable for discharge home. About a week prior to discharge her staples were removed and Steri-Strips were placed. Her wound is well healed at this time.  On postoperative day 7, after mobilizing in the halls, the patient became diaphoretic, hypotensive, and tachycardic. After further workup, the patient was found to have multiple bilateral pulmonary emboli. She then had Doppler studies of her lower extremities revealing a left common femoral vein DVT as well as a left proximal to mid femoral vein DVT and a left peroneal vein DVT. The patient was started on a heparin drip per pharmacy. Once the patient's ileus resolved, she was started on Xarelto. We have discussed this with her primary physician, Dr. Renato Gailseed, who preferred the patient to be on Xarelto instead of Coumadin. Hematology was consulted for a coagulopathy workup as the patient developed these DVTs and pulmonary emboli while on chemical DVT prophylaxis as well as mobilizing. Due to the heparin drip, the coagulopathy workup would be skewed. Therefore, recommendation is made for followup in one month after discharge. She is  on a gluten free diet at this point at her daughters request as a possible source of her chronic loose stools.  She is walking without assistance and is ready for discharge today.  She will follow up with PCP for her medical issues, and follow treatment for her pulmonary embolus.  She will follow up with Dr.  Bertis Ruddy for further hypercoagulable evaluation.  She can follow up with Dr. Andrey Campanile in 2-3 weeks in the office.  She has not been on anything beyond tylenol for pain for several days.     Condition on d/c:  Improved   Discharge Diagnoses:  Active Problems:   Pneumoperitoneum   AKI (acute kidney injury)   Malnutrition of moderate degree  multiple DVTs/Pulmonay embolus on anitcoagulation Multiple bilateral pulmonary emboli Postoperative ileus, resolved  Discharge Medications:   Medication List    STOP taking these medications       lansoprazole 30 MG capsule  Commonly known as:  PREVACID     magnesium oxide 400 (241.3 MG) MG tablet  Commonly known as:  MAG-OX      TAKE these medications       acetaminophen 325 MG tablet  Commonly known as:  TYLENOL  Take 2 tablets (650 mg total) by mouth every 4 (four) hours as needed for mild pain, moderate pain, fever or headache.     PROBIOTIC DAILY PO  Take 1 capsule by mouth daily.     ranitidine 150 MG tablet  Commonly known as:  ZANTAC  Take 150 mg by mouth as needed for heartburn.     Rivaroxaban 15 MG Tabs tablet  Commonly known as:  XARELTO  Take one tablet twice a day up thru February 25, 2014.  Starting August 4, you go to the new dose of 20 mg, 1 time per day.  This is a second prescription.     rivaroxaban 20 MG Tabs tablet  Commonly known as:  XARELTO  Have this refilled by your Primary care doctor.        Discharge Instructions: Follow-up Information   Follow up with Lenoard Aden, NP On 02/14/2014. (post hospital follow up on 02/14/14 at 2:45pm and check in by 2:30pm.)    Specialty:  Nurse Practitioner   Contact information:   [address] [place] [address] [phone]       Follow up with Atilano Ina, MD In 3 weeks. (For post-operation check)    Specialty:  General Surgery   Contact information:   [address] [place] [address] [phone]       Follow up with [facility],  NI, MD. Schedule an appointment as soon as possible for a visit in 1 month. (for work up for Pulmonary Embolus)    Specialty:  Hematology and Oncology   Contact information:   [address] [place] [address] [phone]       Signed: Sherrie George 02/08/2014, 11:37 AM

## 2014-02-06 NOTE — Progress Notes (Signed)
NUTRITION FOLLOW UP  Intervention:   - Increase Ensure Complete to po TID between meals, each supplement provides 350 kcal and 13 grams of protein - Will add Ensure Pudding po BID to trays, each supplement provides 170 kcal and 4 grams of protein - RD will continue to monitor  Nutrition Dx:   Inadequate oral intake related to decreased appetite as evidenced by >13% weight loss and moderate muscle and fat wasting, ongoing.  Goal:   Pt to meet >/= 90% of their estimated nutrition needs   Monitor:   Wt trends, po intake, acceptance of supplements, labs  Assessment:    78 yo AAF who has had two prior admissions for free air in abdomen (oct and earlier this month) with a negative workup. Her previous main c/o had been progressive 100 lb weight loss and diarrhea over the past year. She had had normal wbc, normal lactates prior admissions. Last admission, SBFT showed no extravasation of contrast and no pain on exam. She comes in now co of upper abd pain after eating nuts. C/o epigastric/ruq pain.   S/P exploratory laparotomy on 6/28 for pneumoperitoneum. Diet was advanced to full liquids on 7/5. Patient with ongoing ileus, unable to tolerate full liquids, vomiting. Patient made NPO and NGT placed on 7/7. PICC placed and TPN initiated on 7/7.   TPN d/c'd 7/14. Pt reports that she can only tolerate 4-5 bites at a time before feeling nauseated. She says that she does "ok" with nutritional supplements, and that she can drink about half. Pt agreed to increase the frequency of nutritional supplements.   Pt meets criteria for MODERATE MALNUTRITION in the context of CHRONIC illness as evidenced by moderate loss of subcutaneous fat, moderate muscle loss, and 13% weight loss in less than 2 months.   Height: Ht Readings from Last 1 Encounters:  02/03/14 5\' 5"  (1.651 m)    Weight Status:   Wt Readings from Last 1 Encounters:  02/03/14 152 lb 1.9 oz (69 kg)    Re-estimated needs:  Kcal: 1650-1850   Protein: 75-90 gm  Fluid: 1.7-1.9 L  Skin: closed incision on abdomen  Diet Order: Gluten Restricted   Intake/Output Summary (Last 24 hours) at 02/06/14 1440 Last data filed at 02/06/14 0920  Gross per 24 hour  Intake    140 ml  Output    550 ml  Net   -410 ml    Last BM: 7/15   Labs:   Recent Labs Lab 01/31/14 0335 02/02/14 0250 02/04/14 0451  NA 138 139 138  K 3.7 3.9 4.2  CL 105 103 103  CO2 22 25 24   BUN 18 21 27*  CREATININE 1.15* 1.02 1.08  CALCIUM 7.9* 8.2* 8.4  MG 1.7  --  2.1  PHOS 3.6  --  4.0  GLUCOSE 121* 111* 104*    CBG (last 3)  No results found for this basename: GLUCAP,  in the last 72 hours  Scheduled Meds: . feeding supplement (ENSURE COMPLETE)  237 mL Oral BID BM  . pantoprazole  40 mg Oral Daily  . Rivaroxaban  15 mg Oral BID WC  . [START ON 02/25/2014] Rivaroxaban  20 mg Oral Q supper  . sodium chloride  10-40 mL Intracatheter Q12H    Continuous Infusions: . dextrose 5 % and 0.45 % NaCl with KCl 10 mEq/L 30 mL/hr at 02/05/14 1457    Ebbie LatusHaley Hawkins RD, LDN

## 2014-02-06 NOTE — Telephone Encounter (Signed)
I have NOT spoken with the patient.  I spoke with a PA for general surgery who was going to get her an appt with me as soon as possible after she left the hospital.  I told the PA that she may need to see [name] b/c I do not have availability.  I don't know what the patient was told.

## 2014-02-06 NOTE — Telephone Encounter (Signed)
She has the appt with [name] scheduled for next thursday

## 2014-02-06 NOTE — Progress Notes (Signed)
Patient ID: Alicia Smith, female   DOB: October 23, 1935, 78 y.o.   MRN: [id number] 17 Days Post-Op  Subjective: Patient feels okay today, but had some fullness and a small amount of emesis yesterday afternoon. She has minimal appetite. She no longer has any nausea. She had 2 large bowel movements last night and this morning.  Objective: Vital signs in last 24 hours: Temp:  [98 F (36.7 C)-98.4 F (36.9 C)] 98.4 F (36.9 C) (07/15 0624) Pulse Rate:  [72-91] 72 (07/15 0624) Resp:  [17-18] 18 (07/15 0624) BP: (95-111)/(56-83) 95/56 mmHg (07/15 0624) SpO2:  [93 %-100 %] 93 % (07/15 0624) Last BM Date: 02/06/14  Intake/Output from previous day: 07/14 0701 - 07/15 0700 In: 940 [P.O.:360; I.V.:230; TPN:350] Out: 550 [Urine:250; Emesis/NG output:300] Intake/Output this shift:    PE: Abd: Soft, appropriately tender, incision is well-healed with Steri-Strips in place, active bowel sounds  Lab Results:   Recent Labs  02/04/14 0451  WBC 8.3  HGB 8.8*  HCT 27.6*  PLT 318   BMET  Recent Labs  02/04/14 0451  NA 138  K 4.2  CL 103  CO2 24  GLUCOSE 104*  BUN 27*  CREATININE 1.08  CALCIUM 8.4   PT/INR No results found for this basename: LABPROT, INR,  in the last 72 hours CMP     Component Value Date/Time   NA 138 02/04/2014 0451   NA 141 01/03/2014 1405   K 4.2 02/04/2014 0451   CL 103 02/04/2014 0451   CO2 24 02/04/2014 0451   GLUCOSE 104* 02/04/2014 0451   GLUCOSE 78 01/03/2014 1405   BUN 27* 02/04/2014 0451   BUN 18 01/03/2014 1405   CREATININE 1.08 02/04/2014 0451   CALCIUM 8.4 02/04/2014 0451   PROT 5.9* 02/04/2014 0451   PROT 6.2 01/03/2014 1405   ALBUMIN 2.4* 02/04/2014 0451   AST 18 02/04/2014 0451   ALT 7 02/04/2014 0451   ALKPHOS 65 02/04/2014 0451   BILITOT 0.2* 02/04/2014 0451   GFRNONAA 49* 02/04/2014 0451   GFRAA 56* 02/04/2014 0451   Lipase     Component Value Date/Time   LIPASE 87* 02/02/2014 0250       Studies/Results: No results  found.  Anti-infectives: Anti-infectives   Start     Dose/Rate Route Frequency Ordered Stop   01/20/14 0315  Ampicillin-Sulbactam (UNASYN) 3 g in sodium chloride 0.9 % 100 mL IVPB     3 g 100 mL/hr over 60 Minutes Intravenous  Once 01/20/14 0300 02/05/14 2001       Assessment/Plan POD #18, s/p exploratory laparotomy for pneumoperitoneum, negative lap---Dr. Andrey Campanile 6/28  Post op ileus, resolving  PCM  -Advance to gluten free diet. Patient with a small amount of emesis yesterday. We'll attempt diet again today. Due to minimal appetite, will add Ensure twice a day -IS  -mobilize -Heme recommend OP Hematology workup and coumadin for anticoagulation for 6-12 months. 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 Reed(PCP) who is a ware of ew hypercoagulopathy leading to PE's and DVT's. She will be happy to follow her for her anticoagulation and we made he an appt with their NP on 02/14/14. Dr. Renato Gails recommended Xarelto because she's worried about her compliance with medication with Coumadin.  -patient  started on xarelto today, 15mg  BID for 21 days, then increase to 20mg  daily.  -plan for possible discharge home tomorrow if she tolerates her diet well today..     LOS: 18 days    Alicia Smith E 02/06/2014, 9:14 AM Pager: [phone]

## 2014-02-06 NOTE — Telephone Encounter (Signed)
Left message on patient phone regarding to keep appointment with [name] 7/23 per Dr. Renato Gailseed. Told her to call back if she had any questions.

## 2014-02-06 NOTE — Progress Notes (Signed)
PT Cancellation Note  Patient Details Name: Alicia ReapMary Derr-Penn MRN: [id number] DOB: [birth year]   Cancelled Treatment:    Reason Eval/Treat Not Completed: Patient declined, no reason specified. Pt stated immediately as PT walked in" im not working with therapy. im going home tomorrow and dont need any help walking" will sign off on pt at this time due to continuous cancelled sessions. Please re-order PT if pt is agreeable or there is a decline in functional mobility. Thanks.    Donnamarie [place], Vida Nicol Sc[place], [place]  [phone] 02/06/2014, 9:12 AM

## 2014-02-06 NOTE — Telephone Encounter (Signed)
Patient called and stated that she spoke with you and you told her to call today and schedule an appointment with you tomorrow for a hospital follow up. You have no appointments available and she does not want to see anyone else. Patient very adamant that she is to see you tomorrow because you said so. Please Advise.

## 2014-02-06 NOTE — Progress Notes (Signed)
Pt states she is "getting ready to go to the bathroom". I offer to assist, but she denies needing to go "right now". Instructed pt to call for assistance when ambulating.

## 2014-02-07 MED ORDER — SACCHAROMYCES BOULARDII 250 MG PO CAPS
250.0000 mg | ORAL_CAPSULE | Freq: Two times a day (BID) | ORAL | Status: DC
  Administered 2014-02-07: 250 mg via ORAL
  Filled 2014-02-07 (×4): qty 1

## 2014-02-07 NOTE — Progress Notes (Signed)
18 Days Post-Op  Subjective: She has allot of concerns today.  She is having loose stools, she has only eaten fruit which she says taste greasy to her.  She had loose stools for over 1.5 years before she came to the hospital.  Ever since she had H Pylori, which she reports Dr. Loreta Smith treated.  She is on a gluten free diet which is her daughters idea, and reports she (her daughter) was recently found to be gluten intolerant.  She is asking about what caused all of this and I explained nothing was found to explain free air on the xrays at admission.  She is walking and seems to be stronger.   She also ask about the blood clot, Dr. Bertis Smith has recommended 6-12 months of anticoagulation, with follow up at her office after discharge.  Objective: Vital signs in last 24 hours: Temp:  [97.7 F (36.5 C)-98.6 F (37 C)] 98.6 F (37 C) (07/16 0656) Pulse Rate:  [66-96] 96 (07/16 0656) Resp:  [16-18] 16 (07/16 0656) BP: (92-113)/(59-66) 113/66 mmHg (07/16 0656) SpO2:  [93 %-100 %] 98 % (07/16 0656) Last BM Date: 02/06/14 850 po recorded; Gluten free diet 3 stools Afebrile, VSS Labs OK  Anemia is stable Intake/Output from previous day: 07/15 0701 - 07/16 0700 In: 850 [P.O.:850] Out: -  Intake/Output this shift:    General appearance: alert, cooperative and no distress Resp: clear to auscultation bilaterally GI: soft, non-tender; bowel sounds normal; no masses,  no organomegaly and agressive steri strips in place.  Lab Results:  No results found for this basename: WBC, HGB, HCT, PLT,  in the last 72 hours  BMET No results found for this basename: NA, K, CL, CO2, GLUCOSE, BUN, CREATININE, CALCIUM,  in the last 72 hours PT/INR No results found for this basename: LABPROT, INR,  in the last 72 hours   Recent Labs Lab 02/04/14 0451  AST 18  ALT 7  ALKPHOS 65  BILITOT 0.2*  PROT 5.9*  ALBUMIN 2.4*     Lipase     Component Value Date/Time   LIPASE 87* 02/02/2014 0250      Studies/Results: No results found.  Medications: . feeding supplement (ENSURE COMPLETE)  237 mL Oral TID BM  . feeding supplement (ENSURE)  1 Container Oral BID WC  . pantoprazole  40 mg Oral Daily  . Rivaroxaban  15 mg Oral BID WC  . [START ON 02/25/2014] Rivaroxaban  20 mg Oral Q supper  . sodium chloride  10-40 mL Intracatheter Q12H   . dextrose 5 % and 0.45 % NaCl with KCl 10 mEq/L 30 mL/hr at 02/05/14 1457   Prior to Admission medications   Medication Sig Start Date End Date Taking? Authorizing Provider  lansoprazole (PREVACID) 30 MG capsule Take 30 mg by mouth daily as needed (acid relfux).   Yes Historical Provider, MD  magnesium oxide (MAG-OX) 400 (241.3 MG) MG tablet Take 1 tablet (400 mg total) by mouth 2 (two) times daily. 01/12/14  Yes Alicia Cantor, MD  Probiotic Product (PROBIOTIC DAILY PO) Take 1 capsule by mouth daily.   Yes Historical Provider, MD  ranitidine (ZANTAC) 150 MG tablet Take 150 mg by mouth as needed for heartburn.   Yes Historical Provider, MD     Assessment/Plan Abdominal pain  Pneumoperitoneum with dilated loops of SB, s/p EXPLORATORY LAPAROTOMY,01/20/2014, Atilano Ina, MD  Post op ileus  Post op PE on Lovenox; now on Xarelto 15 mg Bid thru 02/25/14 then 20 mg qd.  100 pound reported weight loss  Elevated lactate  Vit B12 def  Anemia  Mild renal insuffiencey    Plan:  I have ask her to try eating a regular diet, she says she has not eaten real food for 3 weeks.  I will let her shower, I will put her back on her probiotics.  Dr. Magnus Smith will see her later and make a final decision on discharge.   LOS: 19 days    Alicia Smith 02/07/2014

## 2014-02-07 NOTE — Progress Notes (Signed)
Occupational Therapy Treatment and Discharge Patient Details Name: Airabella Barley MRN: [id number] DOB: 11-01-1935 Today's Date: 02/07/2014    History of present illness 78yo AAF admitted 6/27 with pneumoperitoneaum. Pt underwent an ex lap 01/20/14.   OT comments  Pt is modified independent in ADL and ADL transfers. No further OT needs. Pt states she is going home tomorrow and will have her daughter to assist her initially.  Follow Up Recommendations  No OT follow up;Supervision/Assistance - 24 hour (initially)    Equipment Recommendations  None recommended by OT    Recommendations for Other Services      Precautions / Restrictions Precautions Precautions: Fall Precaution Comments: all lines have been d/cd       Mobility Bed Mobility Overal bed mobility: Modified Independent Bed Mobility: Supine to Sit              Transfers Overall transfer level: Modified independent Equipment used: Rolling walker (2 wheeled)                  Balance                                   ADL   Eating/Feeding: Independent;Sitting   Grooming: Wash/dry hands;Modified independent;Standing       Lower Body Bathing: Modified independent;Sit to/from stand       Lower Body Dressing: Sit to/from stand;Modified independent   Toilet Transfer: Modified Independent;Ambulation;Comfort height toilet   Toileting- Clothing Manipulation and Hygiene: Sit to/from stand;Modified independent       Functional mobility during ADLs: Modified independent;Rolling walker General ADL Comments: Pt declining shower until after her TV shows. Pt is very controlling of the situation.  Suggested pt consider a tub seat if she finds she is fatigued with standing showering.      Vision                     Perception     Praxis      Cognition   Behavior During Therapy: [facility] for tasks assessed/performed Overall Cognitive Status: Within Functional Limits for tasks  assessed                       Extremity/Trunk Assessment               Exercises     Shoulder Instructions       General Comments      Pertinent Vitals/ Pain       No pain.  Home Living                                          Prior Functioning/Environment              Frequency       Progress Toward Goals  OT Goals(current goals can now be found in the care plan section)  Progress towards OT goals: Goals met/education completed, patient discharged from OT  Acute Rehab OT Goals Patient Stated Goal: to go home  Plan Discharge plan needs to be updated    Co-evaluation                 End of Session     Activity Tolerance Patient tolerated treatment well   Patient Left in bed;with call bell/phone within reach (EOB)  Nurse Communication          Time: 6148-3073 OT Time Calculation (min): 17 min  Charges: OT General Charges $OT Visit: 1 Procedure OT Treatments $Self Care/Home Management : 8-22 mins  Malka So 02/07/2014, 1:38 PM [phone]

## 2014-02-07 NOTE — Progress Notes (Signed)
I have seen and examined the patient and agree with the assessment and plans.  Lissete Maestas A. Riva Sesma  MD, FACS  

## 2014-02-08 ENCOUNTER — Encounter: Payer: Self-pay | Admitting: General Surgery

## 2014-02-08 ENCOUNTER — Telehealth: Payer: Self-pay | Admitting: Internal Medicine

## 2014-02-08 DIAGNOSIS — I2699 Other pulmonary embolism without acute cor pulmonale: Secondary | ICD-10-CM

## 2014-02-08 HISTORY — DX: Other pulmonary embolism without acute cor pulmonale: I26.99

## 2014-02-08 MED ORDER — RIVAROXABAN 15 MG PO TABS: ORAL_TABLET | ORAL | Status: AC

## 2014-02-08 MED ORDER — WHITE PETROLATUM GEL
Status: AC
  Filled 2014-02-08: qty 5

## 2014-02-08 MED ORDER — ACETAMINOPHEN 325 MG PO TABS: 650.0000 mg | ORAL_TABLET | ORAL | Status: AC | PRN

## 2014-02-08 MED ORDER — RIVAROXABAN 20 MG PO TABS: ORAL_TABLET | ORAL | Status: AC

## 2014-02-08 NOTE — Progress Notes (Signed)
I have seen and examined the patient and agree with the assessment and plans. Ok for discharge today  Maricruz Lucero A. Magnus IvanBlackman  MD, FACS

## 2014-02-08 NOTE — Progress Notes (Signed)
19 Days Post-Op  Subjective: She feels fine and is ready to go home.  Still having some loose stools.  No other complaints.  Objective: Vital signs in last 24 hours: Temp:  [98.3 F (36.8 C)-98.6 F (37 C)] 98.3 F (36.8 C) (07/17 0558) Pulse Rate:  [93-94] 93 (07/17 0558) Resp:  [16-18] 16 (07/17 0558) BP: (102-113)/(54-60) 106/55 mmHg (07/17 0558) SpO2:  [98 %-100 %] 98 % (07/17 0558) Last BM Date: 02/07/14 1155 PO recorded No BM Afebrile, VSS No labs Intake/Output from previous day: 07/16 0701 - 07/17 0700 In: 1185 [P.O.:1155; I.V.:30] Out: -  Intake/Output this shift:    General appearance: alert, cooperative and no distress Resp: clear to auscultation bilaterally GI: soft, non-tender; bowel sounds normal; no masses,  no organomegaly and Incision looks fine I took off some of the steri strips and she is healing nicley.  Lab Results:  No results found for this basename: WBC, HGB, HCT, PLT,  in the last 72 hours  BMET No results found for this basename: NA, K, CL, CO2, GLUCOSE, BUN, CREATININE, CALCIUM,  in the last 72 hours PT/INR No results found for this basename: LABPROT, INR,  in the last 72 hours   Recent Labs Lab 02/04/14 0451  AST 18  ALT 7  ALKPHOS 65  BILITOT 0.2*  PROT 5.9*  ALBUMIN 2.4*     Lipase     Component Value Date/Time   LIPASE 87* 02/02/2014 0250     Studies/Results: No results found.  Medications: . feeding supplement (ENSURE COMPLETE)  237 mL Oral TID BM  . feeding supplement (ENSURE)  1 Container Oral BID WC  . pantoprazole  40 mg Oral Daily  . Rivaroxaban  15 mg Oral BID WC  . [START ON 02/25/2014] Rivaroxaban  20 mg Oral Q supper  . saccharomyces boulardii  250 mg Oral BID  . sodium chloride  10-40 mL Intracatheter Q12H    Assessment/Plan Abdominal pain  Pneumoperitoneum with dilated loops of SB, s/p EXPLORATORY LAPAROTOMY,01/20/2014, Atilano InaEric M Wilson, MD  Post op ileus  Post op PE on Lovenox; now on Xarelto 15 mg Bid thru  02/25/14 then 20 mg qd.  100 pound reported weight loss  Elevated lactate  Vit B12 def  Anemia  Mild renal insuffiencey    Plan:  Home today.   LOS: 20 days    Brielyn Bosak 02/08/2014

## 2014-02-08 NOTE — Telephone Encounter (Signed)
Phone call received from Juluis [facility]William Jennings [facility].  He has discharged Ms. Derr-Penn today on Xarelto, but it requires a prior authorization.  His office will be completing this, but she needs some samples until I see her next Thursday or that goes through her insurance.  We provided her with 4 bottles of 5 pills each to take 15mg  po bid until Thursday when I see her 7/23.  Her daughter will pick them up this afternoon before 5pm.

## 2014-02-08 NOTE — Discharge Planning (Signed)
Copy of AVS to patient who verbalizes understanding. Will dc to private car home when her daughter arrives.

## 2014-02-13 ENCOUNTER — Telehealth: Payer: Self-pay

## 2014-02-13 NOTE — Telephone Encounter (Signed)
Patient called to discuss discontinuing Xarelto. Patient states this medication is making her weak and sick on the stomach. Patient with pending appointment tomorrow. Patient would like to know if she can hold medication for today, Dr.Reed please advise

## 2014-02-14 ENCOUNTER — Encounter: Payer: Self-pay | Admitting: Internal Medicine

## 2014-02-14 ENCOUNTER — Ambulatory Visit: Payer: Self-pay | Admitting: Nurse Practitioner

## 2014-02-14 ENCOUNTER — Ambulatory Visit (INDEPENDENT_AMBULATORY_CARE_PROVIDER_SITE_OTHER): Payer: Medicare Other | Admitting: Internal Medicine

## 2014-02-14 VITALS — BP 118/70 | HR 92 | Temp 98.6°F | Resp 10 | Wt 126.0 lb

## 2014-02-14 DIAGNOSIS — E440 Moderate protein-calorie malnutrition: Secondary | ICD-10-CM

## 2014-02-14 DIAGNOSIS — I2699 Other pulmonary embolism without acute cor pulmonale: Secondary | ICD-10-CM

## 2014-02-14 DIAGNOSIS — R634 Abnormal weight loss: Secondary | ICD-10-CM

## 2014-02-14 DIAGNOSIS — K2990 Gastroduodenitis, unspecified, without bleeding: Secondary | ICD-10-CM

## 2014-02-14 DIAGNOSIS — K2970 Gastritis, unspecified, without bleeding: Secondary | ICD-10-CM

## 2014-02-14 DIAGNOSIS — N183 Chronic kidney disease, stage 3 unspecified: Secondary | ICD-10-CM

## 2014-02-14 DIAGNOSIS — R197 Diarrhea, unspecified: Secondary | ICD-10-CM

## 2014-02-14 NOTE — Progress Notes (Signed)
Patient ID: Alicia Smith, female   DOB: 09/17/1935, 78 y.o.   MRN: [id number]   Location[facility]  [facility]   No Known Allergies  Chief Complaint  Patient presents with  . Hospitalization Follow-up    Patient was seen in ER on June 27,2014  . Medication Management    Discuss Xarelto and possible side effects   . Weight Loss    Last weight was 145, today 126 . Patient unable to eat, decreased appetite     HPI: Patient is a 78 y.o. black female seen in the office today for hospital f/u.    Has not eaten for 3 wks.  Too weak.  Lost her appetite.  All had today was clementines, hot dog, watermelon.  Whenever she eats anything, she has diarrhea.   Had been eating before she went to the hospital  Dr. Loreta AveMann gave her probiotic Dr. Bosie ClosSchooler gave her creon Started to get solid stools, then stopped it Has not had probiotic since hospitalization. Does not feel nauseous.   Had felt nauseous when took xarelto.  Only eating a piece of ham or something with it.   Stomach feels best when empty.  Review of Systems:  Review of Systems  Constitutional: Positive for weight loss and malaise/fatigue. Negative for fever and chills.  HENT: Negative for congestion.   Eyes: Negative for blurred vision.  Respiratory: Negative for shortness of breath.   Cardiovascular: Negative for chest pain.  Gastrointestinal: Positive for heartburn, nausea, abdominal pain and diarrhea. Negative for vomiting, constipation, blood in stool and melena.  Genitourinary: Negative for dysuria.  Musculoskeletal: Negative for falls.  Skin: Negative for rash.  Neurological: Positive for weakness. Negative for dizziness and loss of consciousness.  Psychiatric/Behavioral: Negative for depression and memory loss.     Past Medical History  Diagnosis Date  . High cholesterol   . Sleep apnea     HX OF APNEA LOST WEIGHT AND NO LONGER   . H/O hiatal hernia   . GERD (gastroesophageal reflux  disease)   . Pernicious anemia   . Acute pulmonary embolism 02/08/2014    Past Surgical History  Procedure Laterality Date  . Abdominal hysterectomy  1984    Dr. Gaynell FaceMarshall  . [place]  [address]  . [place]  1959    [place]  . Esophagogastroduodenoscopy Left 05/17/2013    Procedure: ESOPHAGOGASTRODUODENOSCOPY (EGD);  Surgeon: Willis ModenaWilliam Outlaw, MD;  [facility];  Service: Endoscopy;  Laterality: Left;  . Laparotomy N/A 01/20/2014    Procedure: EXPLORATORY LAPAROTOMY POSSIBLE BOWEL RESECTION POSSIBLE COLOSTOMY;  Surgeon: Atilano InaEric M Wilson, MD;  [facility];  Service: General;  Laterality: N/A;    Social History:   reports that she quit smoking about 31 years ago. Her smoking use included Cigarettes. She smoked 0.00 packs per day. She has quit using smokeless tobacco. She reports that she drinks alcohol. She reports that she uses illicit drugs (Marijuana).  Family History  Problem Relation Age of Onset  . Cancer Mother   . Cancer Father   . Cancer Sister     breast  . Diabetes Sister   . Hypertension Sister   . Cancer Brother     prostate  . Kidney disease Sister   . Heart disease Brother     Medications: Patient's Medications  New Prescriptions   No medications on file  Previous Medications   ACETAMINOPHEN (TYLENOL) 325 MG TABLET  Take 2 tablets (650 mg total) by mouth every 4 (four) hours as needed for mild pain, moderate pain, fever or headache.   PROBIOTIC PRODUCT (PROBIOTIC DAILY PO)    Take 1 capsule by mouth daily.   RANITIDINE (ZANTAC) 150 MG TABLET    Take 150 mg by mouth as needed for heartburn.   RIVAROXABAN (XARELTO) 15 MG TABS TABLET    Take one tablet twice a day up thru February 25, 2014.  Starting August 4, you go to the new dose of 20 mg, 1 time per day.  This is a second prescription.   RIVAROXABAN (XARELTO) 20 MG TABS TABLET    Have this refilled by your Primary care doctor.  Modified Medications   No medications on file   Discontinued Medications   No medications on file     Physical Exam: Filed Vitals:   02/14/14 1505  BP: 118/70  Pulse: 92  Temp: 98.6 F (37 C)  TempSrc: Oral  Resp: 10  Weight: 126 lb (57.153 kg)  Physical Exam  Constitutional: She is oriented to person, place, and time.  Frail appearing black female here with her daughter today  Cardiovascular: Normal rate, regular rhythm, normal heart sounds and intact distal pulses.   Pulmonary/Chest: Effort normal and breath sounds normal. No respiratory distress.  Abdominal: Soft. Bowel sounds are normal. She exhibits no distension and no mass. There is no tenderness. There is no rebound and no guarding. No hernia.  Musculoskeletal: Normal range of motion.  Neurological: She is alert and oriented to person, place, and time.  Skin: Skin is warm and dry.  Psychiatric: She has a normal mood and affect.    Labs reviewed: Basic Metabolic Panel:  Recent Labs  16/10/96 1035  10/04/13 0822  01/11/14 0245  01/30/14 0405 01/31/14 0335 02/02/14 0250 02/04/14 0451  NA 141  < > 143  < > 140  < > 137 138 139 138  K 4.0  < > 3.9  < > 2.7*  < > 3.9 3.7 3.9 4.2  CL 106  < > 107  < > 106  < > 103 105 103 103  CO2 19  < > 18  < > 16*  < > 22 22 25 24   GLUCOSE 78  < > 88  < > 78  < > 105* 121* 111* 104*  BUN 19  < > 19  < > 17  < > 16 18 21  27*  CREATININE 1.34*  < > 1.70*  < > 1.16*  < > 1.24* 1.15* 1.02 1.08  CALCIUM 9.1  < > 9.1  < > 8.5  < > 8.1* 7.9* 8.2* 8.4  MG  --   --   --   --  0.8*  < > 1.6 1.7  --  2.1  PHOS  --   --   --   < > 3.2  --  3.3 3.6  --  4.0  TSH 1.880  --  2.260  --  3.370  --   --   --   --   --   < > = values in this interval not displayed. Liver Function Tests:  Recent Labs  01/30/14 0405 01/31/14 0335 02/04/14 0451  AST 18 13 18   ALT 6 <5 7  ALKPHOS 47 47 65  BILITOT 0.2* <0.2* 0.2*  PROT 5.3* 5.4* 5.9*  ALBUMIN 2.4* 2.3* 2.4*    Recent Labs  01/03/14 1405  01/31/14 0335 02/01/14 0500  02/02/14  0250  LIPASE 59  < > 209* 104* 87*  AMYLASE 172*  --   --  123*  --   < > = values in this interval not displayed. No results found for this basename: AMMONIA,  in the last 8760 hours CBC:  Recent Labs  01/10/14 1705 01/11/14 0245  01/20/14 0120  02/02/14 0250 02/03/14 0330 02/04/14 0451  WBC 6.2 5.5  < > 9.7  < > 12.1* 10.5 8.3  NEUTROABS 3.0 2.8  --  5.0  --   --   --   --   HGB 11.3* 10.4*  < > 10.4*  < > 9.3* 8.6* 8.8*  HCT 34.6* 31.9*  < > 32.6*  < > 29.2* 26.6* 27.6*  MCV 83.0 82.6  < > 83.8  < > 82.5 81.8 83.1  PLT 247 195  < > PLATELET CLUMPS NOTED ON SMEAR, COUNT APPEARS ADEQUATE  < > 344 341 318  < > = values in this interval not displayed. Lipid Panel:  Recent Labs  03/23/13 1035 10/04/13 0822 01/30/14 0405 02/04/14 0451  HDL 42 51  --   --   LDLCALC 94 114*  --   --   TRIG 92 98 72 61  CHOLHDL 3.7 3.6  --   --    Lab Results  Component Value Date   HGBA1C 5.2 10/04/2013   Assessment/Plan 1. Loss of weight - ongoing--her daughter wants me to check for celiac disease due to the diarrhea and fact that she has it - Tissue transglutaminase, IgA - Basic metabolic panel  2. Diarrhea -workup thus far has been unrevealing - Tissue transglutaminase, IgA - Basic metabolic panel  3. Gastritis - completed h pylori therapy at least she says she did (unclear b/c her story changes frequently when she answers my questions) - Tissue transglutaminase, IgA - Basic metabolic panel  4. Acute pulmonary embolism -cont xarelto -reviewed extensively with her daughter and herself  5. Malnutrition of moderate degree -persists, unclear why she has no appetite -cont zantac--ppis may have caused her diarrhea  6. CKD (chronic kidney disease), stage III -cont to avoid nsaids, nephrotoxic agents -encourage hydration  Labs/tests ordered: Orders Placed This Encounter  Procedures  . Tissue transglutaminase, IgA  . Basic metabolic panel    Next appt:  3  mos

## 2014-02-14 NOTE — Telephone Encounter (Signed)
Will address at her appt today

## 2014-02-14 NOTE — Patient Instructions (Signed)
Follow up with Dr. Bosie ClosSchooler as soon as possible.    We did a test to look at celiac disease today.

## 2014-02-17 LAB — BASIC METABOLIC PANEL
BUN/Creatinine Ratio: 15 (ref 11–26)
BUN: 21 mg/dL (ref 8–27)
CO2: 16 mmol/L — ABNORMAL LOW (ref 18–29)
Calcium: 10.1 mg/dL (ref 8.7–10.3)
Chloride: 99 mmol/L (ref 97–108)
Creatinine, Ser: 1.41 mg/dL — ABNORMAL HIGH (ref 0.57–1.00)
GFR calc Af Amer: 42 mL/min/{1.73_m2} — ABNORMAL LOW (ref >59)
GFR calc non Af Amer: 36 mL/min/{1.73_m2} — ABNORMAL LOW (ref >59)
Glucose: 89 mg/dL (ref 65–99)
Potassium: 5 mmol/L (ref 3.5–5.2)
Sodium: 135 mmol/L (ref 134–144)

## 2014-02-17 LAB — TISSUE TRANSGLUTAMINASE, IGA: Transglutaminase IgA: <2 U/mL (ref 0–3)

## 2014-02-19 ENCOUNTER — Telehealth: Payer: Self-pay

## 2014-02-19 MED ORDER — MIRTAZAPINE 7.5 MG PO TABS: 7.5000 mg | ORAL_TABLET | Freq: Every day | ORAL | Status: AC

## 2014-02-19 NOTE — Telephone Encounter (Signed)
We discussed that extensively at the appointment.  I thought at that time that she was taking remeron 7.5mg , but now I see it's not on her list?  Let's put her on remeron 7.5mg  at bedtime.  She also needs to f/u with Dr. Bosie ClosSchooler as discussed at the appt asap b/c all of her symptoms are related to her GI system.

## 2014-02-19 NOTE — Telephone Encounter (Signed)
Patient called back, discussed with patient

## 2014-02-19 NOTE — Telephone Encounter (Signed)
Patient called to request something to increase her appetite. Patient with significant weight loss on 02/03/14 patient weighed 152, as of 02/14/2014 patient weighs 126.  Dr.Reed please advise

## 2014-02-19 NOTE — Telephone Encounter (Signed)
Left message on voicemail for patient to return call when available   

## 2014-02-28 ENCOUNTER — Ambulatory Visit: Payer: Self-pay | Admitting: Internal Medicine

## 2014-04-25 DEATH — deceased

## 2014-07-09 NOTE — Progress Notes (Signed)
Patient ID: Alicia ReapMary Derr-Penn, female   DOB: 1936-01-24, 78 y.o.   MRN: [id number] B12 given by MA

## 2016-04-30 IMAGING — CT CT ANGIO CHEST
2 of 11 series · 17 of 46 positions shown · IV contrast (Omni 300)
Comparison: Chest radiographs dated 01/20/2014 and chest CT dated
01/10/2014.

CLINICAL DATA: Hypoxemia.  Clinical concern for pulmonary embolism.

EXAM:
CT ANGIOGRAPHY CHEST WITH CONTRAST
TECHNIQUE: Multidetector CT imaging of the chest was performed using the
standard protocol during bolus administration of intravenous
contrast. Multiplanar CT image reconstructions and MIPs were
obtained to evaluate the vascular anatomy.
CONTRAST:  100mL OMNIPAQUE IOHEXOL 350 MG/ML SOLN

[Series 7: thins · axial · 0.64mm/px · z∈[+1482,+1690]mm · 14 of 240 slices shown]
[im 16/240  lung]
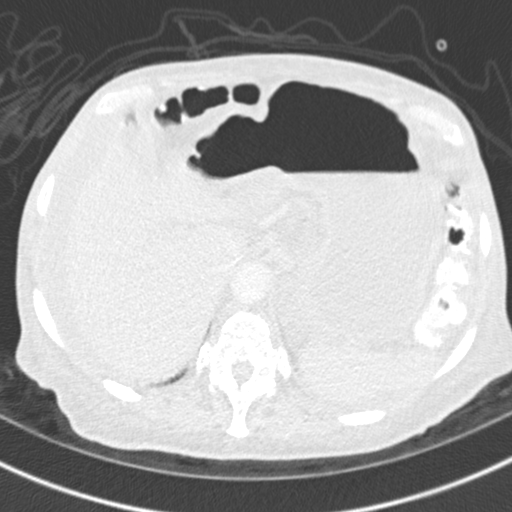
[im 32/240  soft-tissue]
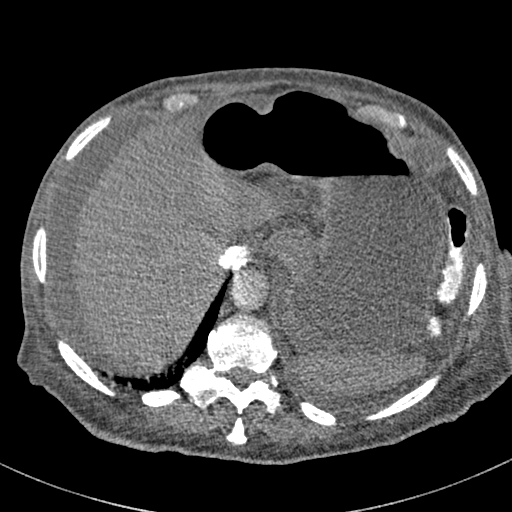
[im 48/240  lung]
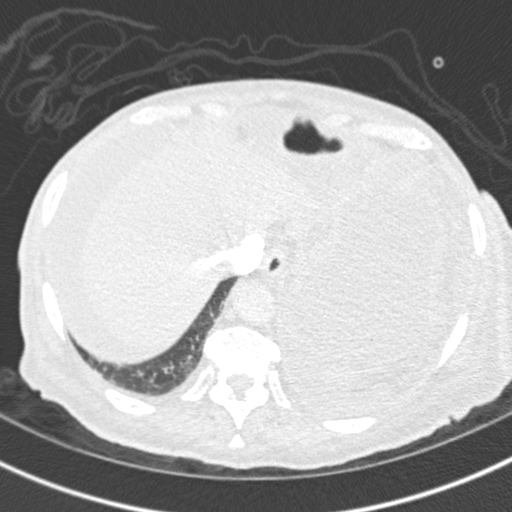
[im 64/240  soft-tissue]
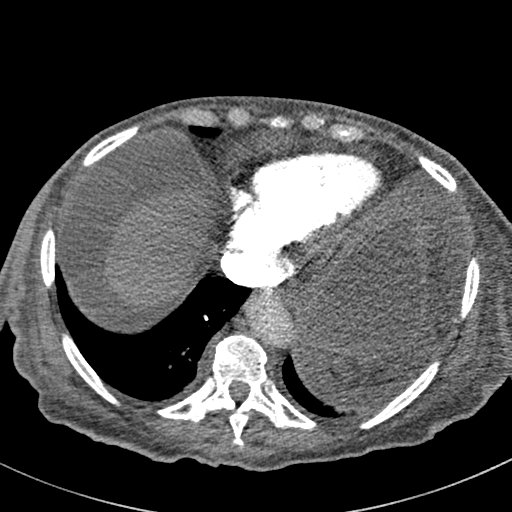
[im 80/240  lung]
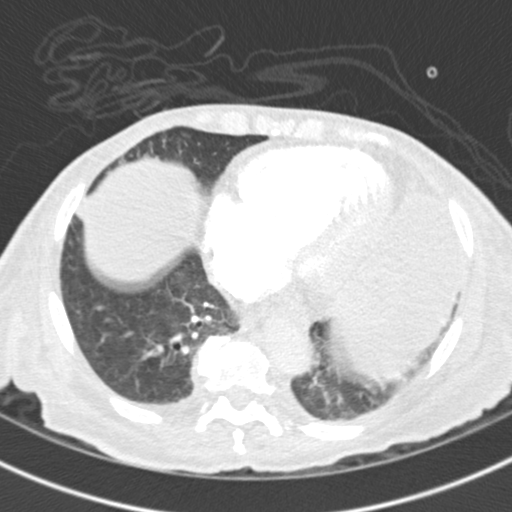
[im 96/240  soft-tissue]
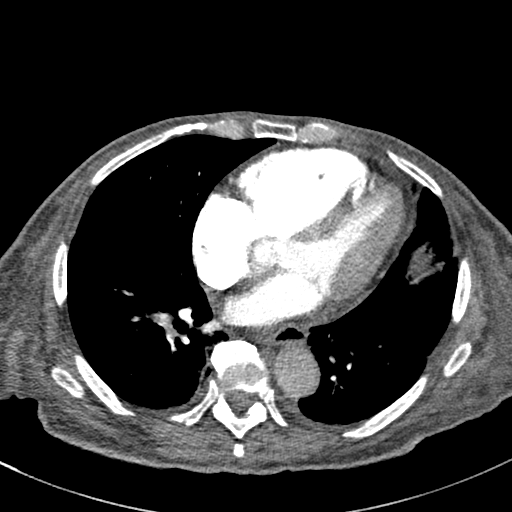
[im 112/240  lung]
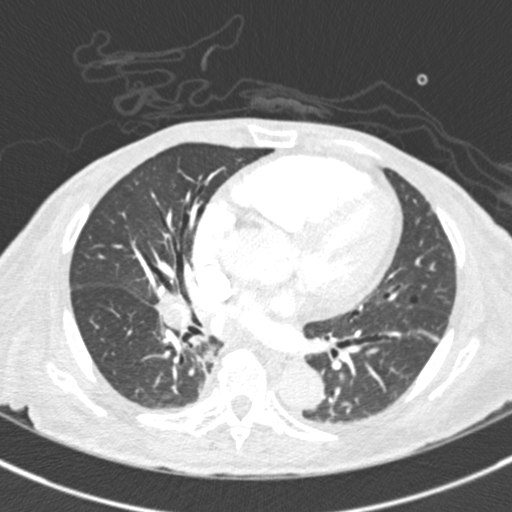
[im 128/240  soft-tissue]
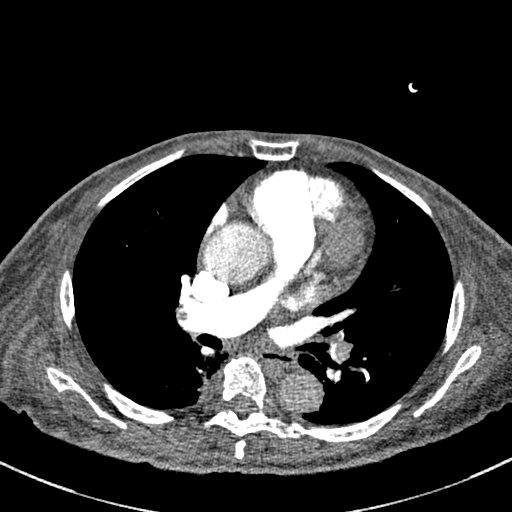
[im 144/240  lung]
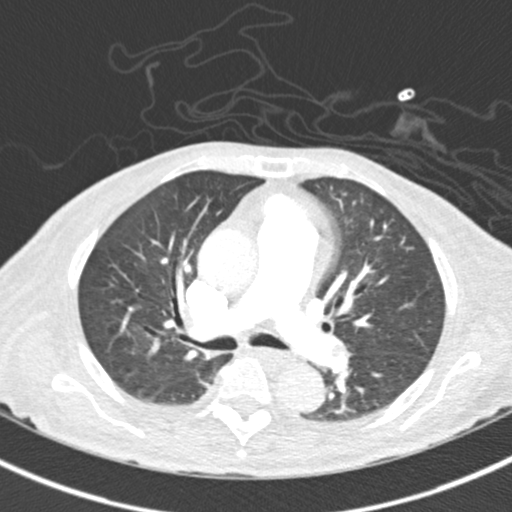
[im 160/240  soft-tissue]
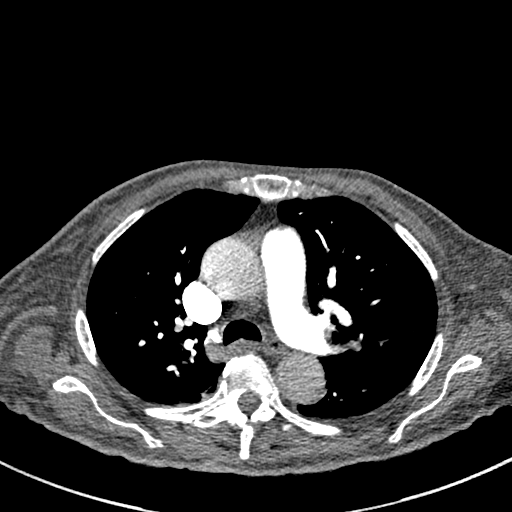
[im 176/240  lung]
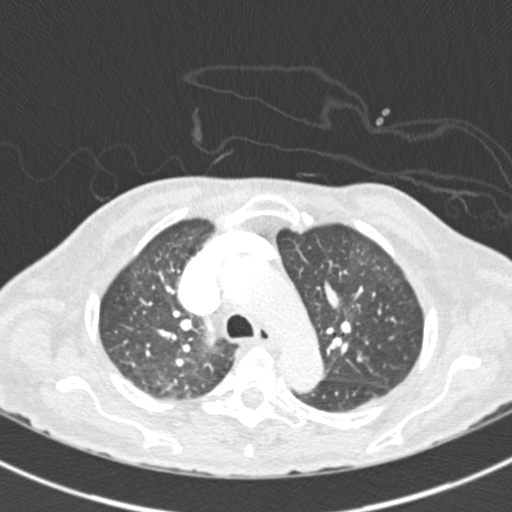
[im 192/240  soft-tissue]
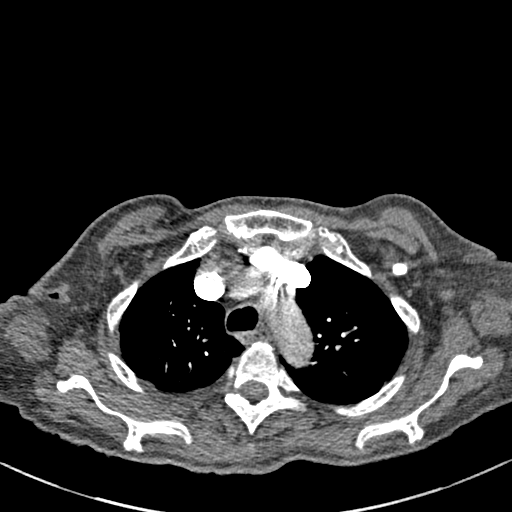
[im 208/240  lung]
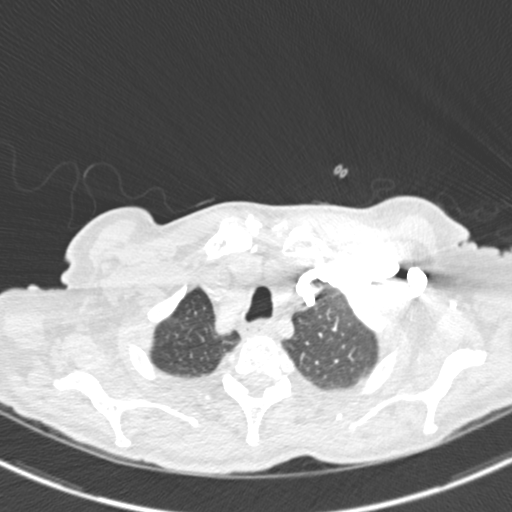
[im 224/240  soft-tissue]
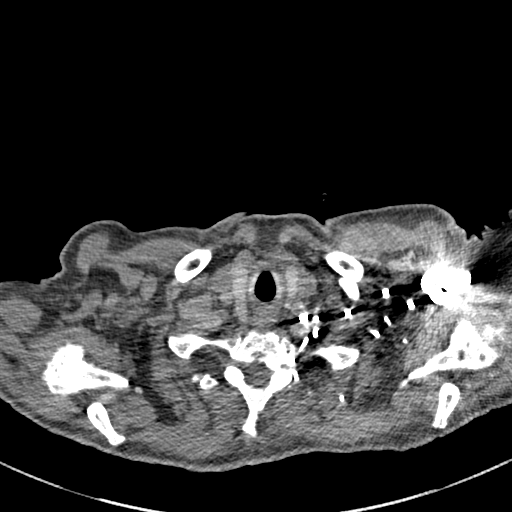

[Series 9: coronal mpr · coronal · 0.49mm/px · 3 of 130 slices shown]
[im 33/130  soft-tissue]
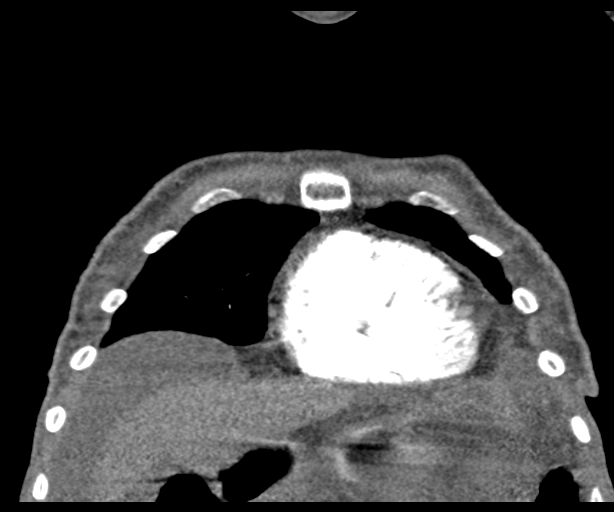
[im 65/130  soft-tissue]
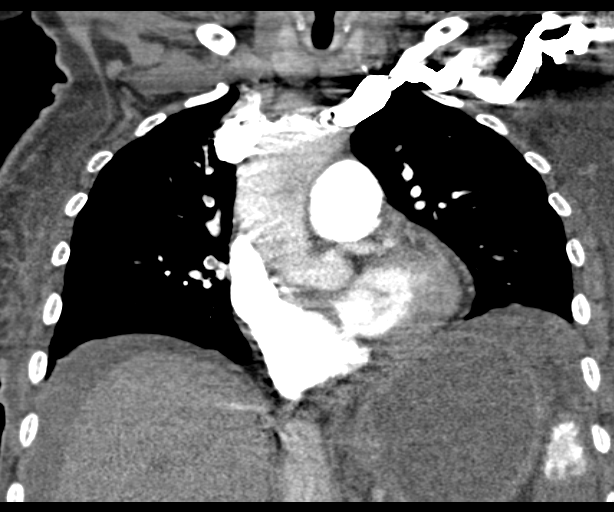
[im 97/130  soft-tissue]
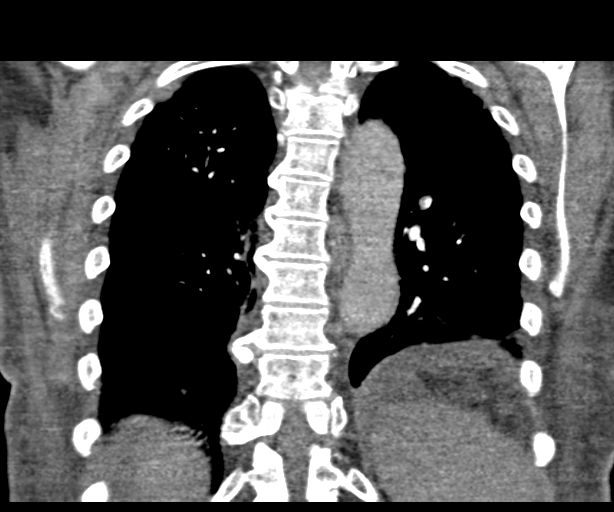

[17 of 46 positions shown; findings below may reference images not displayed]

FINDINGS: Multiple moderately large bilateral occluding and non occluding
pulmonary arterial filling defects beginning just beyond the distal
main pulmonary arteries. The maximum inner diameter of the right
ventricle is 6.3 cm on image number 54 and the maximum inner
diameter of the left ventricle is 2.4 cm on image number 48. The
right ventricular to left ventricular ratio is 2.62.

Small bilateral pleural effusions. Moderate amount of upper
abdominal ascites with progression. Previously demonstrated large
left renal cyst. Cholecystectomy clips.

The previously demonstrated 4 mm nodule in the left lower lobe is
currently not visualized. A 2 mm nodule in the posterior right
middle lobe is unchanged on image number 40. Interval visualization
of a 3 mm nodule in the superior segment of the left lower lobe on
image number 29.

No enlarged lymph nodes.  Thoracic spine degenerative changes.

Review of the MIP images confirms the above findings.
IMPRESSION: 1. Positive for acute PE with CT evidence of right heart strain
(RV/LV Ratio = 2.62) consistent with at least submassive
(intermediate risk)PE. The presence of right heart strain has been
associated with an increased risk of morbidity and mortality.
Consultation with Pulmonary and [REDACTED] is
recommended.
2. Overall little change in minimal nodularity in both lungs.
3. Moderate amount of ascites, increased.
Critical Value/emergent results were called by telephone at the time
of interpretation on 01/27/2014 at [DATE] to Rosa, the patient's
nurse, who verbally acknowledged these results. She stated that she
will inform the patient's physician.

## 2016-05-02 IMAGING — CR DG ABDOMEN 2V
3 series · 3 of 3 positions shown · non-contrast
Comparison: 01/20/2014

CLINICAL DATA: Evaluate ileus.

EXAM:
ABDOMEN - 2 VIEW

[w abdomen decub]
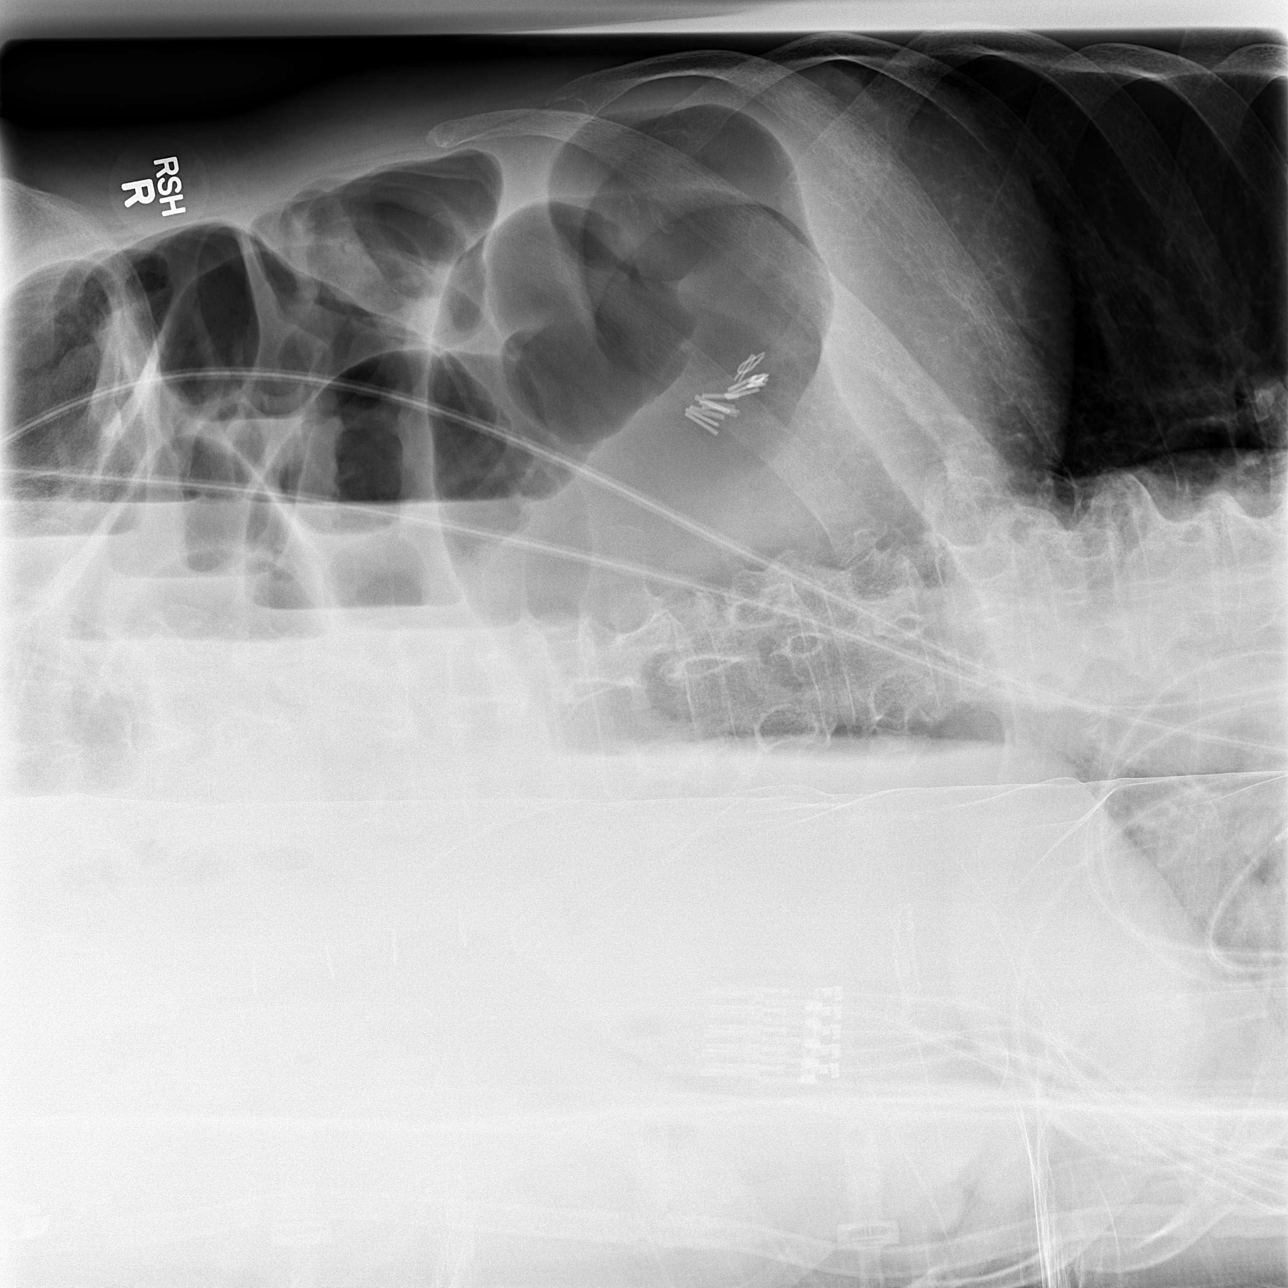

[t abdomen supine (1 of 2)]
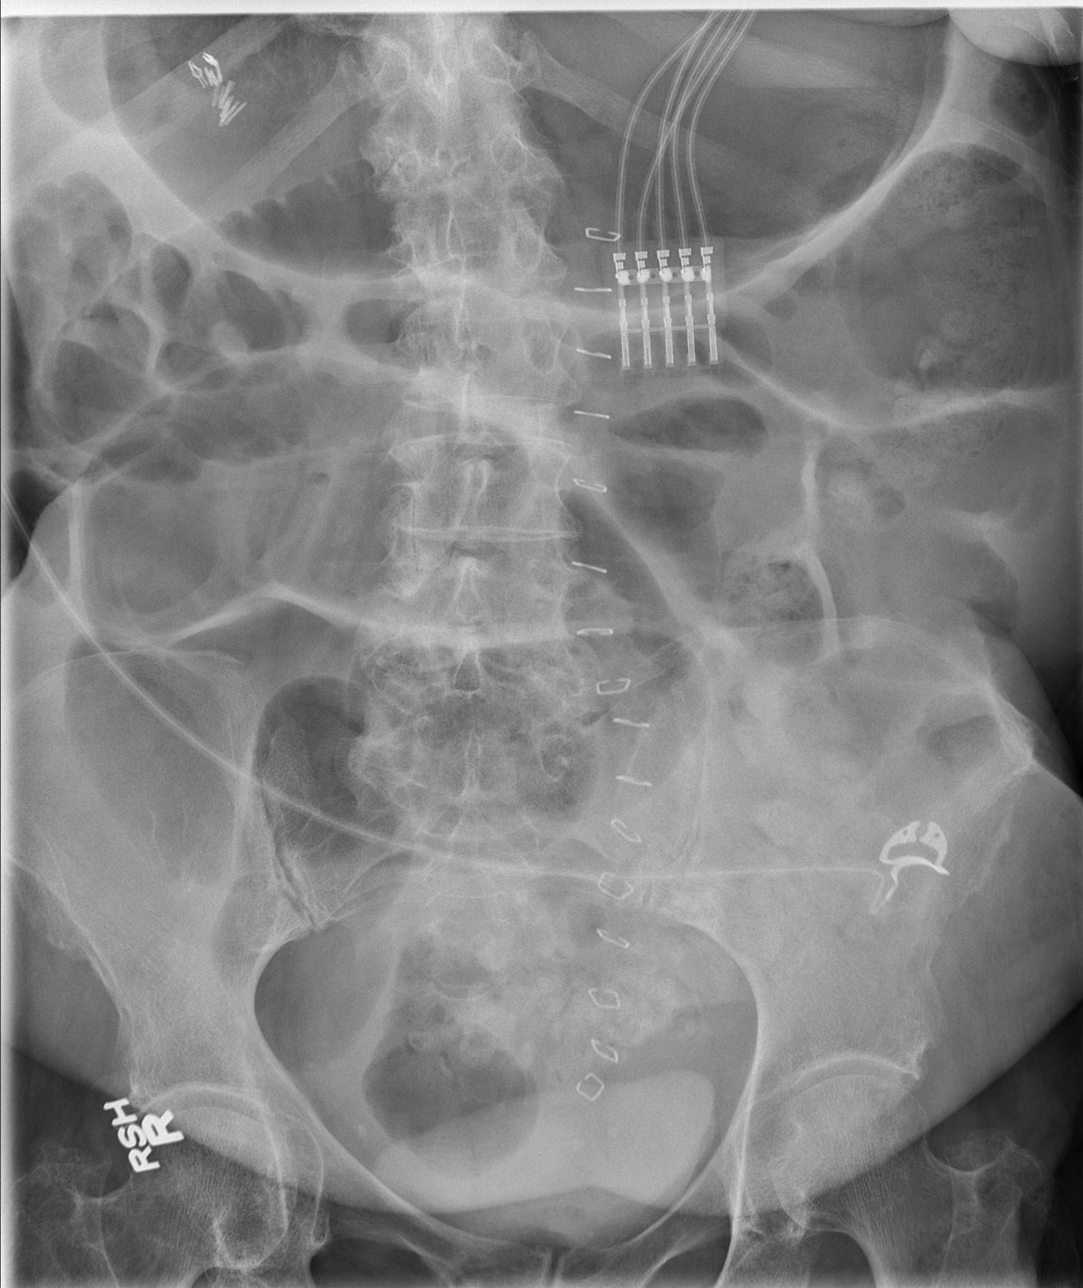

[t abdomen supine (2 of 2)]
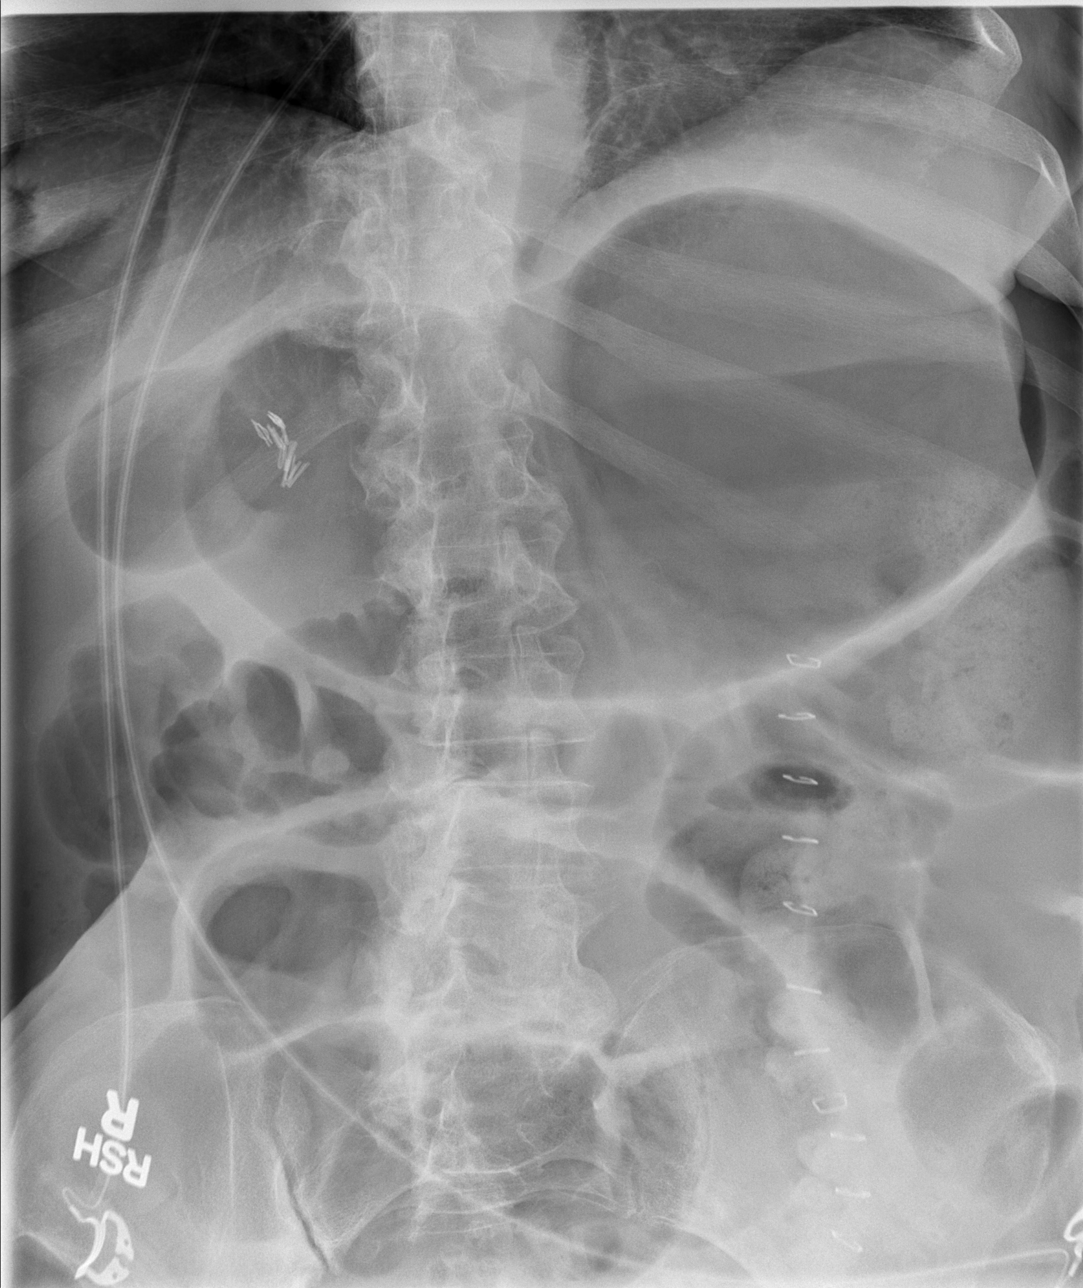

[3 of 3 positions shown; findings below may reference images not displayed]

FINDINGS: There are distended loops of small bowel throughout the abdomen with
air-fluid levels on the decubitus view. No colonic distention is
seen. Findings may reflect a diffuse small bowel adynamic ileus.
Partial obstruction is possible. There is no free air.

Surgical staples lie along the anterior abdominal wall midline.
Changes from a cholecystectomy are stable.
IMPRESSION: Distended loops of small bowel with air-fluid levels consistent with
either a diffuse small bowel adynamic ileus or a partial
obstruction. No free air.
